# Patient Record
Sex: Female | Born: 1935 | Race: Black or African American | Hispanic: No | Smoking: Never smoker
Health system: Southern US, Community
[De-identification: ages and names within clinical notes are randomized; demographics above are authoritative.]

## PROBLEM LIST (undated history)

## (undated) DIAGNOSIS — I5030 Unspecified diastolic (congestive) heart failure: Secondary | ICD-10-CM

## (undated) DIAGNOSIS — Z923 Personal history of irradiation: Secondary | ICD-10-CM

## (undated) DIAGNOSIS — E119 Type 2 diabetes mellitus without complications: Secondary | ICD-10-CM

## (undated) DIAGNOSIS — I10 Essential (primary) hypertension: Secondary | ICD-10-CM

## (undated) DIAGNOSIS — M1990 Unspecified osteoarthritis, unspecified site: Secondary | ICD-10-CM

## (undated) HISTORY — DX: Unspecified osteoarthritis, unspecified site: M19.90

## (undated) HISTORY — PX: HERNIA REPAIR: SHX51

---

## 1958-06-27 HISTORY — PX: HYSTERECTOMY ABDOMINAL WITH SALPINGO-OOPHORECTOMY: SHX6792

## 1998-01-19 ENCOUNTER — Encounter: Admission: RE | Admit: 1998-01-19 | Discharge: 1998-01-19 | Payer: Self-pay | Admitting: Internal Medicine

## 1998-07-03 ENCOUNTER — Encounter: Admission: RE | Admit: 1998-07-03 | Discharge: 1998-07-03 | Payer: Self-pay | Admitting: Internal Medicine

## 1998-11-11 ENCOUNTER — Encounter: Admission: RE | Admit: 1998-11-11 | Discharge: 1998-11-11 | Payer: Self-pay | Admitting: Internal Medicine

## 1999-01-06 ENCOUNTER — Other Ambulatory Visit: Admission: RE | Admit: 1999-01-06 | Discharge: 1999-01-06 | Payer: Self-pay | Admitting: Gastroenterology

## 1999-01-06 ENCOUNTER — Encounter (INDEPENDENT_AMBULATORY_CARE_PROVIDER_SITE_OTHER): Payer: Self-pay | Admitting: Specialist

## 1999-06-03 ENCOUNTER — Encounter: Admission: RE | Admit: 1999-06-03 | Discharge: 1999-06-03 | Payer: Self-pay | Admitting: Hematology and Oncology

## 1999-09-01 ENCOUNTER — Encounter: Admission: RE | Admit: 1999-09-01 | Discharge: 1999-09-01 | Payer: Self-pay | Admitting: Hematology and Oncology

## 1999-11-18 ENCOUNTER — Encounter: Admission: RE | Admit: 1999-11-18 | Discharge: 1999-11-18 | Payer: Self-pay | Admitting: Internal Medicine

## 1999-12-02 ENCOUNTER — Ambulatory Visit (HOSPITAL_COMMUNITY): Admission: RE | Admit: 1999-12-02 | Discharge: 1999-12-02 | Payer: Self-pay | Admitting: Internal Medicine

## 1999-12-02 ENCOUNTER — Encounter: Admission: RE | Admit: 1999-12-02 | Discharge: 1999-12-02 | Payer: Self-pay | Admitting: Internal Medicine

## 1999-12-02 ENCOUNTER — Encounter: Payer: Self-pay | Admitting: Internal Medicine

## 1999-12-30 ENCOUNTER — Encounter: Admission: RE | Admit: 1999-12-30 | Discharge: 1999-12-30 | Payer: Self-pay | Admitting: Hematology and Oncology

## 2000-04-17 ENCOUNTER — Encounter: Admission: RE | Admit: 2000-04-17 | Discharge: 2000-04-17 | Payer: Self-pay | Admitting: Internal Medicine

## 2000-07-14 ENCOUNTER — Encounter: Admission: RE | Admit: 2000-07-14 | Discharge: 2000-07-14 | Payer: Self-pay | Admitting: Hematology and Oncology

## 2000-07-18 ENCOUNTER — Encounter: Admission: RE | Admit: 2000-07-18 | Discharge: 2000-07-18 | Payer: Self-pay | Admitting: Internal Medicine

## 2000-10-05 ENCOUNTER — Encounter: Admission: RE | Admit: 2000-10-05 | Discharge: 2000-10-05 | Payer: Self-pay

## 2000-12-07 ENCOUNTER — Encounter: Admission: RE | Admit: 2000-12-07 | Discharge: 2000-12-07 | Payer: Self-pay | Admitting: Internal Medicine

## 2000-12-18 ENCOUNTER — Encounter: Admission: RE | Admit: 2000-12-18 | Discharge: 2000-12-18 | Payer: Self-pay | Admitting: Internal Medicine

## 2000-12-21 ENCOUNTER — Ambulatory Visit (HOSPITAL_COMMUNITY): Admission: RE | Admit: 2000-12-21 | Discharge: 2000-12-21 | Payer: Self-pay | Admitting: Internal Medicine

## 2000-12-21 ENCOUNTER — Encounter: Payer: Self-pay | Admitting: Internal Medicine

## 2001-01-08 ENCOUNTER — Encounter: Admission: RE | Admit: 2001-01-08 | Discharge: 2001-01-08 | Payer: Self-pay | Admitting: Internal Medicine

## 2001-02-09 ENCOUNTER — Encounter: Admission: RE | Admit: 2001-02-09 | Discharge: 2001-02-09 | Payer: Self-pay | Admitting: Internal Medicine

## 2001-02-23 ENCOUNTER — Encounter: Payer: Self-pay | Admitting: Emergency Medicine

## 2001-02-23 ENCOUNTER — Inpatient Hospital Stay (HOSPITAL_COMMUNITY): Admission: EM | Admit: 2001-02-23 | Discharge: 2001-02-24 | Payer: Self-pay | Admitting: Emergency Medicine

## 2001-05-23 ENCOUNTER — Encounter: Admission: RE | Admit: 2001-05-23 | Discharge: 2001-05-23 | Payer: Self-pay | Admitting: Internal Medicine

## 2001-06-27 HISTORY — PX: HERNIA REPAIR: SHX51

## 2001-11-12 ENCOUNTER — Encounter: Admission: RE | Admit: 2001-11-12 | Discharge: 2001-11-12 | Payer: Self-pay | Admitting: Internal Medicine

## 2001-12-20 ENCOUNTER — Ambulatory Visit (HOSPITAL_COMMUNITY): Admission: RE | Admit: 2001-12-20 | Discharge: 2001-12-20 | Payer: Self-pay | Admitting: Internal Medicine

## 2002-04-15 ENCOUNTER — Encounter: Admission: RE | Admit: 2002-04-15 | Discharge: 2002-04-15 | Payer: Self-pay | Admitting: Internal Medicine

## 2002-12-02 ENCOUNTER — Encounter: Admission: RE | Admit: 2002-12-02 | Discharge: 2002-12-02 | Payer: Self-pay | Admitting: Internal Medicine

## 2004-08-03 ENCOUNTER — Encounter: Admission: RE | Admit: 2004-08-03 | Discharge: 2004-08-03 | Payer: Self-pay | Admitting: Internal Medicine

## 2006-08-20 ENCOUNTER — Inpatient Hospital Stay (HOSPITAL_COMMUNITY): Admission: EM | Admit: 2006-08-20 | Discharge: 2006-08-22 | Payer: Self-pay | Admitting: Emergency Medicine

## 2006-09-08 ENCOUNTER — Inpatient Hospital Stay (HOSPITAL_COMMUNITY): Admission: RE | Admit: 2006-09-08 | Discharge: 2006-09-10 | Payer: Self-pay | Admitting: General Surgery

## 2006-11-13 ENCOUNTER — Encounter: Admission: RE | Admit: 2006-11-13 | Discharge: 2006-11-13 | Payer: Self-pay | Admitting: Orthopedic Surgery

## 2010-07-17 ENCOUNTER — Encounter: Payer: Self-pay | Admitting: Internal Medicine

## 2010-11-12 NOTE — H&P (Signed)
Megan Ryan, Megan Ryan               ACCOUNT NO.:  [id number]   MEDICAL RECORD NO.:  [id number]          PATIENT TYPE:  INP   LOCATION:  1618                         FACILITY:  [facility]   PHYSICIAN:  Adolph Pollack, M.D.DATE OF BIRTH:  1936/01/22   DATE OF ADMISSION:  09/08/2006  DATE OF DISCHARGE:                              HISTORY & PHYSICAL   REASON FOR ADMISSION:  Elective repair of ventral incisional hernia.   HISTORY OF PRESENT ILLNESS:  Ms. Sokolow is a 75 year old female who has  had lower abdominal surgeries in the past.  She was admitted on  February23,as she was having some sharp lower abdominal pain with  dry heaves.  She underwent a CT scan which demonstrated a ventral  incisional hernia containing some small bowel with the point of small-  bowel obstruction being at this level.  A Nasogastric tube was placed.  She was seen in the emergency department; and on examination the hernia  was reduced.  She subsequently was observed, briefly, in the hospital  and did well; and now has come back for elective procedure having had a  gentle bowel prep.   PAST MEDICAL HISTORY:  1. Hypertension.  2. Degenerative joint disease.   PREVIOUS OPERATIONS:  1. Abdominal oophorectomy.  2. Abdominal hysterectomy.   ALLERGIES:  None.   MEDICATIONS:  HCTZ and Darvocet.   SOCIAL HISTORY:  She lives alone.  She is a TEFL teacher Witness and does  not wish to receive blood transfusions under any circumstances.  No  tobacco, alcohol use.   REVIEW OF SYSTEMS:  Essentially unremarkable with respect to cardiac  disease, chronic pulmonary disease, or diabetes.   PHYSICAL EXAM:  GENERAL:  An obese female who is in no acute distress,  pleasant, and cooperative.  EYES:  Extraocular motions intact.  There is no icterus.  LUNGS:  Clear auscultation.  CARDIOVASCULAR:  Heart demonstrates a regular rate, regular rhythm, no  murmur heard.  ABDOMEN:  Obese.  There is a fairly significant size  pannus.  There is  lower midline incision well healed.  Below the level of the umbilicus  and just to the left, there is a fullness that is reducible.  EXTREMITIES:  SCDs hose are on.   IMPRESSION:  Ventral incisional hernia with one episode of incarceration  and bowel obstruction that resolved rapidly after reduction of the  hernia.   PLAN:  Laparoscopic, possible open repair.  I have discussed the  procedure, and the risks with her at length.  The risks; include, but  are not limited to bleeding, infection, wound healing problems,  anesthesia, recurrence, and accidental damage to the intra-abdominal  organs.      Adolph Pollack, M.D.  Electronically Signed     TJR/MEDQ  D:  09/08/2006  T:  09/09/2006  Job:  [id number]

## 2010-11-12 NOTE — Discharge Summary (Signed)
NAMENGOC, DETJEN               ACCOUNT NO.:  [id number]   MEDICAL RECORD NO.:  [id number]          PATIENT TYPE:  INP   LOCATION:  1618                         FACILITY:  [facility]   PHYSICIAN:  Adolph Pollack, M.D.DATE OF BIRTH:  [birth year]   DATE OF ADMISSION:  09/08/2006  DATE OF DISCHARGE:  09/10/2006                               DISCHARGE SUMMARY   PRINCIPAL DISCHARGE DIAGNOSIS:  Ventral incisional hernia.   SECONDARY DIAGNOSES:  1. Hypertension.  2. Degenerative joint disease.   PROCEDURE:  Laparoscopic repair of incarcerated ventral incisional  hernia with mesh.   INDICATION:  Ms. Tilson is a 75 year old female who went to the  hospital with some abdominal pain and nausea and vomiting back in  February and was felt to potentially have an acutely incarcerated  incisional hernia, which was reduced in the emergency department.  She  subsequently was observed and was able to go home and have a bowel prep  and now returns for an elective operation.   HOSPITAL COURSE:  She underwent the above laparoscopic repair of an  incarcerated ventral incisional hernia on September 08, 2006.  She tolerated  this well.  The first post-op day, she still required intravenous pain  medication.  However, by the second day, she was improved, able to have  her pain controlled by oral medication and was able to be discharged.   DISPOSITION:  Discharged to home in satisfactory condition on September 10, 2006.  She was given discharge instructions.  She is to continue her  usual medications and was given a prescription for Tylox for pain.  She  will call if she has any problems and then also to make an appointment  in 2 weeks for followup.      Adolph Pollack, M.D.  Electronically Signed     TJR/MEDQ  D:  09/27/2006  T:  09/27/2006  Job:  [id number]   cc:   Fleet Contras, M.D.  Fax: [phone]

## 2010-11-12 NOTE — Discharge Summary (Signed)
[name], Megan Ryan               ACCOUNT NO.:  [id number]   MEDICAL RECORD NO.:  [id number]          PATIENT TYPE:  INP   LOCATION:  5029                         FACILITY:  [facility]   PHYSICIAN:  Adolph Pollack, M.D.DATE OF BIRTH:  04-Mar-1936   DATE OF ADMISSION:  08/19/2006  DATE OF DISCHARGE:  08/22/2006                               DISCHARGE SUMMARY   PRINCIPAL DISCHARGE DIAGNOSIS:  Partial small bowel obstruction  secondary to incarcerated ventral hernia.   SECONDARY DIAGNOSES:  1. Obesity.  2. Hypertension.  3. Degenerative joint disease.   PROCEDURES:  None.   REASON FOR ADMISSION:  This 75 year old female had been doing some  housework and pushing a vacuum when on August 19, 2006 at 3 p.m. she  had abrupt onset of abdominal pain with dry heaves and a very firm area  to the left of the umbilicus.  She presented to the hospital at [facility] where she was evaluated, and a CT scan demonstrated small bowel  obstruction secondary to incarcerated ventral hernia.  Dr. Darnell Level  saw her in the emergency department and with gentle palpation reduced  the hernia, and the patient's symptoms immediately resolved.  She was  watched overnight and the next and started on a liquid diet and advanced  to a solid diet.  I have offered to repair her hernia here in the  hospital or let her go home and have it repaired laparoscopically  electively.  On August 21, 2006, she chose to go ahead and go home and  come back for elective repair.  However, the next morning she thought  she may have changed her mind and might want the repair done while she  was here.  However, I had gone ahead and given her a regular diet, and  it was explained to her I would need a two-day bowel preparation 2-1/2  hours, and I felt that now this is more of an elective issue than an  urgent issue.  She is completely asymptomatic, tolerating her diet and  moving her bowels.  I had a long discussion with her,  and I went over  the procedure and the risks of a laparoscopic possible open ventral  hernia repair.  The risks include but are not limited to bleeding,  infection, wound healing problem, anesthesia, recurrence, and accidental  damage to intra-abdominal organs.  We also discussed her having a gentle  two-day bowel preparation with clear liquids for two days followed by a  bottle of magnesium citrate two days beforehand and one bottle b.i.d.  the day before surgery.  I have explained this to her in detail with her  instructions.  I told her I would have my office call with the scheduled  date.  If she has any recurrent symptoms, she is to call right away.   DISPOSITION:  Discharged home in satisfactory condition.  Activity is  limited to no heavy lifting over ten pounds, no indoor or outdoor work.  Diet and home medications are the same.  She is told to call for any  problems or questions.  Adolph Pollack, M.D.  Electronically Signed    TJR/MEDQ  D:  08/22/2006  T:  08/22/2006  Job:  [id number]

## 2010-11-12 NOTE — Op Note (Signed)
Megan Ryan, Megan Ryan               ACCOUNT NO.:  [id number]   MEDICAL RECORD NO.:  [id number]          PATIENT TYPE:  INP   LOCATION:  1618                         FACILITY:  [facility]   PHYSICIAN:  Adolph Pollack, M.D.DATE OF BIRTH:  Oct 10, 1935   DATE OF PROCEDURE:  09/08/2006  DATE OF DISCHARGE:                               OPERATIVE REPORT   PREOPERATIVE DIAGNOSIS:  Incarcerated ventral incisional hernia.   POSTOPERATIVE DIAGNOSIS:  Incarcerated ventral incisional hernia.   PROCEDURE:  Laparoscopic repair of incarcerated ventral incisional  hernia with Proceed with mesh.   SURGEON:  Adolph Pollack, M.D.   ASSISTANT:  Currie Paris, M.D.   ANESTHESIA:  General.   INDICATIONS:  A 75 year old female with an incarcerated ventral  incisional hernia that had been reduced.  She now presents for  laparoscopic repair.   TECHNIQUE:  She is seen in the holding area and brought to the operating  room and placed supine on the operating table.  A general anesthetic was  administered.  A Foley catheter was placed in the bladder.  The  abdominal wall was sterilely prepped and draped.  In the right upper  quadrant I made an incision through the skin and subcutaneous tissue.  She was very obese.  I made an incision in two fascial layers and then I  placed an 11 mm trocar with an OptiVu and the laparoscope and trocar and  gently worked my way through the next two layers entering the peritoneal  cavity.  I then insufflated CO2 gas.  I inspected the area underneath  the trocar very well and did not notice any solid organ or intestinal  injury.  Following this, I noted the hernia defect which was in the left  periumbilical region.  There were also adhesions from a lower midline  incision to the anterior abdominal wall.  A 5 mm trocar was then placed  in the right lower quadrant and using sharp dissection, I began lysing  adhesions and exposing the area of the hernia.  I then placed  a 5 mm  trocar in the left upper quadrant.  I used the harmonic scalpel to  divide some of the omentum and control some bleeding from the omentum.  The bowel had already been reduced and basically, there was omentum  incarcerated up in the hernia which I reduced.  I then divided the sac  with the harmonic scalpel exposing a normal fascial area around the  hernia.  This took about one hour to perform the adhesiolysis.   I then inspected the area and saw no intestinal injury and no bleeding  from the omentum.  Spinal needles were then placed around the edges of  the hernia in four quadrants and 3-4 cm marked back to them creating an  adequate overlap.  A piece of Proceed mesh was brought into the field  and cut to the appropriate size.  Four anchoring sutures of #1 Novofil  were placed in four quadrants of the Proceed mesh.  The Proceed mesh was  then placed into the abdominal cavity with the blue side facing  up and  the nonadherent side facing the viscera.  Four stab wounds were made in  the four quadrants around the periumbilical region.  The anchoring  sutures were then pulled up across the fascial bridge and tied down,  initially anchoring the mesh to the fascia.  Using the spiral tacker, I  then anchored the periphery of the mesh to the muscle with both an inner  and outer layer.  This more than adequately covered the defect with  adequate overlap.   I reinspected the abdominal cavity and no bleeding was noted and no  visceral injury was noted.  I then removed all but one trocar and  watched the mesh approximate basically omentum.  That trocar was removed  and pneumoperitoneum was released.  The skin incisions were then all  closed with 4-0 Monocryl subcuticular stitches.  Steri-Strips and  sterile dressings were applied.  She tolerated the procedure well  without any apparent complications and was taken to recovery in  satisfactory condition.      Adolph Pollack, M.D.   Electronically Signed     TJR/MEDQ  D:  09/08/2006  T:  09/09/2006  Job:  [id number]

## 2010-11-12 NOTE — H&P (Signed)
NAMEEILENE, VOIGT               ACCOUNT NO.:  [id number]   MEDICAL RECORD NO.:  [id number]          PATIENT TYPE:  EMS   LOCATION:  URG                          FACILITY:  [facility]   PHYSICIAN:  Velora Heckler, MD      DATE OF BIRTH:  June 11, 1936   DATE OF ADMISSION:  08/19/2006  DATE OF DISCHARGE:                              HISTORY & PHYSICAL   CHIEF COMPLAINT:  Abdominal pain, nausea, vomiting, incisional hernia.   HISTORY OF PRESENT ILLNESS:  Megan Ryan is a 75 year old black female  from [place], [place] who presents to the emergency department  with onset of sharp abdominal pain.  She had initial pain approximately  3:00 p.m. on February 23.  She had not experienced this discomfort in  the past.  Pain persisted and was followed by onset of dry heaves.  She  denies fevers or chills.  The patient presented to [facility] for assessment.  She was sent to the hospital for a CT scan abdomen  and pelvis which demonstrated a ventral incisional hernia containing  small bowel with small-bowel obstruction.  Nasogastric tube was placed  and general surgery was consulted.   PAST MEDICAL HISTORY:  Status post abdominal hysterectomy 1972, status  post oophorectomy in 1962, history of hypertension, history of  degenerative joint disease.   MEDICATIONS:  Hydrochlorothiazide and Darvocet.   ALLERGIES:  None known.   PRIMARY PHYSICIAN:  Fleet Contras, M.D.   SOCIAL HISTORY:  The patient lives alone in [place].  She does not  smoke.  She does not drink alcohol.  She is a Scientist, product/process development.  She  acknowledges that she does not wish to receive blood transfusions under  any circumstances.   FAMILY HISTORY:  Noncontributory.   REVIEW OF SYSTEMS:  A 15-system review without other significant  finding.  Specifically no history of diabetes.  No history of cardiac  disease.   PHYSICAL EXAMINATION:  GENERAL:  A 75 year old morbidly obese black  female on a stretcher  in the emergency department.  VITAL SIGNS:  Temperature 97.2, pulse 58, respirations 18, blood  pressure 115/62.  HEENT:  Shows her to be normocephalic, atraumatic.  Sclerae clear.  Conjunctiva clear.  Pupils equal and reactive.  Dentition fair.  Mucous  membranes moist.  Nasogastric tube is in the right naris with a small  amount of greenish fluid present within the tube.  LUNGS:  Clear to auscultation bilaterally.  CARDIAC EXAM:  Shows regular rate and rhythm without murmur.  Peripheral  pulses are full.  ABDOMEN:  Obese.  There is a large pannus.  There is a well-healed lower  incision.  Palpation reveals a mass just to the left and below the level  of the umbilicus.  This is mildly tender.  With gentle manipulation and  compression.  There is a rapid decompression of the mass, with full  resolution, representing reduction of the hernia.  This is followed by  relief of discomfort.  The remainder of the abdomen is soft and  nontender.  Bowel sounds are present on auscultation.  EXTREMITIES:  Nontender without edema.  NEUROLOGICALLY:  The patient is alert and oriented without focal  neurologic deficit.   LABORATORY STUDIES:  White count 7.4, hemoglobin 13.7, hematocrit 41.8%,  platelet count 233,000.  Differential is normal.  Liver function tests  are normal.   RADIOGRAPHIC STUDIES:  CT scan abdomen and pelvis reviewed in the  emergency department showing ventral incisional hernia containing small  bowel with a transition point at the level of the hernia with proximal  dilatation and fluid within the small bowel.  No sign of perforation.  No free air.   IMPRESSION:  Ventral incisional hernia with small-bowel obstruction.   PLAN:  Hernia was manually reduced in the emergency department with  symptomatic improvement.  The patient will be admitted on the general  surgical service.  Nasogastric tube will be kept to suction.  She will  be maintained n.p.o. and receive intravenous  fluid hydration.  Repeat  abdominal examination will be performed.  If the patient appears to have  resolution of her small-bowel obstruction.  She may progress and be  allowed to be discharged and then return for hernia repair on a semi-  elective basis.  If the patient's symptoms persist, or if she  deteriorates clinically, she will require urgent operation during this  hospitalization.  I discussed this with the patient.  She understands  and is in agreement with this plan.      Velora Heckler, MD  Electronically Signed     TMG/MEDQ  D:  08/20/2006  T:  08/20/2006  Job:  [id number]   cc:   Fleet Contras, M.D.

## 2011-11-18 ENCOUNTER — Emergency Department (HOSPITAL_COMMUNITY)
Admission: EM | Admit: 2011-11-18 | Discharge: 2011-11-18 | Disposition: A | Discharge status: Home / Self Care | Payer: Medicare Other | Attending: Emergency Medicine | Admitting: Emergency Medicine

## 2011-11-18 ENCOUNTER — Encounter (HOSPITAL_COMMUNITY): Payer: Self-pay | Admitting: Emergency Medicine

## 2011-11-18 DIAGNOSIS — N898 Other specified noninflammatory disorders of vagina: Secondary | ICD-10-CM | POA: Insufficient documentation

## 2011-11-18 DIAGNOSIS — N939 Abnormal uterine and vaginal bleeding, unspecified: Secondary | ICD-10-CM

## 2011-11-18 LAB — OCCULT BLOOD, POC DEVICE: Fecal Occult Bld: NEGATIVE

## 2011-11-18 LAB — CBC
HCT: 38.3 % (ref 36.0–46.0)
Hemoglobin: 12.2 g/dL (ref 12.0–15.0)
MCHC: 31.9 g/dL (ref 30.0–36.0)
WBC: 5.4 10*3/uL (ref 4.0–10.5)

## 2011-11-18 LAB — BASIC METABOLIC PANEL
CO2: 27 mEq/L (ref 19–32)
Calcium: 9.7 mg/dL (ref 8.4–10.5)
Creatinine, Ser: 0.81 mg/dL (ref 0.50–1.10)
GFR calc Af Amer: 81 mL/min — ABNORMAL LOW (ref >90)

## 2011-11-18 LAB — PROTIME-INR: Prothrombin Time: 13.3 seconds (ref 11.6–15.2)

## 2011-11-18 NOTE — ED Notes (Signed)
Started 2 weeks ago with LLQ Abdominal pain. Pain increased with lifting, comes and goes. Yesterday had pink vaginal bleeding. C/O nausea with out vomiting. Last BM yesterday, normal.

## 2011-11-18 NOTE — ED Notes (Signed)
Pt discharged in good condition.

## 2011-11-18 NOTE — ED Provider Notes (Signed)
History     CSN: [id number]  Arrival date & time 11/18/11  0917   First MD Initiated Contact with Patient 11/18/11 [phone]      Chief Complaint  Patient presents with  . Abdominal Pain    nausea     The history is provided by the patient.   the patient reports she's had intermittent left-sided abdominal pain for about 2 weeks.  Currently she is without any pain.  At this time she has no abdominal pain.  She reports 2 days ago she had heavy vaginal bleeding which is abnormal for her and she feels certain this was not coming from the rectum.  She reports she is status post total hysterectomy in the 1970s.  She denies melena or hematochezia.  She's had no blood in her stool.  She reports yesterday she had a pink discharge from her vagina.  She denies vaginal pain or vaginal itching.  She denies nausea vomiting.  She's had no weight changes.  She has not discussed this with the GYN or her primary care physician  No past medical history on file.  No past surgical history on file.  No family history on file.  History  Substance Use Topics  . Smoking status: Never Smoker   . Smokeless tobacco: Not on file  . Alcohol Use: No    OB History    Grav Para Term Preterm Abortions TAB SAB Ect Mult Living                  Review of Systems  Gastrointestinal: Positive for abdominal pain.  All other systems reviewed and are negative.    Allergies  Review of patient's allergies indicates no known allergies.  Home Medications   Current Outpatient Rx  Name Route Sig Dispense Refill  . ASPIRIN EC 81 MG PO TBEC Oral Take 81 mg by mouth daily.    Marland Kitchen GABAPENTIN 100 MG PO CAPS Oral Take 100 mg by mouth 2 (two) times daily.    . TRIAMTERENE-HCTZ 37.5-25 MG PO TABS Oral Take 0.5 tablets by mouth daily.      BP 165/66  Pulse 72  Temp(Src) 97.7 F (36.5 C) (Oral)  Resp 18  SpO2 100%  Physical Exam  Nursing note and vitals reviewed. Constitutional: She is oriented to person, place, and  time. She appears well-developed and well-nourished. No distress.  HENT:  Head: Normocephalic and atraumatic.  Eyes: EOM are normal.  Neck: Normal range of motion.  Cardiovascular: Normal rate, regular rhythm and normal heart sounds.   Pulmonary/Chest: Effort normal and breath sounds normal.  Abdominal: Soft. She exhibits no distension. There is no tenderness.  Genitourinary:       Normal external genitalia.  Normal speculum exam of the vaginal vault.  The cervix is surgically absent.  No obvious bleeding masses or friable lesions noted.  No blood in vaginal vault.  Rectal exam with normal brown Hemoccult-negative stool.  No gross blood.  No hemorrhoids noted.  Musculoskeletal: Normal range of motion.  Neurological: She is alert and oriented to person, place, and time.  Skin: Skin is warm and dry.  Psychiatric: She has a normal mood and affect. Judgment normal.    ED Course  Procedures (including critical care time)  Labs Reviewed  BASIC METABOLIC PANEL - Abnormal; Notable for the following:    GFR calc non Af Amer 70 (*)    GFR calc Af Amer 81 (*)    All other components within normal limits  CBC  PROTIME-INR  OCCULT BLOOD, POC DEVICE   No results found.   1. Vaginal bleeding       MDM  Unclear etiology of the patient's reported vaginal bleeding the other day.  She no longer has abdominal pain.  She is status post total hysterectomy.  Vaginal and rectal exams in the emergency department are nonrevealing.  The patient be referred to gynecology for followup.  No indication at this time for aggressive imaging.  Imaging her abdomen and pelvis can be completed as an outpatient as deemed necessary by GYN and her PCP        Lyanne Co, MD 11/18/11 1300

## 2011-11-18 NOTE — ED Notes (Signed)
Hx of  Left inguinal hernia repair in 2008 by Dr. Abbey Chatters.

## 2013-03-19 ENCOUNTER — Emergency Department (INDEPENDENT_AMBULATORY_CARE_PROVIDER_SITE_OTHER)
Admission: EM | Admit: 2013-03-19 | Discharge: 2013-03-19 | Disposition: A | Discharge status: Nursing Home (Includes ICF) | Payer: Medicare Other | Source: Home / Self Care | Attending: Emergency Medicine | Admitting: Emergency Medicine

## 2013-03-19 ENCOUNTER — Encounter (HOSPITAL_COMMUNITY): Payer: Self-pay | Admitting: Emergency Medicine

## 2013-03-19 ENCOUNTER — Emergency Department (HOSPITAL_COMMUNITY): Payer: Medicare Other

## 2013-03-19 ENCOUNTER — Emergency Department (HOSPITAL_COMMUNITY)
Admission: EM | Admit: 2013-03-19 | Discharge: 2013-03-19 | Disposition: A | Discharge status: Home / Self Care | Payer: Medicare Other | Attending: Emergency Medicine | Admitting: Emergency Medicine

## 2013-03-19 DIAGNOSIS — R05 Cough: Secondary | ICD-10-CM

## 2013-03-19 DIAGNOSIS — Z7982 Long term (current) use of aspirin: Secondary | ICD-10-CM | POA: Insufficient documentation

## 2013-03-19 DIAGNOSIS — Z79899 Other long term (current) drug therapy: Secondary | ICD-10-CM | POA: Insufficient documentation

## 2013-03-19 DIAGNOSIS — J40 Bronchitis, not specified as acute or chronic: Secondary | ICD-10-CM

## 2013-03-19 DIAGNOSIS — I10 Essential (primary) hypertension: Secondary | ICD-10-CM | POA: Insufficient documentation

## 2013-03-19 DIAGNOSIS — R062 Wheezing: Secondary | ICD-10-CM | POA: Insufficient documentation

## 2013-03-19 HISTORY — DX: Essential (primary) hypertension: I10

## 2013-03-19 LAB — CBC WITH DIFFERENTIAL/PLATELET
Basophils Absolute: 0 10*3/uL (ref 0.0–0.1)
Eosinophils Relative: 1 % (ref 0–5)
Hemoglobin: 10.6 g/dL — ABNORMAL LOW (ref 12.0–15.0)
Lymphocytes Relative: 23 % (ref 12–46)
Neutro Abs: 5.4 10*3/uL (ref 1.7–7.7)
Platelets: 242 10*3/uL (ref 150–400)
RBC: 3.55 MIL/uL — ABNORMAL LOW (ref 3.87–5.11)
RDW: 14.3 % (ref 11.5–15.5)
WBC: 8.2 10*3/uL (ref 4.0–10.5)

## 2013-03-19 LAB — PRO B NATRIURETIC PEPTIDE: Pro B Natriuretic peptide (BNP): 22.4 pg/mL (ref 0–450)

## 2013-03-19 LAB — POCT I-STAT TROPONIN I: Troponin i, poc: 0 ng/mL (ref 0.00–0.08)

## 2013-03-19 LAB — COMPREHENSIVE METABOLIC PANEL
ALT: 30 U/L (ref 0–35)
AST: 25 U/L (ref 0–37)
CO2: 27 mEq/L (ref 19–32)
Calcium: 9.5 mg/dL (ref 8.4–10.5)
Creatinine, Ser: 0.72 mg/dL (ref 0.50–1.10)
GFR calc non Af Amer: 82 mL/min — ABNORMAL LOW (ref >90)
Sodium: 137 mEq/L (ref 135–145)

## 2013-03-19 MED ORDER — NITROGLYCERIN 0.4 MG SL SUBL
SUBLINGUAL_TABLET | SUBLINGUAL | Status: AC
  Filled 2013-03-19: qty 25

## 2013-03-19 MED ORDER — POTASSIUM CHLORIDE CRYS ER 20 MEQ PO TBCR
60.0000 meq | EXTENDED_RELEASE_TABLET | Freq: Once | ORAL | Status: AC
  Administered 2013-03-19: 60 meq via ORAL
  Filled 2013-03-19: qty 3

## 2013-03-19 MED ORDER — ASPIRIN 81 MG PO CHEW
CHEWABLE_TABLET | ORAL | Status: AC
  Filled 2013-03-19: qty 4

## 2013-03-19 MED ORDER — ALBUTEROL SULFATE HFA 108 (90 BASE) MCG/ACT IN AERS: 1.0000 | INHALATION_SPRAY | Freq: Four times a day (QID) | RESPIRATORY_TRACT | Status: DC | PRN

## 2013-03-19 MED ORDER — PREDNISONE 10 MG PO TABS: 40.0000 mg | ORAL_TABLET | Freq: Every day | ORAL | Status: AC

## 2013-03-19 MED ORDER — SODIUM CHLORIDE 0.9 % IV SOLN
INTRAVENOUS | Status: DC
  Administered 2013-03-19: 50 mL via INTRAVENOUS

## 2013-03-19 MED ORDER — PREDNISONE 20 MG PO TABS
40.0000 mg | ORAL_TABLET | ORAL | Status: AC
Start: 2013-03-19 — End: 2013-03-19
  Administered 2013-03-19: 40 mg via ORAL
  Filled 2013-03-19: qty 2

## 2013-03-19 MED ORDER — ASPIRIN 81 MG PO CHEW
324.0000 mg | CHEWABLE_TABLET | Freq: Once | ORAL | Status: AC
  Administered 2013-03-19: 324 mg via ORAL

## 2013-03-19 MED ORDER — ALBUTEROL SULFATE (5 MG/ML) 0.5% IN NEBU
2.5000 mg | INHALATION_SOLUTION | Freq: Once | RESPIRATORY_TRACT | Status: AC
  Administered 2013-03-19: 2.5 mg via RESPIRATORY_TRACT
  Filled 2013-03-19: qty 0.5

## 2013-03-19 MED ORDER — NITROGLYCERIN 0.4 MG SL SUBL
0.4000 mg | SUBLINGUAL_TABLET | SUBLINGUAL | Status: AC | PRN
  Administered 2013-03-19: 0.4 mg via SUBLINGUAL

## 2013-03-19 NOTE — ED Notes (Signed)
Report given to [facility] and to [place] Engineer, manufacturing systems)

## 2013-03-19 NOTE — ED Notes (Signed)
Pt c/o cough and chest tightness x 6 days. Pt went to urgent care and EKG showed T wave inversion so pt was sent here. Pt denies any pain, only has sob when she coughs along with the tightness. Pt was given 1 Nitro and 324 ASA. 20g RH.

## 2013-03-19 NOTE — ED Provider Notes (Signed)
CSN: [id number]     Arrival date & time 03/19/13  1209 History   First MD Initiated Contact with Patient 03/19/13 1217     Chief Complaint  Patient presents with  . Chest Pain   (Consider location/radiation/quality/duration/timing/severity/associated sxs/prior Treatment) HPI Patient presents from urgent care where she initially presented with concerns of cough and chest tightness. Symptoms began 6 days ago without clear precipitant.  Since onset symptoms have been persistent, with no relief from OTC medication or any activity. The chest pressure is mostly associated with coughing, but is present throughout. Pressures anterior, tight. There is no exertional or pleuritic complaints. There is no associated lightheadedness, syncope, vomiting, diarrhea, though there is urinary frequency. Patient states that she was in her usual state of health prior to the onset of symptoms. She denies a history of cardiac or pulmonary disease.  Past Medical History  Diagnosis Date  . Hypertension    History reviewed. No pertinent past surgical history. No family history on file. History  Substance Use Topics  . Smoking status: Never Smoker   . Smokeless tobacco: Not on file  . Alcohol Use: No   OB History   Grav Para Term Preterm Abortions TAB SAB Ect Mult Living                 Review of Systems  Constitutional:       Per HPI, otherwise negative  HENT:       Per HPI, otherwise negative  Respiratory:       Per HPI, otherwise negative  Cardiovascular:       Per HPI, otherwise negative  Gastrointestinal: Negative for vomiting.  Endocrine:       Negative aside from HPI  Genitourinary:       Neg aside from HPI   Musculoskeletal:       Per HPI, otherwise negative  Skin: Negative.   Neurological: Negative for syncope.    Allergies  Review of patient's allergies indicates no known allergies.  Home Medications   Current Outpatient Rx  Name  Route  Sig  Dispense  Refill  . aspirin EC  81 MG tablet   Oral   Take 81 mg by mouth daily.         Marland Kitchen OVER THE COUNTER MEDICATION   Oral   Take 15 mLs by mouth 4 (four) times daily as needed (cold).         Marland Kitchen tobramycin (TOBREX) 0.3 % ophthalmic solution   Both Eyes   Place 1 drop into both eyes every 4 (four) hours.         . triamterene-hydrochlorothiazide (MAXZIDE-25) 37.5-25 MG per tablet   Oral   Take 0.5 tablets by mouth daily.          BP 123/72  Pulse 83  Temp(Src) 98.8 F (37.1 C) (Oral)  Resp 15  SpO2 100% Physical Exam  Nursing note and vitals reviewed. Constitutional: She is oriented to person, place, and time. She appears well-developed and well-nourished. No distress.  HENT:  Head: Normocephalic and atraumatic.  Eyes: Conjunctivae and EOM are normal.  Cardiovascular: Normal rate and regular rhythm.   Pulmonary/Chest: Effort normal. No stridor. No respiratory distress. She has decreased breath sounds. She has wheezes.  Abdominal: She exhibits no distension.  Musculoskeletal: She exhibits no edema.  Neurological: She is alert and oriented to person, place, and time. No cranial nerve deficit.  Skin: Skin is warm and dry.  Psychiatric: She has a normal mood and affect.  ED Course  Procedures (including critical care time) Labs Review Labs Reviewed  CBC WITH DIFFERENTIAL  COMPREHENSIVE METABOLIC PANEL  PRO B NATRIURETIC PEPTIDE  URINALYSIS, ROUTINE W REFLEX MICROSCOPIC   Imaging Review No results found. I reviewed the patient's chart from earlier today, including presentation to urgent care. Today, on cardiac monitor she has sinus rhythm, rate 85, normal Oximetry 97% on room air this is normal EKG has sinus rhythm, rate 91, T wave inversions anteriorly, with prominent P waves.  This is abnormal   4:27 PM On re-exam the patient is sitting upright, drinking water. BP 140/100. I discussed all findings with her and her family.  MDM  No diagnosis found. This is a pleasant female with  history of hypertension, but no history of CAD now presents with one week of cough, dyspnea, cough associated chest tightness.  On exam the patient is initially wheezing, but is not hypoxic or tachypneic.  Patient's labs are largely reassuring, though there is evidence of    hypokalemia.  Otherwise, troponin was negative, and with her symptoms for greater than one day this likely reflects the absence of ongoing coronary ischemia.  In addition, the patient has no significant chest pain is not associated with coughing. The patient's respiratory status was stable, and improved with albuterol.  I had a lengthy discussion with patient and her family members about the findings, the provisional diagnosis of bronchitis, but he did follow up with her primary care physician, both for appropriate management, and for outpatient coronary testing as she has none to date however, there is little suspicion for occult ACS given the absence of distress, and without evidence of pneumonia or other acute abnormalities, the patient is appropriate for further evaluation and management as an outpatient.    Gerhard Munch, MD 03/19/13 [phone]

## 2013-03-19 NOTE — ED Notes (Signed)
Pt states the Nitro she was given at urgent care has not relieved her chest tightness. Pt states she only has the tightness and sob when she coughs.

## 2013-03-19 NOTE — ED Provider Notes (Signed)
Chief Complaint:   Chief Complaint  Patient presents with  . Cough    History of Present Illness:   Megan Ryan is a 77 year old female with hypertension who has had a six-day history of a cough productive of white sputum, chest tightness, and wheezing. She also describes diaphoresis and some shortness of breath but no nausea and she has not had any chest pain. She has no cardiac history. She also has some nasal congestion, rhinorrhea, sore throat, watering of her eyes, and has had cough incontinence of urine. She states her urine has been thick but denies any burning. She denies any fever, chills, or GI symptoms.  Review of Systems:  Other than noted above, the patient denies any of the following symptoms. Systemic:  No fever, chills, sweats, or fatigue. ENT:  No nasal congestion, rhinorrhea, or sore throat. Pulmonary:  No cough, wheezing, shortness of breath, sputum production, hemoptysis. Cardiac:  No palpitations, rapid heartbeat, dizziness, presyncope or syncope. GI:  No abdominal pain, heartburn, nausea, or vomiting. Ext:  No leg pain or swelling.  PMFSH:  Past medical history, family history, social history, meds, and allergies were reviewed and updated as needed. She has no known medication allergies. She takes triamterene/HCTZ for high blood pressure and also has arthritis, asthma, and allergies.  Physical Exam:   Vital signs:  BP 132/62  Pulse 97  Temp(Src) 98.6 F (37 C) (Oral)  Resp 16  SpO2 99% Gen:  Alert, oriented, in no distress, skin warm and dry. Eye:  PERRL, lids and conjunctivas normal.  Sclera non-icteric. ENT:  Mucous membranes moist, pharynx clear. Neck:  Supple, no adenopathy or tenderness.  No JVD. Lungs:  Clear to auscultation, no wheezes, rales or rhonchi.  No respiratory distress. Heart:  Regular rhythm.  No gallops, murmers, clicks or rubs. Chest:  No chest wall tenderness. Abdomen:  Soft, nontender, no organomegaly or mass.  Bowel sounds normal.  No  pulsatile abdominal mass or bruit. Ext:  No edema.  No calf tenderness and Homann's sign negative.  Pulses full and equal. Skin:  Warm and dry.  No rash.  EKG:   Date: 03/19/2013  Rate: 91  Rhythm: normal sinus rhythm  QRS Axis: normal  Intervals: normal  ST/T Wave abnormalities: nonspecific T wave changes  Conduction Disutrbances:none  Narrative Interpretation: Normal sinus rhythm, she has Q waves in leads III and aVF. She also has T-wave inversions in leads V1 through V5.  Old EKG Reviewed: none available  Course in Urgent Care Center:   Started on normal saline IV at 50 mL per hour, she was given aspirin 325 mg by mouth and nitroglycerin 0.4 mg sublingually. She was started on oxygen at 2 L per minute via nasal cannula and monitor.  Assessment:  The encounter diagnosis was Cough.  Possible anginal equivalent.  Plan:   The patient was transferred to the ED via [facility] in stable condition.  Medical Decision Making:  77 year old female presents with 6 day history of productive cough, chest tightness, and wheezing.  No chest pain, has had diaphoresis, is short of breath, no nausea.  Her EKG showed T inversion in V1 through V5.  My suspicion is that this is an anginal equivalent.  We will give TNG, ASA, IV NS, O2, and moniter.        Reuben Likes, MD 03/19/13 1113

## 2013-03-19 NOTE — ED Notes (Signed)
Pt c/o a productive cough onset last Thursday Sxs also include frequent urination and wheezing Denies: SOB, f/v/n/d She is alert w/no signs of acute distress.

## 2014-09-10 DIAGNOSIS — E784 Other hyperlipidemia: Secondary | ICD-10-CM | POA: Diagnosis not present

## 2014-09-10 DIAGNOSIS — J42 Unspecified chronic bronchitis: Secondary | ICD-10-CM | POA: Diagnosis not present

## 2014-09-10 DIAGNOSIS — R7301 Impaired fasting glucose: Secondary | ICD-10-CM | POA: Diagnosis not present

## 2014-09-10 DIAGNOSIS — I10 Essential (primary) hypertension: Secondary | ICD-10-CM | POA: Diagnosis not present

## 2014-11-18 DIAGNOSIS — H401232 Low-tension glaucoma, bilateral, moderate stage: Secondary | ICD-10-CM | POA: Diagnosis not present

## 2014-11-18 DIAGNOSIS — H2513 Age-related nuclear cataract, bilateral: Secondary | ICD-10-CM | POA: Diagnosis not present

## 2014-11-18 DIAGNOSIS — H47213 Primary optic atrophy, bilateral: Secondary | ICD-10-CM | POA: Diagnosis not present

## 2014-12-10 DIAGNOSIS — R7301 Impaired fasting glucose: Secondary | ICD-10-CM | POA: Diagnosis not present

## 2014-12-10 DIAGNOSIS — J42 Unspecified chronic bronchitis: Secondary | ICD-10-CM | POA: Diagnosis not present

## 2014-12-10 DIAGNOSIS — J069 Acute upper respiratory infection, unspecified: Secondary | ICD-10-CM | POA: Diagnosis not present

## 2014-12-10 DIAGNOSIS — M179 Osteoarthritis of knee, unspecified: Secondary | ICD-10-CM | POA: Diagnosis not present

## 2014-12-10 DIAGNOSIS — I10 Essential (primary) hypertension: Secondary | ICD-10-CM | POA: Diagnosis not present

## 2015-01-12 DIAGNOSIS — H401232 Low-tension glaucoma, bilateral, moderate stage: Secondary | ICD-10-CM | POA: Diagnosis not present

## 2015-04-10 DIAGNOSIS — J42 Unspecified chronic bronchitis: Secondary | ICD-10-CM | POA: Diagnosis not present

## 2015-04-10 DIAGNOSIS — E784 Other hyperlipidemia: Secondary | ICD-10-CM | POA: Diagnosis not present

## 2015-04-10 DIAGNOSIS — R7301 Impaired fasting glucose: Secondary | ICD-10-CM | POA: Diagnosis not present

## 2015-04-10 DIAGNOSIS — M179 Osteoarthritis of knee, unspecified: Secondary | ICD-10-CM | POA: Diagnosis not present

## 2015-04-10 DIAGNOSIS — I10 Essential (primary) hypertension: Secondary | ICD-10-CM | POA: Diagnosis not present

## 2015-05-15 DIAGNOSIS — H04123 Dry eye syndrome of bilateral lacrimal glands: Secondary | ICD-10-CM | POA: Diagnosis not present

## 2015-05-15 DIAGNOSIS — H401232 Low-tension glaucoma, bilateral, moderate stage: Secondary | ICD-10-CM | POA: Diagnosis not present

## 2015-05-15 DIAGNOSIS — H26491 Other secondary cataract, right eye: Secondary | ICD-10-CM | POA: Diagnosis not present

## 2015-06-05 DIAGNOSIS — H401232 Low-tension glaucoma, bilateral, moderate stage: Secondary | ICD-10-CM | POA: Diagnosis not present

## 2015-07-11 ENCOUNTER — Emergency Department (INDEPENDENT_AMBULATORY_CARE_PROVIDER_SITE_OTHER): Payer: Medicare Other

## 2015-07-11 ENCOUNTER — Emergency Department (INDEPENDENT_AMBULATORY_CARE_PROVIDER_SITE_OTHER)
Admission: EM | Admit: 2015-07-11 | Discharge: 2015-07-11 | Disposition: A | Discharge status: Home / Self Care | Payer: Medicare Other | Source: Home / Self Care | Attending: Family Medicine | Admitting: Family Medicine

## 2015-07-11 DIAGNOSIS — M545 Low back pain, unspecified: Secondary | ICD-10-CM

## 2015-07-11 DIAGNOSIS — K5900 Constipation, unspecified: Secondary | ICD-10-CM

## 2015-07-11 DIAGNOSIS — R109 Unspecified abdominal pain: Secondary | ICD-10-CM | POA: Diagnosis not present

## 2015-07-11 MED ORDER — OXYCODONE-ACETAMINOPHEN 5-325 MG PO TABS: 1.0000 | ORAL_TABLET | Freq: Four times a day (QID) | ORAL | Status: DC | PRN

## 2015-07-11 MED ORDER — KETOROLAC TROMETHAMINE 30 MG/ML IJ SOLN
INTRAMUSCULAR | Status: AC
  Filled 2015-07-11: qty 1

## 2015-07-11 MED ORDER — KETOROLAC TROMETHAMINE 30 MG/ML IJ SOLN
30.0000 mg | Freq: Once | INTRAMUSCULAR | Status: AC
  Administered 2015-07-11: 30 mg via INTRAMUSCULAR

## 2015-07-11 NOTE — ED Provider Notes (Signed)
CSN: [id number]     Arrival date & time 07/11/15  1404 History   None    No chief complaint on file.  (Consider location/radiation/quality/duration/timing/severity/associated sxs/prior Treatment) Patient is a 80 y.o. female presenting with back pain and constipation. The history is provided by the patient. No language interpreter was used.  Back Pain Location:  Lumbar spine Quality:  Aching Radiates to: radiates to the lower abdomen. Pain severity:  Severe Pain is:  Same all the time Onset quality:  Gradual Duration:  2 weeks Timing:  Constant Progression:  Worsening Chronicity:  Recurrent (Has hx of arthritis) Context: not jumping from heights, not lifting heavy objects, not MVA, not occupational injury and not recent injury   Relieved by: rest  Worsened by:  Ambulation, movement and sitting Ineffective treatments: Laxative, she thought it was due to constipation but it did not work.. She takes Meloxicam. Associated symptoms: abdominal pain   Associated symptoms: no abdominal swelling, no bladder incontinence, no bowel incontinence, no fever, no numbness, no paresthesias, no perianal numbness, no weakness and no weight loss   Associated symptoms comment:  She has issue with constipation Risk factors: obesity   Risk factors: no hx of cancer and no recent surgery   Constipation Severity:  Moderate Time since last bowel movement:  2 weeks Timing:  Constant Progression:  Waxing and waning Context: not dehydration and not dietary changes   Stool description: Stool is hard as rock, no blood in stool. Stool is brown in color. Relieved by:  Nothing Worsened by:  Nothing tried Ineffective treatments:  Laxatives (OTC laxative) Associated symptoms: abdominal pain and back pain   Associated symptoms: no fever     Past Medical History  Diagnosis Date  . Hypertension    No past surgical history on file. No family history on file. Social History  Substance Use Topics  . Smoking  status: Never Smoker   . Smokeless tobacco: Not on file  . Alcohol Use: No   OB History    No data available     Review of Systems  Constitutional: Negative for fever and weight loss.  Respiratory: Negative.   Cardiovascular: Negative.   Gastrointestinal: Positive for abdominal pain and constipation. Negative for bowel incontinence.  Genitourinary: Negative.  Negative for bladder incontinence.  Musculoskeletal: Positive for back pain.  Neurological: Negative for weakness, numbness and paresthesias.  All other systems reviewed and are negative.   Allergies  Review of patient's allergies indicates no known allergies.  Home Medications   Prior to Admission medications   Medication Sig Start Date End Date Taking? Authorizing Provider  albuterol (PROVENTIL HFA;VENTOLIN HFA) 108 (90 BASE) MCG/ACT inhaler Inhale 1-2 puffs into the lungs every 6 (six) hours as needed for wheezing or shortness of breath. 03/19/13   Carmin Muskrat, MD  aspirin EC 81 MG tablet Take 81 mg by mouth daily.    Historical Provider, MD  OVER THE COUNTER MEDICATION Take 15 mLs by mouth 4 (four) times daily as needed (cold).    Historical Provider, MD  tobramycin (TOBREX) 0.3 % ophthalmic solution Place 1 drop into both eyes every 4 (four) hours.    Historical Provider, MD  triamterene-hydrochlorothiazide (MAXZIDE-25) 37.5-25 MG per tablet Take 0.5 tablets by mouth daily.    Historical Provider, MD   Meds Ordered and Administered this Visit  Medications - No data to display  BP 148/84 mmHg  Pulse 76  Temp(Src) 98.6 F (37 C) (Oral)  Resp 20  SpO2 96% No data found.  Physical Exam  Constitutional: She is oriented to person, place, and time. She appears well-developed. No distress.  Cardiovascular: Normal rate, regular rhythm and normal heart sounds.   No murmur heard. Pulmonary/Chest: Effort normal and breath sounds normal. No respiratory distress. She has no wheezes.  Abdominal: Soft. Bowel sounds are  normal. She exhibits no mass. There is no tenderness. There is no rebound and no guarding.  Mild lower abdominal tenderness, no guarding, no rebound tenderness, no palpable mass.  Musculoskeletal: Normal range of motion. She exhibits no edema.  Neurological: She is alert and oriented to person, place, and time.  Nursing note and vitals reviewed.   ED Course  Procedures (including critical care time)  Labs Review Labs Reviewed - No data to display  Imaging Review No results found.   Visual Acuity Review  Right Eye Distance:   Left Eye Distance:   Bilateral Distance:    Right Eye Near:   Left Eye Near:    Bilateral Near:      Dg Lumbar Spine Complete  07/11/2015  CLINICAL DATA:  Lumbago for 1 week EXAM: LUMBAR SPINE - COMPLETE 4+ VIEW COMPARISON:  None. FINDINGS: Frontal, lateral, spot lumbosacral lateral, and bilateral oblique views were obtained. The there are 5 non-rib-bearing lumbar type vertebral bodies. There is slight lumbar levoscoliosis. There is no demonstrable fracture or spondylolisthesis. There is mild disc space narrowing at L3-4 L4-5 with slightly greater disc space narrowing at L5-S1. There is facet osteoarthritic change at L4-5 and L5-S1 bilaterally. There is atherosclerotic calcification in the aorta. There is evidence of postoperative change in the lower abdomen and pelvis. IMPRESSION: Areas of osteoarthritic change. No fracture or spondylolisthesis. Atherosclerotic calcification in aorta. Electronically Signed   By: Lowella Grip III M.D.   On: 07/11/2015 16:16     MDM  No diagnosis found. Bilateral low back pain without sciatica  Abdominal discomfort  Constipation, unspecified constipation type    Low back pain likely due to arthritis. Toradol 30 mg IM x 1 given during this visit. Percocet prescribed prn pain. I recommended PCP f/u in 2-3 days if no improvement. Abdominal pain likely related to her constipation. Miralax recommended. Increase  hydration and high fiber diet recommended. Return precaution discussed. I advised ED visit if symptoms worsens. She verbalized understanding.     Kinnie Feil, MD 07/11/15 [phone]

## 2015-07-11 NOTE — Discharge Instructions (Signed)
It was nice seeing you today,I am sorry about your back pain. Your xray shows that you have arthritis which could be contributing to your pain. Your stomach discomfort might be coming from the constipation. Please use Miralax OTC for constipation. I have prescribed Percocet for your back pain. See you PCP in 2 days. If symptoms worsens please go to the ED.  Abdominal Pain, Adult Many things can cause belly (abdominal) pain. Most times, the belly pain is not dangerous. Many cases of belly pain can be watched and treated at home. HOME CARE   Do not take medicines that help you go poop (laxatives) unless told to by your doctor.  Only take medicine as told by your doctor.  Eat or drink as told by your doctor. Your doctor will tell you if you should be on a special diet. GET HELP IF:  You do not know what is causing your belly pain.  You have belly pain while you are sick to your stomach (nauseous) or have runny poop (diarrhea).  You have pain while you pee or poop.  Your belly pain wakes you up at night.  You have belly pain that gets worse or better when you eat.  You have belly pain that gets worse when you eat fatty foods.  You have a fever. GET HELP RIGHT AWAY IF:   The pain does not go away within 2 hours.  You keep throwing up (vomiting).  The pain changes and is only in the right or left part of the belly.  You have bloody or tarry looking poop. MAKE SURE YOU:   Understand these instructions.  Will watch your condition.  Will get help right away if you are not doing well or get worse.   This information is not intended to replace advice given to you by your health care provider. Make sure you discuss any questions you have with your health care provider.   Document Released: 11/30/2007 Document Revised: 07/04/2014 Document Reviewed: 02/20/2013 Elsevier Interactive Patient Education [employer].

## 2015-09-24 ENCOUNTER — Encounter (HOSPITAL_COMMUNITY): Payer: Self-pay | Admitting: Emergency Medicine

## 2015-09-24 ENCOUNTER — Emergency Department (HOSPITAL_COMMUNITY)
Admission: EM | Admit: 2015-09-24 | Discharge: 2015-09-25 | Disposition: A | Discharge status: Home / Self Care | Payer: Medicare Other | Attending: Emergency Medicine | Admitting: Emergency Medicine

## 2015-09-24 DIAGNOSIS — S0990XA Unspecified injury of head, initial encounter: Secondary | ICD-10-CM | POA: Insufficient documentation

## 2015-09-24 DIAGNOSIS — Z7982 Long term (current) use of aspirin: Secondary | ICD-10-CM | POA: Insufficient documentation

## 2015-09-24 DIAGNOSIS — Z792 Long term (current) use of antibiotics: Secondary | ICD-10-CM | POA: Insufficient documentation

## 2015-09-24 DIAGNOSIS — R011 Cardiac murmur, unspecified: Secondary | ICD-10-CM | POA: Diagnosis not present

## 2015-09-24 DIAGNOSIS — Y9389 Activity, other specified: Secondary | ICD-10-CM | POA: Insufficient documentation

## 2015-09-24 DIAGNOSIS — W1839XA Other fall on same level, initial encounter: Secondary | ICD-10-CM | POA: Insufficient documentation

## 2015-09-24 DIAGNOSIS — Y998 Other external cause status: Secondary | ICD-10-CM | POA: Diagnosis not present

## 2015-09-24 DIAGNOSIS — Z79899 Other long term (current) drug therapy: Secondary | ICD-10-CM | POA: Insufficient documentation

## 2015-09-24 DIAGNOSIS — I10 Essential (primary) hypertension: Secondary | ICD-10-CM | POA: Diagnosis not present

## 2015-09-24 DIAGNOSIS — Y92009 Unspecified place in unspecified non-institutional (private) residence as the place of occurrence of the external cause: Secondary | ICD-10-CM | POA: Insufficient documentation

## 2015-09-24 DIAGNOSIS — S199XXA Unspecified injury of neck, initial encounter: Secondary | ICD-10-CM | POA: Diagnosis present

## 2015-09-24 DIAGNOSIS — S161XXA Strain of muscle, fascia and tendon at neck level, initial encounter: Secondary | ICD-10-CM | POA: Diagnosis not present

## 2015-09-24 DIAGNOSIS — W19XXXA Unspecified fall, initial encounter: Secondary | ICD-10-CM

## 2015-09-24 LAB — CBC WITH DIFFERENTIAL/PLATELET
BASOS ABS: 0 10*3/uL (ref 0.0–0.1)
Basophils Relative: 0 %
EOS ABS: 0 10*3/uL (ref 0.0–0.7)
Eosinophils Relative: 0 %
HCT: 41 % (ref 36.0–46.0)
Hemoglobin: 13.2 g/dL (ref 12.0–15.0)
Lymphocytes Relative: 7 %
Lymphs Abs: 0.8 10*3/uL (ref 0.7–4.0)
MCH: 30.7 pg (ref 26.0–34.0)
MCHC: 32.2 g/dL (ref 30.0–36.0)
MCV: 95.3 fL (ref 78.0–100.0)
MONO ABS: 0.7 10*3/uL (ref 0.1–1.0)
MONOS PCT: 6 %
Neutro Abs: 9.6 10*3/uL — ABNORMAL HIGH (ref 1.7–7.7)
Neutrophils Relative %: 87 %
Platelets: 163 10*3/uL (ref 150–400)
RBC: 4.3 MIL/uL (ref 3.87–5.11)
RDW: 15 % (ref 11.5–15.5)
WBC: 11.1 10*3/uL — ABNORMAL HIGH (ref 4.0–10.5)

## 2015-09-24 NOTE — ED Notes (Signed)
Pt. lost her balance and fell inside the bathroom this evening at home , pt.'s family assisted her to get up , denies injury or pain , alert and oriented / respirations unlabored .

## 2015-09-25 ENCOUNTER — Emergency Department (HOSPITAL_COMMUNITY): Payer: Medicare Other

## 2015-09-25 LAB — CBG MONITORING, ED: Glucose-Capillary: 156 mg/dL — ABNORMAL HIGH (ref 65–99)

## 2015-09-25 LAB — URINE MICROSCOPIC-ADD ON

## 2015-09-25 LAB — I-STAT CHEM 8, ED
BUN: 22 mg/dL — ABNORMAL HIGH (ref 6–20)
CHLORIDE: 102 mmol/L (ref 101–111)
Calcium, Ion: 1.15 mmol/L (ref 1.13–1.30)
Creatinine, Ser: 1 mg/dL (ref 0.44–1.00)
Glucose, Bld: 114 mg/dL — ABNORMAL HIGH (ref 65–99)
HEMATOCRIT: 37 % (ref 36.0–46.0)
HEMOGLOBIN: 12.6 g/dL (ref 12.0–15.0)
POTASSIUM: 3.2 mmol/L — AB (ref 3.5–5.1)
SODIUM: 141 mmol/L (ref 135–145)
TCO2: 23 mmol/L (ref 0–100)

## 2015-09-25 LAB — URINALYSIS, ROUTINE W REFLEX MICROSCOPIC
Bilirubin Urine: NEGATIVE
Glucose, UA: NEGATIVE mg/dL
Ketones, ur: NEGATIVE mg/dL
Leukocytes, UA: NEGATIVE
Nitrite: NEGATIVE
Protein, ur: 100 mg/dL — AB
Specific Gravity, Urine: 1.024 (ref 1.005–1.030)
pH: 5 (ref 5.0–8.0)

## 2015-09-25 LAB — COMPREHENSIVE METABOLIC PANEL
ALBUMIN: 3.4 g/dL — AB (ref 3.5–5.0)
ALT: 24 U/L (ref 14–54)
ANION GAP: 12 (ref 5–15)
AST: 56 U/L — AB (ref 15–41)
Alkaline Phosphatase: 76 U/L (ref 38–126)
BUN: 22 mg/dL — ABNORMAL HIGH (ref 6–20)
CO2: 21 mmol/L — AB (ref 22–32)
Calcium: 9.2 mg/dL (ref 8.9–10.3)
Chloride: 101 mmol/L (ref 101–111)
Creatinine, Ser: 1.44 mg/dL — ABNORMAL HIGH (ref 0.44–1.00)
GFR calc Af Amer: 39 mL/min — ABNORMAL LOW (ref >60)
GFR calc non Af Amer: 34 mL/min — ABNORMAL LOW (ref >60)
GLUCOSE: 163 mg/dL — AB (ref 65–99)
POTASSIUM: 3.4 mmol/L — AB (ref 3.5–5.1)
Sodium: 134 mmol/L — ABNORMAL LOW (ref 135–145)
Total Bilirubin: 0.7 mg/dL (ref 0.3–1.2)
Total Protein: 8.6 g/dL — ABNORMAL HIGH (ref 6.5–8.1)

## 2015-09-25 LAB — CK
Total CK: 1796 U/L — ABNORMAL HIGH (ref 38–234)
Total CK: 2622 U/L — ABNORMAL HIGH (ref 38–234)

## 2015-09-25 MED ORDER — SODIUM CHLORIDE 0.9 % IV BOLUS (SEPSIS)
1500.0000 mL | Freq: Once | INTRAVENOUS | Status: AC
  Administered 2015-09-25: 1500 mL via INTRAVENOUS

## 2015-09-25 MED ORDER — SODIUM CHLORIDE 0.9 % IV BOLUS (SEPSIS)
500.0000 mL | Freq: Once | INTRAVENOUS | Status: AC
  Administered 2015-09-25: 500 mL via INTRAVENOUS

## 2015-09-25 NOTE — Discharge Instructions (Signed)
Drink plenty of fluids and get plenty of rest.  Return to the ER if symptoms significantly worsen or change.   Cervical Sprain A cervical sprain is an injury in the neck in which the strong, fibrous tissues (ligaments) that connect your neck bones stretch or tear. Cervical sprains can range from mild to severe. Severe cervical sprains can cause the neck vertebrae to be unstable. This can lead to damage of the spinal cord and can result in serious nervous system problems. The amount of time it takes for a cervical sprain to get better depends on the cause and extent of the injury. Most cervical sprains heal in 1 to 3 weeks. CAUSES  Severe cervical sprains may be caused by:   Contact sport injuries (such as from football, rugby, wrestling, hockey, auto racing, gymnastics, diving, martial arts, or boxing).   Motor vehicle collisions.   Whiplash injuries. This is an injury from a sudden forward and backward whipping movement of the head and neck.  Falls.  Mild cervical sprains may be caused by:   Being in an awkward position, such as while cradling a telephone between your ear and shoulder.   Sitting in a chair that does not offer proper support.   Working at a poorly Landscape architect station.   Looking up or down for long periods of time.  SYMPTOMS   Pain, soreness, stiffness, or a burning sensation in the front, back, or sides of the neck. This discomfort may develop immediately after the injury or slowly, 24 hours or more after the injury.   Pain or tenderness directly in the middle of the back of the neck.   Shoulder or upper back pain.   Limited ability to move the neck.   Headache.   Dizziness.   Weakness, numbness, or tingling in the hands or arms.   Muscle spasms.   Difficulty swallowing or chewing.   Tenderness and swelling of the neck.  DIAGNOSIS  Most of the time your health care provider can diagnose a cervical sprain by taking your history  and doing a physical exam. Your health care provider will ask about previous neck injuries and any known neck problems, such as arthritis in the neck. X-rays may be taken to find out if there are any other problems, such as with the bones of the neck. Other tests, such as a CT scan or MRI, may also be needed.  TREATMENT  Treatment depends on the severity of the cervical sprain. Mild sprains can be treated with rest, keeping the neck in place (immobilization), and pain medicines. Severe cervical sprains are immediately immobilized. Further treatment is done to help with pain, muscle spasms, and other symptoms and may include:  Medicines, such as pain relievers, numbing medicines, or muscle relaxants.   Physical therapy. This may involve stretching exercises, strengthening exercises, and posture training. Exercises and improved posture can help stabilize the neck, strengthen muscles, and help stop symptoms from returning.  HOME CARE INSTRUCTIONS   Put ice on the injured area.   Put ice in a plastic bag.   Place a towel between your skin and the bag.   Leave the ice on for 15-20 minutes, 3-4 times a day.   If your injury was severe, you may have been given a cervical collar to wear. A cervical collar is a two-piece collar designed to keep your neck from moving while it heals.  Do not remove the collar unless instructed by your health care provider.  If you have  long hair, keep it outside of the collar.  Ask your health care provider before making any adjustments to your collar. Minor adjustments may be required over time to improve comfort and reduce pressure on your chin or on the back of your head.  Ifyou are allowed to remove the collar for cleaning or bathing, follow your health care provider's instructions on how to do so safely.  Keep your collar clean by wiping it with mild soap and water and drying it completely. If the collar you have been given includes removable pads, remove  them every 1-2 days and hand wash them with soap and water. Allow them to air dry. They should be completely dry before you wear them in the collar.  If you are allowed to remove the collar for cleaning and bathing, wash and dry the skin of your neck. Check your skin for irritation or sores. If you see any, tell your health care provider.  Do not drive while wearing the collar.   Only take over-the-counter or prescription medicines for pain, discomfort, or fever as directed by your health care provider.   Keep all follow-up appointments as directed by your health care provider.   Keep all physical therapy appointments as directed by your health care provider.   Make any needed adjustments to your workstation to promote good posture.   Avoid positions and activities that make your symptoms worse.   Warm up and stretch before being active to help prevent problems.  SEEK MEDICAL CARE IF:   Your pain is not controlled with medicine.   You are unable to decrease your pain medicine over time as planned.   Your activity level is not improving as expected.  SEEK IMMEDIATE MEDICAL CARE IF:   You develop any bleeding.  You develop stomach upset.  You have signs of an allergic reaction to your medicine.   Your symptoms get worse.   You develop new, unexplained symptoms.   You have numbness, tingling, weakness, or paralysis in any part of your body.  MAKE SURE YOU:   Understand these instructions.  Will watch your condition.  Will get help right away if you are not doing well or get worse.   This information is not intended to replace advice given to you by your health care provider. Make sure you discuss any questions you have with your health care provider.   Document Released: 04/10/2007 Document Revised: 06/18/2013 Document Reviewed: 12/19/2012 Elsevier Interactive Patient Education 2016 [place], Adult A concussion, or closed-head injury, is  a brain injury caused by a direct blow to the head or by a quick and sudden movement (jolt) of the head or neck. Concussions are usually not life-threatening. Even so, the effects of a concussion can be serious. If you have had a concussion before, you are more likely to experience concussion-like symptoms after a direct blow to the head.  CAUSES  Direct blow to the head, such as from running into another player during a soccer game, being hit in a fight, or hitting your head on a hard surface.  A jolt of the head or neck that causes the brain to move back and forth inside the skull, such as in a car crash. SIGNS AND SYMPTOMS The signs of a concussion can be hard to notice. Early on, they may be missed by you, family members, and health care providers. You may look fine but act or feel differently. Symptoms are usually temporary, but they may last  for days, weeks, or even longer. Some symptoms may appear right away while others may not show up for hours or days. Every head injury is different. Symptoms include:  Mild to moderate headaches that will not go away.  A feeling of pressure inside your head.  Having more trouble than usual:  Learning or remembering things you have heard.  Answering questions.  Paying attention or concentrating.  Organizing daily tasks.  Making decisions and solving problems.  Slowness in thinking, acting or reacting, speaking, or reading.  Getting lost or being easily confused.  Feeling tired all the time or lacking energy (fatigued).  Feeling drowsy.  Sleep disturbances.  Sleeping more than usual.  Sleeping less than usual.  Trouble falling asleep.  Trouble sleeping (insomnia).  Loss of balance or feeling lightheaded or dizzy.  Nausea or vomiting.  Numbness or tingling.  Increased sensitivity to:  Sounds.  Lights.  Distractions.  Vision problems or eyes that tire easily.  Diminished sense of taste or smell.  Ringing in the  ears.  Mood changes such as feeling sad or anxious.  Becoming easily irritated or angry for little or no reason.  Lack of motivation.  Seeing or hearing things other people do not see or hear (hallucinations). DIAGNOSIS Your health care provider can usually diagnose a concussion based on a description of your injury and symptoms. He or she will ask whether you passed out (lost consciousness) and whether you are having trouble remembering events that happened right before and during your injury. Your evaluation might include:  A brain scan to look for signs of injury to the brain. Even if the test shows no injury, you may still have a concussion.  Blood tests to be sure other problems are not present. TREATMENT  Concussions are usually treated in an emergency department, in urgent care, or at a clinic. You may need to stay in the hospital overnight for further treatment.  Tell your health care provider if you are taking any medicines, including prescription medicines, over-the-counter medicines, and natural remedies. Some medicines, such as blood thinners (anticoagulants) and aspirin, may increase the chance of complications. Also tell your health care provider whether you have had alcohol or are taking illegal drugs. This information may affect treatment.  Your health care provider will send you home with important instructions to follow.  How fast you will recover from a concussion depends on many factors. These factors include how severe your concussion is, what part of your brain was injured, your age, and how healthy you were before the concussion.  Most people with mild injuries recover fully. Recovery can take time. In general, recovery is slower in older persons. Also, persons who have had a concussion in the past or have other medical problems may find that it takes longer to recover from their current injury. HOME CARE INSTRUCTIONS General Instructions  Carefully follow the  directions your health care provider gave you.  Only take over-the-counter or prescription medicines for pain, discomfort, or fever as directed by your health care provider.  Take only those medicines that your health care provider has approved.  Do not drink alcohol until your health care provider says you are well enough to do so. Alcohol and certain other drugs may slow your recovery and can put you at risk of further injury.  If it is harder than usual to remember things, write them down.  If you are easily distracted, try to do one thing at a time. For example, do  not try to watch TV while fixing dinner.  Talk with family members or close friends when making important decisions.  Keep all follow-up appointments. Repeated evaluation of your symptoms is recommended for your recovery.  Watch your symptoms and tell others to do the same. Complications sometimes occur after a concussion. Older adults with a brain injury may have a higher risk of serious complications, such as a blood clot on the brain.  Tell your teachers, school nurse, school counselor, coach, athletic trainer, or work Freight forwarder about your injury, symptoms, and restrictions. Tell them about what you can or cannot do. They should watch for:  Increased problems with attention or concentration.  Increased difficulty remembering or learning new information.  Increased time needed to complete tasks or assignments.  Increased irritability or decreased ability to cope with stress.  Increased symptoms.  Rest. Rest helps the brain to heal. Make sure you:  Get plenty of sleep at night. Avoid staying up late at night.  Keep the same bedtime hours on weekends and weekdays.  Rest during the day. Take daytime naps or rest breaks when you feel tired.  Limit activities that require a lot of thought or concentration. These include:  Doing homework or job-related work.  Watching TV.  Working on the computer.  Avoid any  situation where there is potential for another head injury (football, hockey, soccer, basketball, martial arts, downhill snow sports and horseback riding). Your condition will get worse every time you experience a concussion. You should avoid these activities until you are evaluated by the appropriate follow-up health care providers. Returning To Your Regular Activities You will need to return to your normal activities slowly, not all at once. You must give your body and brain enough time for recovery.  Do not return to sports or other athletic activities until your health care provider tells you it is safe to do so.  Ask your health care provider when you can drive, ride a bicycle, or operate heavy machinery. Your ability to react may be slower after a brain injury. Never do these activities if you are dizzy.  Ask your health care provider about when you can return to work or school. Preventing Another Concussion It is very important to avoid another brain injury, especially before you have recovered. In rare cases, another injury can lead to permanent brain damage, brain swelling, or death. The risk of this is greatest during the first 7-10 days after a head injury. Avoid injuries by:  Wearing a seat belt when riding in a car.  Drinking alcohol only in moderation.  Wearing a helmet when biking, skiing, skateboarding, skating, or doing similar activities.  Avoiding activities that could lead to a second concussion, such as contact or recreational sports, until your health care provider says it is okay.  Taking safety measures in your home.  Remove clutter and tripping hazards from floors and stairways.  Use grab bars in bathrooms and handrails by stairs.  Place non-slip mats on floors and in bathtubs.  Improve lighting in dim areas. SEEK MEDICAL CARE IF:  You have increased problems paying attention or concentrating.  You have increased difficulty remembering or learning new  information.  You need more time to complete tasks or assignments than before.  You have increased irritability or decreased ability to cope with stress.  You have more symptoms than before. Seek medical care if you have any of the following symptoms for more than 2 weeks after your injury:  Lasting (chronic) headaches.  Dizziness or balance problems.  Nausea.  Vision problems.  Increased sensitivity to noise or light.  Depression or mood swings.  Anxiety or irritability.  Memory problems.  Difficulty concentrating or paying attention.  Sleep problems.  Feeling tired all the time. SEEK IMMEDIATE MEDICAL CARE IF:  You have severe or worsening headaches. These may be a sign of a blood clot in the brain.  You have weakness (even if only in one hand, leg, or part of the face).  You have numbness.  You have decreased coordination.  You vomit repeatedly.  You have increased sleepiness.  One pupil is larger than the other.  You have convulsions.  You have slurred speech.  You have increased confusion. This may be a sign of a blood clot in the brain.  You have increased restlessness, agitation, or irritability.  You are unable to recognize people or places.  You have neck pain.  It is difficult to wake you up.  You have unusual behavior changes.  You lose consciousness. MAKE SURE YOU:  Understand these instructions.  Will watch your condition.  Will get help right away if you are not doing well or get worse.   This information is not intended to replace advice given to you by your health care provider. Make sure you discuss any questions you have with your health care provider.   Document Released: 09/03/2003 Document Revised: 07/04/2014 Document Reviewed: 01/03/2013 Elsevier Interactive Patient Education [employer].

## 2015-09-25 NOTE — ED Notes (Signed)
Pt returned from CT °

## 2015-09-25 NOTE — ED Notes (Signed)
Pt out the room to to CT.

## 2015-09-25 NOTE — ED Notes (Signed)
Drinking gingerale at this time.  Family at the bedside.  Encouraged to call for assistance as needed.

## 2015-09-25 NOTE — ED Notes (Signed)
Taken to xray at this time. 

## 2015-09-25 NOTE — ED Provider Notes (Signed)
CSN: [id number]     Arrival date & time 09/24/15  2310 History   First MD Initiated Contact with Patient 09/24/15 2348     Chief Complaint  Patient presents with  . Fall     (Consider location/radiation/quality/duration/timing/severity/associated sxs/prior Treatment) HPI Comments: 80 year old female with past medical history including hypertension who presents for evaluation after a fall. Earlier this evening, the patient states she lost her balance while in the bathroom and fell to the ground. She denies loss of consciousness. She denies head injury. She states she laid there for several hours before family members found her and assisted her to standing. She was able to ambulate with police and EMS. She denies any complaints of pain. Family does note that she has had a cold recently including cough and decreased PO intake. The patient states she had vomiting earlier today. She denies chest pain or difficulty breathing. No abdominal pain or urinary symptoms. No diarrhea.  Patient is a 80 y.o. female presenting with fall. The history is provided by the patient and a relative.  Fall    Past Medical History  Diagnosis Date  . Hypertension    History reviewed. No pertinent past surgical history. No family history on file. Social History  Substance Use Topics  . Smoking status: Never Smoker   . Smokeless tobacco: None  . Alcohol Use: No   OB History    No data available     Review of Systems 10 Systems reviewed and are negative for acute change except as noted in the HPI.    Allergies  Review of patient's allergies indicates no known allergies.  Home Medications   Prior to Admission medications   Medication Sig Start Date End Date Taking? Authorizing Provider  albuterol (PROVENTIL HFA;VENTOLIN HFA) 108 (90 BASE) MCG/ACT inhaler Inhale 1-2 puffs into the lungs every 6 (six) hours as needed for wheezing or shortness of breath. 03/19/13   Carmin Muskrat, MD  aspirin EC 81 MG  tablet Take 81 mg by mouth daily.    Historical Provider, MD  OVER THE COUNTER MEDICATION Take 15 mLs by mouth 4 (four) times daily as needed (cold).    Historical Provider, MD  oxyCODONE-acetaminophen (ROXICET) 5-325 MG tablet Take 1 tablet by mouth every 6 (six) hours as needed for moderate pain or severe pain. 07/11/15   Kinnie Feil, MD  tobramycin (TOBREX) 0.3 % ophthalmic solution Place 1 drop into both eyes every 4 (four) hours.    Historical Provider, MD  triamterene-hydrochlorothiazide (MAXZIDE-25) 37.5-25 MG per tablet Take 0.5 tablets by mouth daily.    Historical Provider, MD   BP 128/66 mmHg  Pulse 93  Temp(Src) 99.2 F (37.3 C) (Oral)  Resp 16  SpO2 93% Physical Exam  Constitutional: She is oriented to person, place, and time. She appears well-developed and well-nourished. No distress.  HENT:  Head: Normocephalic and atraumatic.  Moist mucous membranes  Eyes: Conjunctivae and EOM are normal. Pupils are equal, round, and reactive to light.  Neck: Normal range of motion. Neck supple.  Cardiovascular: Normal rate and regular rhythm.   Murmur heard. 2/6 systolic murmur  Pulmonary/Chest: Effort normal and breath sounds normal. She exhibits no tenderness.  Abdominal: Soft. Bowel sounds are normal. She exhibits no distension. There is no tenderness.  Musculoskeletal: She exhibits no edema or tenderness.  Neurological: She is alert and oriented to person, place, and time.  Fluent speech; 5/5 strength BUE, 4/5 BLE  Skin: Skin is warm and dry.  Psychiatric: She  has a normal mood and affect. Judgment normal.  Nursing note and vitals reviewed.   ED Course  Procedures (including critical care time) Labs Review Labs Reviewed  CBC WITH DIFFERENTIAL/PLATELET - Abnormal; Notable for the following:    WBC 11.1 (*)    Neutro Abs 9.6 (*)    All other components within normal limits  COMPREHENSIVE METABOLIC PANEL - Abnormal; Notable for the following:    Sodium 134 (*)     Potassium 3.4 (*)    CO2 21 (*)    Glucose, Bld 163 (*)    BUN 22 (*)    Creatinine, Ser 1.44 (*)    Total Protein 8.6 (*)    Albumin 3.4 (*)    AST 56 (*)    GFR calc non Af Amer 34 (*)    GFR calc Af Amer 39 (*)    All other components within normal limits  CBG MONITORING, ED - Abnormal; Notable for the following:    Glucose-Capillary 156 (*)    All other components within normal limits  URINALYSIS, ROUTINE W REFLEX MICROSCOPIC (NOT AT [facility])  CK    Imaging Review No results found. I have personally reviewed and evaluated these lab results as part of my medical decision-making.   EKG Interpretation   Date/Time:  Friday September 25 2015 00:37:19 EDT Ventricular Rate:  96 PR Interval:  196 QRS Duration: 79 QT Interval:  375 QTC Calculation: 474 R Axis:   -41 Text Interpretation:  Sinus rhythm Consider left atrial enlargement  Inferior infarct, old Anterior infarct, age indeterminate abnormal R wave  progression Similar to EKG from sept 23 2014 Confirmed by Fedora Knisely MD,  Davena Julian [id number]) on 09/25/2015 12:40:24 AM     Medications  sodium chloride 0.9 % bolus 1,500 mL (1,500 mLs Intravenous New Bag/Given 09/25/15 0048)    MDM   Final diagnoses:  None   PT presents after a fall that occurred this evening. She was on the ground for a few hours before family members assisted her to standing. On exam, she was comfortable with normal vital signs. No complaints of pain and no evidence of trauma. She did have an intermittent cough. No extremity pain. Obtained above lab work which showed creatinine 1.44 which is elevated from patient's baseline. I suspect mild dehydration. Gave the patient an IV fluid bolus.Obtained chest x-ray given cough as well as CT of head and C-spine given an unwitnessed fall.  I have rechecked the end of my shift and and signing out to the oncoming provider. We are awaiting results of UA and CK as well as imaging studies. The patient's disposition will be  determined based on these results.  Sharlett Iles, MD 09/25/15 [phone]

## 2015-09-25 NOTE — ED Notes (Signed)
Repeat CK sent to main lab

## 2015-10-10 ENCOUNTER — Emergency Department (HOSPITAL_COMMUNITY): Payer: Medicare Other

## 2015-10-10 ENCOUNTER — Encounter (HOSPITAL_COMMUNITY): Payer: Self-pay | Admitting: Emergency Medicine

## 2015-10-10 ENCOUNTER — Emergency Department (HOSPITAL_COMMUNITY)
Admission: EM | Admit: 2015-10-10 | Discharge: 2015-10-10 | Disposition: A | Discharge status: Home / Self Care | Payer: Medicare Other | Attending: Emergency Medicine | Admitting: Emergency Medicine

## 2015-10-10 DIAGNOSIS — I10 Essential (primary) hypertension: Secondary | ICD-10-CM | POA: Insufficient documentation

## 2015-10-10 DIAGNOSIS — R079 Chest pain, unspecified: Secondary | ICD-10-CM | POA: Diagnosis not present

## 2015-10-10 DIAGNOSIS — Z7982 Long term (current) use of aspirin: Secondary | ICD-10-CM | POA: Insufficient documentation

## 2015-10-10 DIAGNOSIS — J40 Bronchitis, not specified as acute or chronic: Secondary | ICD-10-CM

## 2015-10-10 DIAGNOSIS — R109 Unspecified abdominal pain: Secondary | ICD-10-CM

## 2015-10-10 DIAGNOSIS — K5900 Constipation, unspecified: Secondary | ICD-10-CM | POA: Diagnosis not present

## 2015-10-10 DIAGNOSIS — Z79899 Other long term (current) drug therapy: Secondary | ICD-10-CM | POA: Diagnosis not present

## 2015-10-10 DIAGNOSIS — J209 Acute bronchitis, unspecified: Secondary | ICD-10-CM | POA: Insufficient documentation

## 2015-10-10 DIAGNOSIS — R05 Cough: Secondary | ICD-10-CM | POA: Diagnosis present

## 2015-10-10 LAB — COMPREHENSIVE METABOLIC PANEL
ALT: 22 U/L (ref 14–54)
AST: 21 U/L (ref 15–41)
Albumin: 2.9 g/dL — ABNORMAL LOW (ref 3.5–5.0)
Alkaline Phosphatase: 68 U/L (ref 38–126)
Anion gap: 9 (ref 5–15)
BILIRUBIN TOTAL: 0.4 mg/dL (ref 0.3–1.2)
BUN: 10 mg/dL (ref 6–20)
CO2: 30 mmol/L (ref 22–32)
CREATININE: 0.73 mg/dL (ref 0.44–1.00)
Calcium: 9.4 mg/dL (ref 8.9–10.3)
Chloride: 106 mmol/L (ref 101–111)
Glucose, Bld: 108 mg/dL — ABNORMAL HIGH (ref 65–99)
Potassium: 3 mmol/L — ABNORMAL LOW (ref 3.5–5.1)
Sodium: 145 mmol/L (ref 135–145)
TOTAL PROTEIN: 8 g/dL (ref 6.5–8.1)

## 2015-10-10 LAB — CBC
HEMATOCRIT: 33.2 % — AB (ref 36.0–46.0)
Hemoglobin: 10.7 g/dL — ABNORMAL LOW (ref 12.0–15.0)
MCH: 29.6 pg (ref 26.0–34.0)
MCHC: 32.2 g/dL (ref 30.0–36.0)
MCV: 92 fL (ref 78.0–100.0)
PLATELETS: 318 10*3/uL (ref 150–400)
RBC: 3.61 MIL/uL — ABNORMAL LOW (ref 3.87–5.11)
RDW: 13.6 % (ref 11.5–15.5)
WBC: 4.7 10*3/uL (ref 4.0–10.5)

## 2015-10-10 LAB — I-STAT CG4 LACTIC ACID, ED: LACTIC ACID, VENOUS: 1.05 mmol/L (ref 0.5–2.0)

## 2015-10-10 MED ORDER — IPRATROPIUM-ALBUTEROL 0.5-2.5 (3) MG/3ML IN SOLN
3.0000 mL | Freq: Once | RESPIRATORY_TRACT | Status: AC
  Administered 2015-10-10: 3 mL via RESPIRATORY_TRACT
  Filled 2015-10-10: qty 3

## 2015-10-10 MED ORDER — ALBUTEROL SULFATE HFA 108 (90 BASE) MCG/ACT IN AERS
2.0000 | INHALATION_SPRAY | Freq: Once | RESPIRATORY_TRACT | Status: AC
  Administered 2015-10-10: 2 via RESPIRATORY_TRACT
  Filled 2015-10-10: qty 6.7

## 2015-10-10 MED ORDER — MILK AND MOLASSES ENEMA
1.0000 | Freq: Once | RECTAL | Status: DC
  Filled 2015-10-10: qty 250

## 2015-10-10 MED ORDER — BENZONATATE 100 MG PO CAPS: 100.0000 mg | ORAL_CAPSULE | Freq: Three times a day (TID) | ORAL | Status: DC

## 2015-10-10 MED ORDER — BENZONATATE 100 MG PO CAPS
100.0000 mg | ORAL_CAPSULE | Freq: Once | ORAL | Status: AC
  Administered 2015-10-10: 100 mg via ORAL
  Filled 2015-10-10: qty 1

## 2015-10-10 MED ORDER — AZITHROMYCIN 250 MG PO TABS: 250.0000 mg | ORAL_TABLET | Freq: Every day | ORAL | Status: DC

## 2015-10-10 NOTE — ED Provider Notes (Signed)
CSN: [id number]     Arrival date & time 10/10/15  1355 History   First MD Initiated Contact with Patient 10/10/15 1718     Chief Complaint  Patient presents with  . Bronchitis  . Constipation     (Consider location/radiation/quality/duration/timing/severity/associated sxs/prior Treatment) HPI Megan Ryan is a 80 y.o. female with history of hypertension and bronchitis, presents to emergency department complaining of cough, shortness of breath, constipation. Patient states that she has had cough and chest congestion for approximately 2 weeks, states is getting worse. She reports wheezing. She reports prior wheezing in the past which required breathing treatment. She does not routinely take any inhalers. She denies any fever or chills. Denies any shortness of breath. Denies any hemoptosis. No tx prior to coming in. Pt also states she has not had a bowel movement in 1 week. She denies any abdominal pain, nausea, vomiting. She states she has hx of hernia repair int he past. States that she has had constipation in the past, was told to take miralax in the past which she has been taking with no relief of her symptoms.   Past Medical History  Diagnosis Date  . Hypertension    History reviewed. No pertinent past surgical history. No family history on file. Social History  Substance Use Topics  . Smoking status: Never Smoker   . Smokeless tobacco: None  . Alcohol Use: No   OB History    No data available     Review of Systems  Constitutional: Negative for fever and chills.  Respiratory: Positive for cough, chest tightness and wheezing. Negative for shortness of breath.   Cardiovascular: Negative for chest pain, palpitations and leg swelling.  Gastrointestinal: Positive for constipation. Negative for nausea, vomiting, abdominal pain, diarrhea and blood in stool.  Genitourinary: Negative for dysuria, flank pain, vaginal bleeding, vaginal discharge, vaginal pain and pelvic pain.   Musculoskeletal: Negative for myalgias, arthralgias, neck pain and neck stiffness.  Skin: Negative for rash.  Neurological: Negative for dizziness, weakness and headaches.  All other systems reviewed and are negative.     Allergies  Review of patient's allergies indicates no known allergies.  Home Medications   Prior to Admission medications   Medication Sig Start Date End Date Taking? Authorizing Provider  albuterol (PROVENTIL HFA;VENTOLIN HFA) 108 (90 BASE) MCG/ACT inhaler Inhale 1-2 puffs into the lungs every 6 (six) hours as needed for wheezing or shortness of breath. 03/19/13   Carmin Muskrat, MD  aspirin EC 81 MG tablet Take 81 mg by mouth daily.    Historical Provider, MD  losartan (COZAAR) 50 MG tablet Take 50 mg by mouth daily.    Historical Provider, MD  oxyCODONE-acetaminophen (ROXICET) 5-325 MG tablet Take 1 tablet by mouth every 6 (six) hours as needed for moderate pain or severe pain. 07/11/15   Kinnie Feil, MD   BP 137/70 mmHg  Pulse 75  Temp(Src) 98.3 F (36.8 C) (Oral)  Resp 18  SpO2 96% Physical Exam  Constitutional: She is oriented to person, place, and time. She appears well-developed and well-nourished. No distress.  HENT:  Head: Normocephalic.  Eyes: Conjunctivae are normal.  Neck: Normal range of motion. Neck supple.  Cardiovascular: Normal rate, regular rhythm and normal heart sounds.   Pulmonary/Chest: Effort normal. No respiratory distress. She has wheezes. She has no rales.  Inspiratory and expiratory wheezes bilaterally  Abdominal: Soft. Bowel sounds are normal. She exhibits no distension. There is no tenderness. There is no rebound.  Musculoskeletal: She  exhibits no edema.  Neurological: She is alert and oriented to person, place, and time.  Skin: Skin is warm and dry.  Psychiatric: She has a normal mood and affect. Her behavior is normal.  Nursing note and vitals reviewed.   ED Course  Procedures (including critical care time) Labs  Review Labs Reviewed  CBC - Abnormal; Notable for the following:    RBC 3.61 (*)    Hemoglobin 10.7 (*)    HCT 33.2 (*)    All other components within normal limits  COMPREHENSIVE METABOLIC PANEL - Abnormal; Notable for the following:    Potassium 3.0 (*)    Glucose, Bld 108 (*)    Albumin 2.9 (*)    All other components within normal limits  I-STAT CG4 LACTIC ACID, ED    Imaging Review Dg Chest 2 View  10/10/2015  CLINICAL DATA:  80 year old female with a 1 week history of productive cough and wheezing EXAM: CHEST  2 VIEW COMPARISON:  Prior chest x-ray 09/25/2015 FINDINGS: Stable cardiomegaly. Atherosclerotic calcifications again noted in the transverse aorta. Mild chronic bronchitic change similar compared to prior. No new focal airspace consolidation, pulmonary edema, pleural effusion or pneumothorax. No acute osseous abnormality. IMPRESSION: Stable cardiomegaly without evidence of acute cardiopulmonary process. Electronically Signed   By: Jacqulynn Cadet M.D.   On: 10/10/2015 15:15   Dg Abd 2 Views  10/10/2015  CLINICAL DATA:  Abdominal pain and constipation for 3 days. EXAM: ABDOMEN - 2 VIEW COMPARISON:  08/20/2006 CT abdomen/pelvis and abdominal radiograph. FINDINGS: Hernia repair mesh overlies the lower abdomen. No dilated small bowel loops or air-fluid levels. Moderate stool throughout the colon. No evidence of pneumatosis or pneumoperitoneum. Mild bibasilar scarring versus atelectasis. Mild cardiomegaly. No pathologic soft tissue calcifications. Moderate degenerative changes in the visualized thoracolumbar spine. IMPRESSION: 1. Nonobstructive bowel gas pattern. 2. Moderate colonic stool volume, suggesting constipation. 3. Mild cardiomegaly. 4. Mild scarring versus atelectasis at both lung bases. Electronically Signed   By: Ilona Sorrel M.D.   On: 10/10/2015 18:19   I have personally reviewed and evaluated these images and lab results as part of my medical decision-making.   EKG  Interpretation None      MDM   Final diagnoses:  Abdominal pain  Bronchitis  Constipation, unspecified constipation type   Pt in emergency department with cough, chest congestion, wheezing. Will start breathing treatments. Patient is also complaining of constipation. She does have history of abdominal surgery with hernia repair, although I considered small bowel projection, patient denies abdominal pain, no nausea or vomiting. Bowel sounds are present on exam. Will try an enema. Plain x-ray showing constipation.  Patient feels much better after 2 breathing treatments. She is requesting to go home. She refused enema stating that she is hungry and ready to go eat. She states that she will do her own enema at home. Denies abdominal pain, nausea or vomiting, expresses how hungry she is. I will treat patient's bronchitis with albuterol inhaler, Tessalon Perles, and will place her on Z-Pak given she has had this cough for 2 weeks. She'll have also advised her to start taking allergy medications given high levels of pollen at this time. As far as her constipation, have advised her to double her Mira lax does and to try Fleet enema at home. If her pain is worsening or she develops nausea vomiting, fever she is to return  to emergency department. Patient agrees with the plan and voices understanding Filed Vitals:   10/10/15 1424 10/10/15  1807 10/10/15 2018  BP: 159/116 137/70 160/80  Pulse: 102 75 89  Temp: 98.3 F (36.8 C) 98.3 F (36.8 C)   TempSrc: Oral Oral   Resp: 18 18 14   SpO2: 95% 96% 95%     Jeannett Senior, PA-C 10/11/15 [place], MD 10/11/15 2306

## 2015-10-10 NOTE — ED Notes (Signed)
Per pt, states constipation-no BM since last Sunday-states she also fell

## 2015-10-10 NOTE — Discharge Instructions (Signed)
Take fleet enema when you get home. You can try again the day after. Take miralax double or triple dose. Take albuterol inhaler 2 puffs every 4hrs for wheezing and cough. Take zithromax as prescribed until all gone. Take tessalon to help with coughing. Follow up with your doctor closely. Return if worsening.   Acute Bronchitis Bronchitis is inflammation of the airways that extend from the windpipe into the lungs (bronchi). The inflammation often causes mucus to develop. This leads to a cough, which is the most common symptom of bronchitis.  In acute bronchitis, the condition usually develops suddenly and goes away over time, usually in a couple weeks. Smoking, allergies, and asthma can make bronchitis worse. Repeated episodes of bronchitis may cause further lung problems.  CAUSES Acute bronchitis is most often caused by the same virus that causes a cold. The virus can spread from person to person (contagious) through coughing, sneezing, and touching contaminated objects. SIGNS AND SYMPTOMS   Cough.   Fever.   Coughing up mucus.   Body aches.   Chest congestion.   Chills.   Shortness of breath.   Sore throat.  DIAGNOSIS  Acute bronchitis is usually diagnosed through a physical exam. Your health care provider will also ask you questions about your medical history. Tests, such as chest X-rays, are sometimes done to rule out other conditions.  TREATMENT  Acute bronchitis usually goes away in a couple weeks. Oftentimes, no medical treatment is necessary. Medicines are sometimes given for relief of fever or cough. Antibiotic medicines are usually not needed but may be prescribed in certain situations. In some cases, an inhaler may be recommended to help reduce shortness of breath and control the cough. A cool mist vaporizer may also be used to help thin bronchial secretions and make it easier to clear the chest.  HOME CARE INSTRUCTIONS  Get plenty of rest.   Drink enough fluids to  keep your urine clear or pale yellow (unless you have a medical condition that requires fluid restriction). Increasing fluids may help thin your respiratory secretions (sputum) and reduce chest congestion, and it will prevent dehydration.   Take medicines only as directed by your health care provider.  If you were prescribed an antibiotic medicine, finish it all even if you start to feel better.  Avoid smoking and secondhand smoke. Exposure to cigarette smoke or irritating chemicals will make bronchitis worse. If you are a smoker, consider using nicotine gum or skin patches to help control withdrawal symptoms. Quitting smoking will help your lungs heal faster.   Reduce the chances of another bout of acute bronchitis by washing your hands frequently, avoiding people with cold symptoms, and trying not to touch your hands to your mouth, nose, or eyes.   Keep all follow-up visits as directed by your health care provider.  SEEK MEDICAL CARE IF: Your symptoms do not improve after 1 week of treatment.  SEEK IMMEDIATE MEDICAL CARE IF:  You develop an increased fever or chills.   You have chest pain.   You have severe shortness of breath.  You have bloody sputum.   You develop dehydration.  You faint or repeatedly feel like you are going to pass out.  You develop repeated vomiting.  You develop a severe headache. MAKE SURE YOU:   Understand these instructions.  Will watch your condition.  Will get help right away if you are not doing well or get worse.   This information is not intended to replace advice given to you  by your health care provider. Make sure you discuss any questions you have with your health care provider.   Document Released: 07/21/2004 Document Revised: 07/04/2014 Document Reviewed: 12/04/2012 Elsevier Interactive Patient Education 2016 [employer]. Constipation, Adult Constipation is when a person has fewer than three bowel movements a week, has  difficulty having a bowel movement, or has stools that are dry, hard, or larger than normal. As people grow older, constipation is more common. A low-fiber diet, not taking in enough fluids, and taking certain medicines may make constipation worse.  CAUSES   Certain medicines, such as antidepressants, pain medicine, iron supplements, antacids, and water pills.   Certain diseases, such as diabetes, irritable bowel syndrome (IBS), thyroid disease, or depression.   Not drinking enough water.   Not eating enough fiber-rich foods.   Stress or travel.   Lack of physical activity or exercise.   Ignoring the urge to have a bowel movement.   Using laxatives too much.  SIGNS AND SYMPTOMS   Having fewer than three bowel movements a week.   Straining to have a bowel movement.   Having stools that are hard, dry, or larger than normal.   Feeling full or bloated.   Pain in the lower abdomen.   Not feeling relief after having a bowel movement.  DIAGNOSIS  Your health care provider will take a medical history and perform a physical exam. Further testing may be done for severe constipation. Some tests may include:  A barium enema X-ray to examine your rectum, colon, and, sometimes, your small intestine.   A sigmoidoscopy to examine your lower colon.   A colonoscopy to examine your entire colon. TREATMENT  Treatment will depend on the severity of your constipation and what is causing it. Some dietary treatments include drinking more fluids and eating more fiber-rich foods. Lifestyle treatments may include regular exercise. If these diet and lifestyle recommendations do not help, your health care provider may recommend taking over-the-counter laxative medicines to help you have bowel movements. Prescription medicines may be prescribed if over-the-counter medicines do not work.  HOME CARE INSTRUCTIONS   Eat foods that have a lot of fiber, such as fruits, vegetables, whole  grains, and beans.  Limit foods high in fat and processed sugars, such as french fries, hamburgers, cookies, candies, and soda.   A fiber supplement may be added to your diet if you cannot get enough fiber from foods.   Drink enough fluids to keep your urine clear or pale yellow.   Exercise regularly or as directed by your health care provider.   Go to the restroom when you have the urge to go. Do not hold it.   Only take over-the-counter or prescription medicines as directed by your health care provider. Do not take other medicines for constipation without talking to your health care provider first.  [place] IF:   You have bright red blood in your stool.   Your constipation lasts for more than 4 days or gets worse.   You have abdominal or rectal pain.   You have thin, pencil-like stools.   You have unexplained weight loss. MAKE SURE YOU:   Understand these instructions.  Will watch your condition.  Will get help right away if you are not doing well or get worse.   This information is not intended to replace advice given to you by your health care provider. Make sure you discuss any questions you have with your health care provider.  Document Released: 03/11/2004 Document Revised: 07/04/2014 Document Reviewed: 03/25/2013 Elsevier Interactive Patient Education [employer].

## 2016-04-15 ENCOUNTER — Ambulatory Visit: Payer: Self-pay | Admitting: Obstetrics

## 2016-05-17 ENCOUNTER — Emergency Department (HOSPITAL_COMMUNITY)
Admission: EM | Admit: 2016-05-17 | Discharge: 2016-05-17 | Disposition: A | Discharge status: Home / Self Care | Payer: Medicare Other | Attending: Emergency Medicine | Admitting: Emergency Medicine

## 2016-05-17 ENCOUNTER — Encounter (HOSPITAL_COMMUNITY): Payer: Self-pay | Admitting: Emergency Medicine

## 2016-05-17 ENCOUNTER — Emergency Department (HOSPITAL_COMMUNITY): Payer: Medicare Other

## 2016-05-17 DIAGNOSIS — R1013 Epigastric pain: Secondary | ICD-10-CM | POA: Insufficient documentation

## 2016-05-17 DIAGNOSIS — Z7982 Long term (current) use of aspirin: Secondary | ICD-10-CM | POA: Diagnosis not present

## 2016-05-17 DIAGNOSIS — I10 Essential (primary) hypertension: Secondary | ICD-10-CM | POA: Diagnosis not present

## 2016-05-17 DIAGNOSIS — R112 Nausea with vomiting, unspecified: Secondary | ICD-10-CM | POA: Diagnosis not present

## 2016-05-17 LAB — I-STAT TROPONIN, ED: TROPONIN I, POC: 0 ng/mL (ref 0.00–0.08)

## 2016-05-17 LAB — COMPREHENSIVE METABOLIC PANEL
ALBUMIN: 3.5 g/dL (ref 3.5–5.0)
ALK PHOS: 82 U/L (ref 38–126)
ALT: 18 U/L (ref 14–54)
AST: 21 U/L (ref 15–41)
Anion gap: 6 (ref 5–15)
BUN: 9 mg/dL (ref 6–20)
CALCIUM: 9.6 mg/dL (ref 8.9–10.3)
CHLORIDE: 103 mmol/L (ref 101–111)
CO2: 25 mmol/L (ref 22–32)
CREATININE: 0.63 mg/dL (ref 0.44–1.00)
GFR calc Af Amer: >60 mL/min (ref >60)
GFR calc non Af Amer: >60 mL/min (ref >60)
GLUCOSE: 127 mg/dL — AB (ref 65–99)
Potassium: 4 mmol/L (ref 3.5–5.1)
SODIUM: 134 mmol/L — AB (ref 135–145)
Total Bilirubin: 0.3 mg/dL (ref 0.3–1.2)
Total Protein: 8.7 g/dL — ABNORMAL HIGH (ref 6.5–8.1)

## 2016-05-17 LAB — URINALYSIS, ROUTINE W REFLEX MICROSCOPIC
BILIRUBIN URINE: NEGATIVE
GLUCOSE, UA: NEGATIVE mg/dL
HGB URINE DIPSTICK: NEGATIVE
Ketones, ur: NEGATIVE mg/dL
Leukocytes, UA: NEGATIVE
Nitrite: NEGATIVE
Protein, ur: NEGATIVE mg/dL
SPECIFIC GRAVITY, URINE: 1.014 (ref 1.005–1.030)
pH: 7.5 (ref 5.0–8.0)

## 2016-05-17 LAB — CBC
HCT: 36.9 % (ref 36.0–46.0)
Hemoglobin: 12.1 g/dL (ref 12.0–15.0)
MCH: 30.8 pg (ref 26.0–34.0)
MCHC: 32.8 g/dL (ref 30.0–36.0)
MCV: 93.9 fL (ref 78.0–100.0)
PLATELETS: 211 10*3/uL (ref 150–400)
RBC: 3.93 MIL/uL (ref 3.87–5.11)
RDW: 14.7 % (ref 11.5–15.5)
WBC: 5.7 10*3/uL (ref 4.0–10.5)

## 2016-05-17 LAB — LIPASE, BLOOD: LIPASE: 23 U/L (ref 11–51)

## 2016-05-17 MED ORDER — ONDANSETRON 4 MG PO TBDP: 4.0000 mg | ORAL_TABLET | ORAL | 0 refills | Status: DC | PRN

## 2016-05-17 MED ORDER — IOPAMIDOL (ISOVUE-300) INJECTION 61%
INTRAVENOUS | Status: AC
  Administered 2016-05-17: 100 mL
  Filled 2016-05-17: qty 100

## 2016-05-17 MED ORDER — MORPHINE SULFATE (PF) 4 MG/ML IV SOLN
2.0000 mg | Freq: Once | INTRAVENOUS | Status: AC
  Administered 2016-05-17: 2 mg via INTRAVENOUS
  Filled 2016-05-17: qty 1

## 2016-05-17 MED ORDER — FAMOTIDINE IN NACL 20-0.9 MG/50ML-% IV SOLN
20.0000 mg | Freq: Once | INTRAVENOUS | Status: AC
Start: 2016-05-17 — End: 2016-05-17
  Administered 2016-05-17: 20 mg via INTRAVENOUS
  Filled 2016-05-17: qty 50

## 2016-05-17 MED ORDER — ONDANSETRON HCL 4 MG/2ML IJ SOLN
4.0000 mg | Freq: Once | INTRAMUSCULAR | Status: AC
  Administered 2016-05-17: 4 mg via INTRAVENOUS
  Filled 2016-05-17: qty 2

## 2016-05-17 MED ORDER — ONDANSETRON 4 MG PO TBDP
ORAL_TABLET | ORAL | Status: AC
  Filled 2016-05-17: qty 1

## 2016-05-17 MED ORDER — FAMOTIDINE 20 MG PO TABS: 20.0000 mg | ORAL_TABLET | Freq: Two times a day (BID) | ORAL | 0 refills | Status: DC

## 2016-05-17 MED ORDER — ONDANSETRON 4 MG PO TBDP
4.0000 mg | ORAL_TABLET | Freq: Once | ORAL | Status: AC | PRN
  Administered 2016-05-17: 4 mg via ORAL

## 2016-05-17 NOTE — ED Notes (Signed)
Patient transported to CT 

## 2016-05-17 NOTE — ED Triage Notes (Signed)
Pt from home with c/o sudden onset sharp epigastric pain with emesis x 1 starting this morning.  Pt reports dry heaving but no additional emesis.  No on in the household is sick with similar symptoms.  Denies diarrhea, SOB, CP, other other complaints.  NAD, A&O.

## 2016-05-17 NOTE — ED Provider Notes (Signed)
[facility] DEPT Provider Note   CSN: [id number] Arrival date & time: 05/17/16  1353     History   Chief Complaint Chief Complaint  Patient presents with  . Abdominal Pain  . Emesis    HPI Megan Ryan is a 80 y.o. female.  HPI Patient developed sudden onset of severe epigastric pain at about 7 this morning. She reports that it's been both sharp and intensely cramping. It radiates through to her back. Patient has had significant nausea and some dry heaves in association with this. No recent fevers or chills. No diarrhea. No shortness of breath. Patient denies any history of similar problems. No history of coronary artery disease or gallbladder disease. Pain is somewhat improved by sitting upright. Past Medical History:  Diagnosis Date  . Hypertension     There are no active problems to display for this patient.   History reviewed. No pertinent surgical history.  OB History    No data available       Home Medications    Prior to Admission medications   Medication Sig Start Date End Date Taking? Authorizing Provider  albuterol (PROVENTIL HFA;VENTOLIN HFA) 108 (90 BASE) MCG/ACT inhaler Inhale 1-2 puffs into the lungs every 6 (six) hours as needed for wheezing or shortness of breath. 03/19/13  Yes Carmin Muskrat, MD  amLODipine (NORVASC) 5 MG tablet Take 5 mg by mouth daily.   Yes Historical Provider, MD  aspirin EC 81 MG tablet Take 81 mg by mouth daily.   Yes Historical Provider, MD  linaclotide (LINZESS) 72 MCG capsule Take 72 mcg by mouth daily before breakfast.   Yes Historical Provider, MD  azithromycin (ZITHROMAX) 250 MG tablet Take 1 tablet (250 mg total) by mouth daily. Take first 2 tablets together, then 1 every day until finished. Patient not taking: Reported on 05/17/2016 10/10/15   Tatyana Kirichenko, PA-C  benzonatate (TESSALON) 100 MG capsule Take 1 capsule (100 mg total) by mouth every 8 (eight) hours. Patient not taking: Reported on 05/17/2016  10/10/15   Tatyana Kirichenko, PA-C  famotidine (PEPCID) 20 MG tablet Take 1 tablet (20 mg total) by mouth 2 (two) times daily. 05/17/16   Charlesetta Shanks, MD  ondansetron (ZOFRAN ODT) 4 MG disintegrating tablet Take 1 tablet (4 mg total) by mouth every 4 (four) hours as needed for nausea or vomiting. 05/17/16   Charlesetta Shanks, MD  oxyCODONE-acetaminophen (ROXICET) 5-325 MG tablet Take 1 tablet by mouth every 6 (six) hours as needed for moderate pain or severe pain. Patient not taking: Reported on 05/17/2016 07/11/15   Kinnie Feil, MD    Family History History reviewed. No pertinent family history.  Social History Social History  Substance Use Topics  . Smoking status: Never Smoker  . Smokeless tobacco: Never Used  . Alcohol use No     Allergies   Patient has no known allergies.   Review of Systems Review of Systems  10 Systems reviewed and are negative for acute change except as noted in the HPI.  Physical Exam Updated Vital Signs BP 167/79   Pulse 65   Temp 98 F (36.7 C) (Oral)   Resp 17   Ht 5\' 7"  (1.702 m)   Wt 233 lb (105.7 kg)   SpO2 96%   BMI 36.49 kg/m   Physical Exam  Constitutional: She is oriented to person, place, and time.  Patient is alert and nontoxic. She does have significant central obesity. No respiratory distress.  HENT:  Head: Normocephalic and atraumatic.  Mouth/Throat: Oropharynx is clear and moist.  Eyes: EOM are normal.  Neck: Neck supple.  Cardiovascular: Normal rate, regular rhythm, normal heart sounds and intact distal pulses.   Pulmonary/Chest: Effort normal and breath sounds normal.  Abdominal: Soft. She exhibits distension.  Patient endorses severe tenderness to palpation the epigastrium. Also tender in central abdomen. Voluntary guarding. Lower abdomen nontender.  Musculoskeletal: Normal range of motion. She exhibits no edema, tenderness or deformity.  Neurological: She is alert and oriented to person, place, and time. She  exhibits normal muscle tone. Coordination normal.  Skin: Skin is warm and dry.  Psychiatric: She has a normal mood and affect.     ED Treatments / Results  Labs (all labs ordered are listed, but only abnormal results are displayed) Labs Reviewed  COMPREHENSIVE METABOLIC PANEL - Abnormal; Notable for the following:       Result Value   Sodium 134 (*)    Glucose, Bld 127 (*)    Total Protein 8.7 (*)    All other components within normal limits  URINALYSIS, ROUTINE W REFLEX MICROSCOPIC (NOT AT [facility]) - Abnormal; Notable for the following:    APPearance HAZY (*)    All other components within normal limits  LIPASE, BLOOD  CBC  I-STAT TROPOININ, ED    EKG  EKG Interpretation  Date/Time:  Tuesday May 17 2016 14:19:56 EST Ventricular Rate:  79 PR Interval:  198 QRS Duration: 82 QT Interval:  404 QTC Calculation: 463 R Axis:   39 Text Interpretation:  Sinus rhythm with Fusion complexes Low voltage QRS Inferior infarct , age undetermined Cannot rule out Anterior infarct , age undetermined Abnormal ECG no significant change compared to old. Confirmed by Johnney Killian, MD, Jeannie Done [phone]) on 05/17/2016 4:30:34 PM       Radiology Ct Abdomen Pelvis W Contrast  Result Date: 05/17/2016 CLINICAL DATA:  Abdominal pain, nausea and vomiting today. EXAM: CT ABDOMEN AND PELVIS WITH CONTRAST TECHNIQUE: Multidetector CT imaging of the abdomen and pelvis was performed using the standard protocol following bolus administration of intravenous contrast. CONTRAST:  100 ml ISOVUE-300 IOPAMIDOL (ISOVUE-300) INJECTION 61% COMPARISON:  CT abdomen and pelvis 08/20/2006 FINDINGS: Lower chest: There is cardiomegaly. Small pericardial effusion is identified. Calcific aortic and coronary atherosclerosis is noted. No pleural effusion. Minimal dependent atelectasis is seen. Hepatobiliary: No focal liver abnormality is seen. No gallstones, gallbladder wall thickening, or biliary dilatation. Pancreas: Unremarkable.  No pancreatic ductal dilatation or surrounding inflammatory changes. Spleen: Normal in size without focal abnormality. Adrenals/Urinary Tract: Right renal cyst measuring 4.8 cm is noted. Small left adrenal nodule is unchanged and likely a benign adenoma. Otherwise negative. Stomach/Bowel: Stomach is within normal limits. Appendix appears normal. No evidence of bowel wall thickening, distention, or inflammatory changes. Vascular/Lymphatic: Aortoiliac atherosclerosis without aneurysm is identified. Reproductive: Small calcified fibroids noted.  Otherwise negative. Other: The patient is status post ventral hernia repair. There is mild laxity of the anterior abdominal wall but no recurrent hernia. Musculoskeletal: Lower lumbar spondylosis is noted. Otherwise negative. IMPRESSION: No acute abnormality or finding to explain the patient's symptoms. Aortoiliac atherosclerosis. Cardiomegaly and small pericardial effusion. Electronically Signed   By: Inge Rise M.D.   On: 05/17/2016 18:55    Procedures Procedures (including critical care time)  Medications Ordered in ED Medications  ondansetron (ZOFRAN-ODT) 4 MG disintegrating tablet (not administered)  ondansetron (ZOFRAN-ODT) disintegrating tablet 4 mg (4 mg Oral Given 05/17/16 1422)  famotidine (PEPCID) IVPB 20 mg premix (0 mg Intravenous Stopped 05/17/16 1916)  morphine 4  MG/ML injection 2 mg (2 mg Intravenous Given 05/17/16 1710)  iopamidol (ISOVUE-300) 61 % injection (100 mLs  Contrast Given 05/17/16 1824)  ondansetron (ZOFRAN) injection 4 mg (4 mg Intravenous Given 05/17/16 2030)     Initial Impression / Assessment and Plan / ED Course  I have reviewed the triage vital signs and the nursing notes.  Pertinent labs & imaging results that were available during my care of the patient were reviewed by me and considered in my medical decision making (see chart for details).  Clinical Course    Recheck: (19:36) patient still feels very nauseated.  She reports nausea is worse since point a CT scan. She will be provided with Zofran. Recheck (21:05) patient feels improved after Zofran. She is alert and in no distress. Vital signs stable. Final Clinical Impressions(s) / ED Diagnoses   Final diagnoses:  Epigastric pain  Non-intractable vomiting with nausea, unspecified vomiting type   At this time, there is no definitive etiology for patient's onset of spasmodic epigastric pain and periodic vomiting. Symptoms were very suggestive of biliary colic however no evidence of LFT elevation or gallbladder thickening. Lipase is normal. AAA ruled out. At this time, I have low suspicion for cardiac etiology. Patient pain was very much epigastric and reproducible with palpation and colicky in nature. Patient did not have associated ischemic symptoms of dyspnea or diaphoresis. At this time feel she is stable for discharge. Patient will be given Zofran Pepcid to take as needed. Consideration is for early gastroenteritis symptoms. Patient is counseled on signs and symptoms worsen return. New Prescriptions New Prescriptions   FAMOTIDINE (PEPCID) 20 MG TABLET    Take 1 tablet (20 mg total) by mouth 2 (two) times daily.   ONDANSETRON (ZOFRAN ODT) 4 MG DISINTEGRATING TABLET    Take 1 tablet (4 mg total) by mouth every 4 (four) hours as needed for nausea or vomiting.     Charlesetta Shanks, MD 05/17/16 2114

## 2016-06-27 ENCOUNTER — Emergency Department (HOSPITAL_COMMUNITY): Payer: Medicare Other

## 2016-06-27 ENCOUNTER — Encounter (HOSPITAL_COMMUNITY): Payer: Self-pay | Admitting: Emergency Medicine

## 2016-06-27 ENCOUNTER — Inpatient Hospital Stay (HOSPITAL_COMMUNITY)
Admission: EM | Admit: 2016-06-27 | Discharge: 2016-07-02 | DRG: 871 | Disposition: A | Discharge status: Home / Self Care | Payer: Medicare Other | Attending: Internal Medicine | Admitting: Internal Medicine

## 2016-06-27 DIAGNOSIS — Z833 Family history of diabetes mellitus: Secondary | ICD-10-CM

## 2016-06-27 DIAGNOSIS — Z66 Do not resuscitate: Secondary | ICD-10-CM | POA: Diagnosis present

## 2016-06-27 DIAGNOSIS — M170 Bilateral primary osteoarthritis of knee: Secondary | ICD-10-CM | POA: Diagnosis present

## 2016-06-27 DIAGNOSIS — R059 Cough, unspecified: Secondary | ICD-10-CM

## 2016-06-27 DIAGNOSIS — J181 Lobar pneumonia, unspecified organism: Secondary | ICD-10-CM | POA: Diagnosis present

## 2016-06-27 DIAGNOSIS — Z7982 Long term (current) use of aspirin: Secondary | ICD-10-CM

## 2016-06-27 DIAGNOSIS — G9340 Encephalopathy, unspecified: Secondary | ICD-10-CM | POA: Diagnosis present

## 2016-06-27 DIAGNOSIS — K5909 Other constipation: Secondary | ICD-10-CM | POA: Diagnosis present

## 2016-06-27 DIAGNOSIS — G252 Other specified forms of tremor: Secondary | ICD-10-CM | POA: Diagnosis present

## 2016-06-27 DIAGNOSIS — E119 Type 2 diabetes mellitus without complications: Secondary | ICD-10-CM

## 2016-06-27 DIAGNOSIS — N390 Urinary tract infection, site not specified: Secondary | ICD-10-CM | POA: Diagnosis present

## 2016-06-27 DIAGNOSIS — E669 Obesity, unspecified: Secondary | ICD-10-CM | POA: Diagnosis present

## 2016-06-27 DIAGNOSIS — R609 Edema, unspecified: Secondary | ICD-10-CM | POA: Diagnosis not present

## 2016-06-27 DIAGNOSIS — I5032 Chronic diastolic (congestive) heart failure: Secondary | ICD-10-CM | POA: Diagnosis present

## 2016-06-27 DIAGNOSIS — E876 Hypokalemia: Secondary | ICD-10-CM | POA: Diagnosis present

## 2016-06-27 DIAGNOSIS — R05 Cough: Secondary | ICD-10-CM

## 2016-06-27 DIAGNOSIS — A419 Sepsis, unspecified organism: Secondary | ICD-10-CM | POA: Diagnosis present

## 2016-06-27 DIAGNOSIS — J9601 Acute respiratory failure with hypoxia: Secondary | ICD-10-CM | POA: Diagnosis present

## 2016-06-27 DIAGNOSIS — J189 Pneumonia, unspecified organism: Secondary | ICD-10-CM

## 2016-06-27 DIAGNOSIS — I110 Hypertensive heart disease with heart failure: Secondary | ICD-10-CM | POA: Diagnosis present

## 2016-06-27 DIAGNOSIS — E860 Dehydration: Secondary | ICD-10-CM | POA: Diagnosis present

## 2016-06-27 DIAGNOSIS — I10 Essential (primary) hypertension: Secondary | ICD-10-CM

## 2016-06-27 DIAGNOSIS — I517 Cardiomegaly: Secondary | ICD-10-CM | POA: Diagnosis not present

## 2016-06-27 DIAGNOSIS — N179 Acute kidney failure, unspecified: Secondary | ICD-10-CM | POA: Diagnosis present

## 2016-06-27 DIAGNOSIS — R000 Tachycardia, unspecified: Secondary | ICD-10-CM | POA: Diagnosis present

## 2016-06-27 DIAGNOSIS — Z6834 Body mass index (BMI) 34.0-34.9, adult: Secondary | ICD-10-CM

## 2016-06-27 DIAGNOSIS — Z789 Other specified health status: Secondary | ICD-10-CM | POA: Diagnosis present

## 2016-06-27 DIAGNOSIS — Z803 Family history of malignant neoplasm of breast: Secondary | ICD-10-CM

## 2016-06-27 DIAGNOSIS — R531 Weakness: Secondary | ICD-10-CM | POA: Diagnosis not present

## 2016-06-27 HISTORY — DX: Type 2 diabetes mellitus without complications: E11.9

## 2016-06-27 HISTORY — DX: Unspecified diastolic (congestive) heart failure: I50.30

## 2016-06-27 LAB — COMPREHENSIVE METABOLIC PANEL
ALK PHOS: 71 U/L (ref 38–126)
ALT: 28 U/L (ref 14–54)
ANION GAP: 11 (ref 5–15)
AST: 30 U/L (ref 15–41)
Albumin: 3.2 g/dL — ABNORMAL LOW (ref 3.5–5.0)
BILIRUBIN TOTAL: 1.1 mg/dL (ref 0.3–1.2)
BUN: 23 mg/dL — ABNORMAL HIGH (ref 6–20)
CALCIUM: 8.7 mg/dL — AB (ref 8.9–10.3)
CO2: 25 mmol/L (ref 22–32)
Chloride: 99 mmol/L — ABNORMAL LOW (ref 101–111)
Creatinine, Ser: 1.16 mg/dL — ABNORMAL HIGH (ref 0.44–1.00)
GFR, EST AFRICAN AMERICAN: 51 mL/min — AB (ref >60)
GFR, EST NON AFRICAN AMERICAN: 44 mL/min — AB (ref >60)
Glucose, Bld: 172 mg/dL — ABNORMAL HIGH (ref 65–99)
Potassium: 3.1 mmol/L — ABNORMAL LOW (ref 3.5–5.1)
SODIUM: 135 mmol/L (ref 135–145)
TOTAL PROTEIN: 8.4 g/dL — AB (ref 6.5–8.1)

## 2016-06-27 LAB — CBC WITH DIFFERENTIAL/PLATELET
BASOS ABS: 0 10*3/uL (ref 0.0–0.1)
BASOS PCT: 0 %
EOS ABS: 0 10*3/uL (ref 0.0–0.7)
Eosinophils Relative: 0 %
HCT: 34.1 % — ABNORMAL LOW (ref 36.0–46.0)
HEMOGLOBIN: 11.1 g/dL — AB (ref 12.0–15.0)
Lymphocytes Relative: 6 %
Lymphs Abs: 1 10*3/uL (ref 0.7–4.0)
MCH: 30.4 pg (ref 26.0–34.0)
MCHC: 32.6 g/dL (ref 30.0–36.0)
MCV: 93.4 fL (ref 78.0–100.0)
Monocytes Absolute: 0.9 10*3/uL (ref 0.1–1.0)
Monocytes Relative: 6 %
NEUTROS PCT: 88 %
Neutro Abs: 14.3 10*3/uL — ABNORMAL HIGH (ref 1.7–7.7)
Platelets: 218 10*3/uL (ref 150–400)
RBC: 3.65 MIL/uL — AB (ref 3.87–5.11)
RDW: 14.6 % (ref 11.5–15.5)
WBC: 16.2 10*3/uL — AB (ref 4.0–10.5)

## 2016-06-27 LAB — I-STAT CG4 LACTIC ACID, ED: LACTIC ACID, VENOUS: 1.69 mmol/L (ref 0.5–1.9)

## 2016-06-27 LAB — CG4 I-STAT (LACTIC ACID): Lactic Acid, Venous: 1.36 mmol/L (ref 0.5–1.9)

## 2016-06-27 LAB — PROTIME-INR
INR: 1.39
PROTHROMBIN TIME: 17.2 s — AB (ref 11.4–15.2)

## 2016-06-27 LAB — GLUCOSE, CAPILLARY: Glucose-Capillary: 139 mg/dL — ABNORMAL HIGH (ref 65–99)

## 2016-06-27 LAB — MAGNESIUM: Magnesium: 1.9 mg/dL (ref 1.7–2.4)

## 2016-06-27 MED ORDER — POTASSIUM CHLORIDE 10 MEQ/100ML IV SOLN
10.0000 meq | INTRAVENOUS | Status: AC
  Administered 2016-06-27 – 2016-06-28 (×3): 10 meq via INTRAVENOUS
  Filled 2016-06-27 (×3): qty 100

## 2016-06-27 MED ORDER — ACETAMINOPHEN 325 MG PO TABS
650.0000 mg | ORAL_TABLET | Freq: Four times a day (QID) | ORAL | Status: DC | PRN
  Administered 2016-06-28 – 2016-06-29 (×2): 650 mg via ORAL
  Filled 2016-06-27 (×2): qty 2

## 2016-06-27 MED ORDER — INSULIN ASPART 100 UNIT/ML ~~LOC~~ SOLN
0.0000 [IU] | Freq: Every day | SUBCUTANEOUS | Status: DC
Start: 2016-06-27 — End: 2016-07-02

## 2016-06-27 MED ORDER — CEFTRIAXONE SODIUM 1 G IJ SOLR
1.0000 g | INTRAMUSCULAR | Status: DC
  Administered 2016-06-28: 1 g via INTRAVENOUS
  Filled 2016-06-27 (×2): qty 10

## 2016-06-27 MED ORDER — INSULIN ASPART 100 UNIT/ML ~~LOC~~ SOLN: 0.0000 [IU] | Freq: Three times a day (TID) | SUBCUTANEOUS | Status: DC

## 2016-06-27 MED ORDER — SODIUM CHLORIDE 0.9 % IV BOLUS (SEPSIS)
1000.0000 mL | Freq: Once | INTRAVENOUS | Status: AC
  Administered 2016-06-27: 1000 mL via INTRAVENOUS

## 2016-06-27 MED ORDER — POTASSIUM CHLORIDE CRYS ER 20 MEQ PO TBCR
40.0000 meq | EXTENDED_RELEASE_TABLET | Freq: Once | ORAL | Status: AC
  Administered 2016-06-27: 40 meq via ORAL
  Filled 2016-06-27: qty 2

## 2016-06-27 MED ORDER — POLYETHYLENE GLYCOL 3350 17 G PO PACK: 17.0000 g | PACK | Freq: Every day | ORAL | Status: DC | PRN

## 2016-06-27 MED ORDER — SODIUM CHLORIDE 0.9 % IV SOLN: 30.0000 meq | Freq: Once | INTRAVENOUS | Status: DC

## 2016-06-27 MED ORDER — ENOXAPARIN SODIUM 40 MG/0.4ML ~~LOC~~ SOLN
40.0000 mg | SUBCUTANEOUS | Status: DC
  Administered 2016-06-27 – 2016-07-01 (×5): 40 mg via SUBCUTANEOUS
  Filled 2016-06-27 (×5): qty 0.4

## 2016-06-27 MED ORDER — DEXTROSE 5 % IV SOLN
500.0000 mg | Freq: Once | INTRAVENOUS | Status: AC
  Administered 2016-06-27: 500 mg via INTRAVENOUS
  Filled 2016-06-27: qty 500

## 2016-06-27 MED ORDER — DEXTROSE 5 % IV SOLN
500.0000 mg | INTRAVENOUS | Status: DC
  Administered 2016-06-28 – 2016-06-29 (×2): 500 mg via INTRAVENOUS
  Filled 2016-06-27 (×2): qty 500

## 2016-06-27 MED ORDER — SODIUM CHLORIDE 0.9 % IV SOLN
INTRAVENOUS | Status: AC
  Administered 2016-06-27: 23:00:00 via INTRAVENOUS

## 2016-06-27 MED ORDER — ACETAMINOPHEN 500 MG PO TABS
1000.0000 mg | ORAL_TABLET | Freq: Once | ORAL | Status: AC
  Administered 2016-06-27: 1000 mg via ORAL
  Filled 2016-06-27: qty 2

## 2016-06-27 MED ORDER — SENNA 8.6 MG PO TABS
1.0000 | ORAL_TABLET | Freq: Two times a day (BID) | ORAL | Status: DC
  Administered 2016-06-27 – 2016-07-01 (×9): 8.6 mg via ORAL
  Filled 2016-06-27 (×11): qty 1

## 2016-06-27 MED ORDER — OXYCODONE-ACETAMINOPHEN 5-325 MG PO TABS: 1.0000 | ORAL_TABLET | Freq: Four times a day (QID) | ORAL | Status: DC | PRN

## 2016-06-27 MED ORDER — IPRATROPIUM-ALBUTEROL 0.5-2.5 (3) MG/3ML IN SOLN
3.0000 mL | Freq: Once | RESPIRATORY_TRACT | Status: AC
  Administered 2016-06-27: 3 mL via RESPIRATORY_TRACT
  Filled 2016-06-27: qty 3

## 2016-06-27 MED ORDER — ONDANSETRON HCL 4 MG PO TABS: 4.0000 mg | ORAL_TABLET | Freq: Four times a day (QID) | ORAL | Status: DC | PRN

## 2016-06-27 MED ORDER — ACETAMINOPHEN 650 MG RE SUPP: 650.0000 mg | Freq: Four times a day (QID) | RECTAL | Status: DC | PRN

## 2016-06-27 MED ORDER — GUAIFENESIN ER 600 MG PO TB12
600.0000 mg | ORAL_TABLET | Freq: Two times a day (BID) | ORAL | Status: DC
  Administered 2016-06-27 – 2016-07-02 (×10): 600 mg via ORAL
  Filled 2016-06-27 (×10): qty 1

## 2016-06-27 MED ORDER — DEXTROSE 5 % IV SOLN
1.0000 g | Freq: Once | INTRAVENOUS | Status: AC
  Administered 2016-06-27: 1 g via INTRAVENOUS
  Filled 2016-06-27: qty 10

## 2016-06-27 MED ORDER — BISACODYL 10 MG RE SUPP: 10.0000 mg | Freq: Every day | RECTAL | Status: DC | PRN

## 2016-06-27 MED ORDER — ONDANSETRON HCL 4 MG/2ML IJ SOLN: 4.0000 mg | Freq: Four times a day (QID) | INTRAMUSCULAR | Status: DC | PRN

## 2016-06-27 MED ORDER — SODIUM CHLORIDE 0.9 % IV BOLUS (SEPSIS)
500.0000 mL | Freq: Once | INTRAVENOUS | Status: AC
  Administered 2016-06-27: 500 mL via INTRAVENOUS

## 2016-06-27 MED ORDER — ASPIRIN EC 81 MG PO TBEC
81.0000 mg | DELAYED_RELEASE_TABLET | Freq: Every day | ORAL | Status: DC
  Administered 2016-06-28 – 2016-07-02 (×5): 81 mg via ORAL
  Filled 2016-06-27 (×5): qty 1

## 2016-06-27 NOTE — Progress Notes (Signed)
PHARMACY NOTE -  Ceftriaxone/azithromycin  Pharmacy has been assisting with dosing of ceftriaxone/azithromycin for CAP. Dosage remains stable at ceftriaxone 1 gr IV q24 and azithromycin 500 mg IV q24h  and need for further dosage adjustment appears unlikely at present.    Will sign off at this time.  Please reconsult if a change in clinical status warrants re-evaluation of dosage.   Royetta Asal, PharmD, BCPS Pager [phone] 06/27/2016 6:48 PM

## 2016-06-27 NOTE — ED Triage Notes (Signed)
Per EMS pt complaint of of weakness, productive cough, and fever/chills. Pt had recent URI untreated.

## 2016-06-27 NOTE — ED Notes (Signed)
Pt unable to urinate at this time.  

## 2016-06-27 NOTE — ED Notes (Signed)
RN at bedside starting IV/ collecting line

## 2016-06-27 NOTE — H&P (Signed)
Megan Ryan [id number] DOB: 08-16-1935 DOA: 06/27/2016     PCP: Philis Fendt, MD   Outpatient Specialists: none   Patient coming from:  home Lives alone,        Chief Complaint: Fever chills cough  HPI: Megan Ryan is a 81 y.o. female with medical history significant of HTN, DM 2, asthma, CHF    Presented with one-week history of URI symptoms with cough generalized fatigue decreased by mouth intake, nasal congestion. in the past 24 hours her symptoms have worsened and she developed a fever at home. Patient called EMS nothing seems to make symptoms better. Patient denies history of asthma but she is taking albuterol when necessary. Have not been wheezing. Reports minimal shortness of breath with any no sick contacts.   Regarding pertinent Chronic problems: Patient reports diet-controlled prediabetes. Denies history of heart failure or coronary artery disease  which she has arthritis and has troubles walking secondary to pain in her knees. Examination is Jehovah's Witness and refuses any blood products   IN ER:  Temp (24hrs), Avg:103.9 F (39.9 C), Min:102.3 F (39.1 C), Max:105.4 F (40.8 C)   Oxygen saturation down to 89% on room air currently on 2 L satting 94% RR 19 HR 98 Bp 105/55  Lactic acid 1.69  K 3.1Cr 1.1.6 up from baseline of 0.63  WBC 16.2  Hg 11.1 CXR showing cardiomegaly  And Left lower lobe atelectasis vs infiltrate  Following Medications were ordered in ER: Medications  sodium chloride 0.9 % bolus 1,000 mL (1,000 mLs Intravenous New Bag/Given 06/27/16 1828)    And  sodium chloride 0.9 % bolus 1,000 mL (1,000 mLs Intravenous New Bag/Given 06/27/16 1828)    And  sodium chloride 0.9 % bolus 1,000 mL (not administered)    And  sodium chloride 0.9 % bolus 500 mL (not administered)  cefTRIAXone (ROCEPHIN) 1 g in dextrose 5 % 50 mL IVPB (0 g Intravenous Stopped 06/27/16 1807)  azithromycin (ZITHROMAX) 500 mg in dextrose 5 % 250 mL IVPB (500 mg  Intravenous New Bag/Given 06/27/16 1737)  acetaminophen (TYLENOL) tablet 1,000 mg (1,000 mg Oral Given 06/27/16 1737)  ipratropium-albuterol (DUONEB) 0.5-2.5 (3) MG/3ML nebulizer solution 3 mL (3 mLs Nebulization Given 06/27/16 1741)      Hospitalist was called for admission for Sepsis secondary to community-acquired pneumonia associated with acute r t respiratory failure with hypoxia  Review of Systems:    Pertinent positives include: Fevers, chills, fatigue, shortness of breath at rest.  dyspnea on exertion,  productive cough, Constitutional:  No weight loss, night sweats,  weight loss  HEENT:  No headaches, Difficulty swallowing,Tooth/dental problems,Sore throat,  No sneezing, itching, ear ache, nasal congestion, post nasal drip,  Cardio-vascular:  No chest pain, Orthopnea, PND, anasarca, dizziness, palpitations.no Bilateral lower extremity swelling  GI:  No heartburn, indigestion, abdominal pain, nausea, vomiting, diarrhea, change in bowel habits, loss of appetite, melena, blood in stool, hematemesis Resp:  no No excess mucus, no  No non-productive cough, No coughing up of blood.No change in color of mucus.No wheezing. Skin:  no rash or lesions. No jaundice GU:  no dysuria, change in color of urine, no urgency or frequency. No straining to urinate.  No flank pain.  Musculoskeletal:  No joint pain or no joint swelling. No decreased range of motion. No back pain.  Psych:  No change in mood or affect. No depression or anxiety. No memory loss.  Neuro: no localizing neurological complaints, no tingling, no weakness, no  double vision, no gait abnormality, no slurred speech, no confusion  As per HPI otherwise 10 point review of systems negative.   Past Medical History: Past Medical History:  Diagnosis Date  . Hypertension    History reviewed. No pertinent surgical history.   Social History:  Ambulatory cane or walker     reports that she has never smoked. She has never used  smokeless tobacco. She reports that she does not drink alcohol or use drugs.  Allergies:  No Known Allergies     Family History:   Family History  Problem Relation Age of Onset  . Breast cancer Mother   . Diabetes Mother   . Diabetes Sister   . Prostate cancer Brother   . Diabetes Brother   . Diabetes Father   . CAD Neg Hx     Medications: Prior to Admission medications   Medication Sig Start Date End Date Taking? Authorizing Provider  amLODipine (NORVASC) 5 MG tablet Take 5 mg by mouth daily.   Yes Historical Provider, MD  aspirin EC 81 MG tablet Take 81 mg by mouth daily.   Yes Historical Provider, MD  oxyCODONE-acetaminophen (ROXICET) 5-325 MG tablet Take 1 tablet by mouth every 6 (six) hours as needed for moderate pain or severe pain. 07/11/15  Yes Kinnie Feil, MD  albuterol (PROVENTIL HFA;VENTOLIN HFA) 108 (90 BASE) MCG/ACT inhaler Inhale 1-2 puffs into the lungs every 6 (six) hours as needed for wheezing or shortness of breath. Patient not taking: Reported on 06/27/2016 03/19/13   Carmin Muskrat, MD  famotidine (PEPCID) 20 MG tablet Take 1 tablet (20 mg total) by mouth 2 (two) times daily. Patient not taking: Reported on 06/27/2016 05/17/16   Charlesetta Shanks, MD  linaclotide [facility]) 72 MCG capsule Take 72 mcg by mouth daily before breakfast.    Historical Provider, MD  ondansetron (ZOFRAN ODT) 4 MG disintegrating tablet Take 1 tablet (4 mg total) by mouth every 4 (four) hours as needed for nausea or vomiting. Patient not taking: Reported on 06/27/2016 05/17/16   Charlesetta Shanks, MD    Physical Exam: Patient Vitals for the past 24 hrs:  BP Temp Temp src Pulse Resp SpO2 Height Weight  06/27/16 1830 105/55 102.3 F (39.1 C) Oral 98 19 94 % - -  06/27/16 1816 103/91 - - 100 22 92 % - -  06/27/16 1747 153/95 - - 105 21 98 % - -  06/27/16 1741 - - - 104 20 93 % - -  06/27/16 1714 - - - - - - 5\' 7"  (1.702 m) 101.2 kg (223 lb)  06/27/16 1712 157/74 (!) 105.4 F (40.8 C)  Rectal 102 23 92 % - -  06/27/16 1709 - - - - - 94 % - -    1. General:  in No Acute distress 2. Psychological: Alert and   Oriented 3. Head/ENT:     Dry Mucous Membranes                          Head Non traumatic, neck supple                          Poor Dentition 4. SKIN:  decreased Skin turgor,  Skin clean Dry and intact no rash 5. Heart: Regular rate and rhythm no  Murmur, Rub or gallop 6. Lungs:   no wheezes mild  crackles   7. Abdomen: Soft,  non-tender, Non distended  8. Lower extremities: no clubbing, cyanosis, or edema 9. Neurologically Grossly intact, moving all 4 extremities equally  10. MSK: Normal range of motion   body mass index is 34.93 kg/m.  Labs on Admission:   Labs on Admission: I have personally reviewed following labs and imaging studies  CBC:  Recent Labs Lab 06/27/16 1723  WBC 16.2*  NEUTROABS 14.3*  HGB 11.1*  HCT 34.1*  MCV 93.4  PLT 99991111   Basic Metabolic Panel:  Recent Labs Lab 06/27/16 1723  NA 135  K 3.1*  CL 99*  CO2 25  GLUCOSE 172*  BUN 23*  CREATININE 1.16*  CALCIUM 8.7*   GFR: Estimated Creatinine Clearance: 48.8 mL/min (by C-G formula based on SCr of 1.16 mg/dL (H)). Liver Function Tests:  Recent Labs Lab 06/27/16 1723  AST 30  ALT 28  ALKPHOS 71  BILITOT 1.1  PROT 8.4*  ALBUMIN 3.2*   No results for input(s): LIPASE, AMYLASE in the last 168 hours. No results for input(s): AMMONIA in the last 168 hours. Coagulation Profile:  Recent Labs Lab 06/27/16 1723  INR 1.39   Cardiac Enzymes: No results for input(s): CKTOTAL, CKMB, CKMBINDEX, TROPONINI in the last 168 hours. BNP (last 3 results) No results for input(s): PROBNP in the last 8760 hours. HbA1C: No results for input(s): HGBA1C in the last 72 hours. CBG: No results for input(s): GLUCAP in the last 168 hours. Lipid Profile: No results for input(s): CHOL, HDL, LDLCALC, TRIG, CHOLHDL, LDLDIRECT in the last 72 hours. Thyroid Function Tests: No  results for input(s): TSH, T4TOTAL, FREET4, T3FREE, THYROIDAB in the last 72 hours. Anemia Panel: No results for input(s): VITAMINB12, FOLATE, FERRITIN, TIBC, IRON, RETICCTPCT in the last 72 hours.  Sepsis Labs: @LABRCNTIP (procalcitonin:4,lacticidven:4) )No results found for this or any previous visit (from the past 240 hour(s)).     UA  not ordered  No results found for: HGBA1C  Estimated Creatinine Clearance: 48.8 mL/min (by C-G formula based on SCr of 1.16 mg/dL (H)).  BNP (last 3 results) No results for input(s): PROBNP in the last 8760 hours.   ECG REPORT  Independently reviewed Rate: 100  Rhythm: sinus tachycardia possible left atrial enlargement ST&T Change: No acute ischemic changes  QTC 397  Filed Weights   06/27/16 1714  Weight: 101.2 kg (223 lb)     Cultures: No results found for: SDES, SPECREQUEST, CULT, REPTSTATUS   Radiological Exams on Admission: Dg Chest Port 1 View  Result Date: 06/27/2016 CLINICAL DATA:  Weakness, productive cough EXAM: PORTABLE CHEST 1 VIEW COMPARISON:  10/10/2015 FINDINGS: Cardiac silhouette is enlarged. Small LEFT effusion. LEFT basilar atelectasis versus infiltrate. No pneumothorax. IMPRESSION: LEFT lower lobe atelectasis versus infiltrate.  Small LEFT effusion. Cardiomegaly Electronically Signed   By: Suzy Bouchard M.D.   On: 06/27/2016 17:34    Chart has been reviewed    Assessment/Plan  81 y.o. female with medical history significant of HTN, DM 2 diet controlled, asthma, CHF she denies currently admitted for Sepsis secondary to community-acquired pneumonia associated with acute r t respiratory failure with hypoxia  Present on Admission: . Sepsis ([place]) mild currently hemodynamically stable. Will administer IV fluids start IV antibiotics lactic acid within normal limits . Acute respiratory failure with hypoxia ([name]) continuing oxygen will need to be evaluated for hypoxia with ambulation prior to discharge . CAP (community  acquired pneumonia) -  - will admit for treatment of CAP will start on appropriate antibiotic coverage.   Obtain sputum cultures, blood cultures if febrile  or if decompensates.  Provide oxygen as needed.   . Hypokalemia will replace obtain magnesium level . Dehydration  with administer IV fluids and follow clinically . Essential hypertension given soft blood pressure will hold Norvasc for today . Cardiomegaly obtain echogram to evaluate Diet controlled diabetes will order sliding scale hemoglobin A1c  Other plan as per orders.  DVT prophylaxis:   Lovenox     Code Status:    DNR/DNI  as per patient also no blood products  Family Communication:   Family at  Bedside  plan of care was discussed with GrandDaughter,    Disposition Plan:    To home once workup is complete and patient is stable                                           Consults called:none    Admission status   inpatient      Level of care      medical floor             I have spent a total of 34min on this admission    Josiel Gahm 06/27/2016, 7:28 PM    [facility]  Pager [phone]   after 2 AM please page floor coverage PA If 7AM-7PM, please contact the day team taking care of the patient  [email or web address]  Password [email or web address]

## 2016-06-27 NOTE — ED Notes (Signed)
Code sepsis called by Madaline Savage.

## 2016-06-27 NOTE — ED Notes (Signed)
Bed: [id number] Expected date:  Expected time:  Means of arrival:  Comments: Another EMS outside door

## 2016-06-27 NOTE — ED Provider Notes (Signed)
Medical screening examination/treatment/procedure(s) were conducted as a shared visit with non-physician practitioner(s) and myself.  I personally evaluated the patient during the encounter.  81 year old female with weakness for a few days. On my exam, she is slightly tachypneic but not hypoxic, Her respiratory rate and vital signs are normal. She has elevated white count and obvious pneumonia on her chest x-ray. Will start for community-acquired pneumonia and admitted to the hospital for further management.   Merrily Pew, MD 06/28/16 0030

## 2016-06-27 NOTE — ED Provider Notes (Signed)
[facility] DEPT Provider Note   CSN: [id number] Arrival date & time: 06/27/16  1651     History   Chief Complaint Chief Complaint  Patient presents with  . Weakness    HPI Megan Ryan is a 81 y.o. female.  HPI   Pt with hx HTN p/w 2 weeks of cough, gradually worsening, now productive with generalized weaknes, chills, fever.  Denies nasal congestion, sore throat, abdominal pain, V/D, urinary symptoms, leg swelling.   Pt denies recent hospitalizations, antibiotics use.    Past Medical History:  Diagnosis Date  . Diabetes mellitus ([place]) 06/27/2016  . Hypertension     Patient Active Problem List   Diagnosis Date Noted  . Sepsis ([place]) 06/27/2016  . Acute respiratory failure with hypoxia ([place]) 06/27/2016  . CAP (community acquired pneumonia) 06/27/2016  . Hypokalemia 06/27/2016  . Dehydration 06/27/2016  . Essential hypertension 06/27/2016  . Diabetes mellitus ([place]) 06/27/2016  . Cardiomegaly 06/27/2016  . No blood products 06/27/2016    History reviewed. No pertinent surgical history.  OB History    No data available       Home Medications    Prior to Admission medications   Medication Sig Start Date End Date Taking? Authorizing Provider  amLODipine (NORVASC) 5 MG tablet Take 5 mg by mouth daily.   Yes Historical Provider, MD  aspirin EC 81 MG tablet Take 81 mg by mouth daily.   Yes Historical Provider, MD  oxyCODONE-acetaminophen (ROXICET) 5-325 MG tablet Take 1 tablet by mouth every 6 (six) hours as needed for moderate pain or severe pain. 07/11/15  Yes Kinnie Feil, MD  albuterol (PROVENTIL HFA;VENTOLIN HFA) 108 (90 BASE) MCG/ACT inhaler Inhale 1-2 puffs into the lungs every 6 (six) hours as needed for wheezing or shortness of breath. Patient not taking: Reported on 06/27/2016 03/19/13   Carmin Muskrat, MD  famotidine (PEPCID) 20 MG tablet Take 1 tablet (20 mg total) by mouth 2 (two) times daily. Patient not taking: Reported on 06/27/2016 05/17/16   Charlesetta Shanks, MD  linaclotide [facility]) 72 MCG capsule Take 72 mcg by mouth daily before breakfast.    Historical Provider, MD  ondansetron (ZOFRAN ODT) 4 MG disintegrating tablet Take 1 tablet (4 mg total) by mouth every 4 (four) hours as needed for nausea or vomiting. Patient not taking: Reported on 06/27/2016 05/17/16   Charlesetta Shanks, MD    Family History Family History  Problem Relation Age of Onset  . Breast cancer Mother   . Diabetes Mother   . Diabetes Sister   . Prostate cancer Brother   . Diabetes Brother   . Diabetes Father   . CAD Neg Hx     Social History Social History  Substance Use Topics  . Smoking status: Never Smoker  . Smokeless tobacco: Never Used  . Alcohol use No     Allergies   Patient has no known allergies.   Review of Systems Review of Systems  All other systems reviewed and are negative.    Physical Exam Updated Vital Signs BP 123/72 (BP Location: Left Arm)   Pulse 86   Temp 98.9 F (37.2 C) (Oral)   Resp 20   Ht 5\' 7"  (1.702 m)   Wt 101.2 kg   SpO2 99%   BMI 34.93 kg/m   Physical Exam  Constitutional: She appears well-developed and well-nourished. No distress.  HENT:  Head: Normocephalic and atraumatic.  Neck: Neck supple.  Cardiovascular: Normal rate and regular rhythm.   Pulmonary/Chest:  Effort normal. No respiratory distress. She has decreased breath sounds. She has no wheezes. She has rales.  Abdominal: Soft. She exhibits no distension. There is no tenderness. There is no rebound and no guarding.  Neurological: She is alert.  Skin: She is not diaphoretic.  Nursing note and vitals reviewed.    ED Treatments / Results  Labs (all labs ordered are listed, but only abnormal results are displayed) Labs Reviewed  COMPREHENSIVE METABOLIC PANEL - Abnormal; Notable for the following:       Result Value   Potassium 3.1 (*)    Chloride 99 (*)    Glucose, Bld 172 (*)    BUN 23 (*)    Creatinine, Ser 1.16 (*)    Calcium 8.7 (*)      Total Protein 8.4 (*)    Albumin 3.2 (*)    GFR calc non Af Amer 44 (*)    GFR calc Af Amer 51 (*)    All other components within normal limits  CBC WITH DIFFERENTIAL/PLATELET - Abnormal; Notable for the following:    WBC 16.2 (*)    RBC 3.65 (*)    Hemoglobin 11.1 (*)    HCT 34.1 (*)    Neutro Abs 14.3 (*)    All other components within normal limits  PROTIME-INR - Abnormal; Notable for the following:    Prothrombin Time 17.2 (*)    All other components within normal limits  CULTURE, BLOOD (ROUTINE X 2)  CULTURE, BLOOD (ROUTINE X 2)  URINE CULTURE  CULTURE, EXPECTORATED SPUTUM-ASSESSMENT  GRAM STAIN  URINALYSIS, ROUTINE W REFLEX MICROSCOPIC  INFLUENZA PANEL BY PCR (TYPE A & B, H1N1)  PROTEIN ELECTROPHORESIS, SERUM  HEMOGLOBIN A1C  MAGNESIUM  STREP PNEUMONIAE URINARY ANTIGEN  PROCALCITONIN  MAGNESIUM  PHOSPHORUS  TSH  COMPREHENSIVE METABOLIC PANEL  CBC  I-STAT CG4 LACTIC ACID, ED  I-STAT CG4 LACTIC ACID, ED  CG4 I-STAT (LACTIC ACID)    EKG  EKG Interpretation  Date/Time:  Monday June 27 2016 17:11:30 EST Ventricular Rate:  100 PR Interval:    QRS Duration: 96 QT Interval:  306 QTC Calculation: 397 R Axis:   9 Text Interpretation:  Sinus tachycardia Probable left atrial enlargement Inferior infarct, old Probable anterior infarct, age indeterminate Baseline wander in lead(s) V1 aside from rate no significant change Confirmed by [facility] MD, JASON [phone]) on 06/27/2016 6:03:03 PM       Radiology Dg Chest Port 1 View  Result Date: 06/27/2016 CLINICAL DATA:  Weakness, productive cough EXAM: PORTABLE CHEST 1 VIEW COMPARISON:  10/10/2015 FINDINGS: Cardiac silhouette is enlarged. Small LEFT effusion. LEFT basilar atelectasis versus infiltrate. No pneumothorax. IMPRESSION: LEFT lower lobe atelectasis versus infiltrate.  Small LEFT effusion. Cardiomegaly Electronically Signed   By: Suzy Bouchard M.D.   On: 06/27/2016 17:34    Procedures Procedures (including  critical care time)  Medications Ordered in ED Medications  azithromycin (ZITHROMAX) 500 mg in dextrose 5 % 250 mL IVPB (not administered)  cefTRIAXone (ROCEPHIN) 1 g in dextrose 5 % 50 mL IVPB (not administered)  oxyCODONE-acetaminophen (PERCOCET/ROXICET) 5-325 MG per tablet 1 tablet (not administered)  aspirin EC tablet 81 mg (not administered)  acetaminophen (TYLENOL) tablet 650 mg (not administered)    Or  acetaminophen (TYLENOL) suppository 650 mg (not administered)  ondansetron (ZOFRAN) tablet 4 mg (not administered)    Or  ondansetron (ZOFRAN) injection 4 mg (not administered)  enoxaparin (LOVENOX) injection 40 mg (not administered)  0.9 %  sodium chloride infusion (not administered)  guaiFENesin (  MUCINEX) 12 hr tablet 600 mg (not administered)  polyethylene glycol (MIRALAX / GLYCOLAX) packet 17 g (not administered)  senna (SENOKOT) tablet 8.6 mg (not administered)  bisacodyl (DULCOLAX) suppository 10 mg (not administered)  insulin aspart (novoLOG) injection 0-5 Units (not administered)  insulin aspart (novoLOG) injection 0-9 Units (not administered)  potassium chloride SA (K-DUR,KLOR-CON) CR tablet 40 mEq (not administered)  potassium chloride 10 mEq in 100 mL IVPB (not administered)  cefTRIAXone (ROCEPHIN) 1 g in dextrose 5 % 50 mL IVPB (0 g Intravenous Stopped 06/27/16 1807)  azithromycin (ZITHROMAX) 500 mg in dextrose 5 % 250 mL IVPB (0 mg Intravenous Stopped 06/27/16 1902)  acetaminophen (TYLENOL) tablet 1,000 mg (1,000 mg Oral Given 06/27/16 1737)  ipratropium-albuterol (DUONEB) 0.5-2.5 (3) MG/3ML nebulizer solution 3 mL (3 mLs Nebulization Given 06/27/16 1741)  sodium chloride 0.9 % bolus 1,000 mL (0 mLs Intravenous Stopped 06/27/16 1947)    And  sodium chloride 0.9 % bolus 1,000 mL (0 mLs Intravenous Stopped 06/27/16 1947)    And  sodium chloride 0.9 % bolus 1,000 mL (0 mLs Intravenous Stopped 06/27/16 1947)    And  sodium chloride 0.9 % bolus 500 mL (500 mLs Intravenous New  Bag/Given 06/27/16 2000)     Initial Impression / Assessment and Plan / ED Course  I have reviewed the triage vital signs and the nursing notes.  Pertinent labs & imaging results that were available during my care of the patient were reviewed by me and considered in my medical decision making (see chart for details).  Clinical Course as of Jun 27 2101  Mon Jun 27, 2016  1844 Pt reevaluated.  She is awake and alert, talking with family.  O2 sat 89-90% on room air.  Placed no nasal canula.  Temp improving.  Admitted to [facility], Dr Roel Cluck accepting.    [EW]    Clinical Course User Index [EW] Clayton Bibles, PA-C    Pt with fever, mild tachycardia, hypoxia , 2 weeks of URI, gradually worsening.  Pneumonia clinically and on CXR, started on sepsis protocol, CAP antibiotics.  Admitted to [facility].    Final Clinical Impressions(s) / ED Diagnoses   Final diagnoses:  Sepsis, due to unspecified organism [facility])  Community acquired pneumonia of left lower lobe of lung [facility])    New Prescriptions Current Discharge Medication List       Clayton Bibles, [place] 06/27/16 2109    Merrily Pew, MD 06/30/16 [phone]

## 2016-06-27 NOTE — ED Notes (Signed)
Both sets of blood cultures have been drawn. L fa and R AC.

## 2016-06-28 ENCOUNTER — Inpatient Hospital Stay (HOSPITAL_COMMUNITY): Payer: Medicare Other

## 2016-06-28 ENCOUNTER — Encounter (HOSPITAL_COMMUNITY): Payer: Self-pay

## 2016-06-28 DIAGNOSIS — G9340 Encephalopathy, unspecified: Secondary | ICD-10-CM

## 2016-06-28 DIAGNOSIS — I517 Cardiomegaly: Secondary | ICD-10-CM

## 2016-06-28 DIAGNOSIS — N390 Urinary tract infection, site not specified: Secondary | ICD-10-CM

## 2016-06-28 LAB — PROCALCITONIN: Procalcitonin: 5.3 ng/mL

## 2016-06-28 LAB — COMPREHENSIVE METABOLIC PANEL
ALBUMIN: 2.8 g/dL — AB (ref 3.5–5.0)
ALT: 26 U/L (ref 14–54)
AST: 34 U/L (ref 15–41)
Alkaline Phosphatase: 63 U/L (ref 38–126)
Anion gap: 9 (ref 5–15)
BILIRUBIN TOTAL: 0.6 mg/dL (ref 0.3–1.2)
BUN: 19 mg/dL (ref 6–20)
CHLORIDE: 106 mmol/L (ref 101–111)
CO2: 23 mmol/L (ref 22–32)
Calcium: 8.4 mg/dL — ABNORMAL LOW (ref 8.9–10.3)
Creatinine, Ser: 0.68 mg/dL (ref 0.44–1.00)
GFR calc Af Amer: >60 mL/min (ref >60)
GFR calc non Af Amer: >60 mL/min (ref >60)
GLUCOSE: 92 mg/dL (ref 65–99)
POTASSIUM: 3.9 mmol/L (ref 3.5–5.1)
SODIUM: 138 mmol/L (ref 135–145)
TOTAL PROTEIN: 7.8 g/dL (ref 6.5–8.1)

## 2016-06-28 LAB — URINALYSIS, ROUTINE W REFLEX MICROSCOPIC
Bilirubin Urine: NEGATIVE
Glucose, UA: NEGATIVE mg/dL
Ketones, ur: NEGATIVE mg/dL
Nitrite: NEGATIVE
PH: 5 (ref 5.0–8.0)
Protein, ur: 30 mg/dL — AB
SPECIFIC GRAVITY, URINE: 1.019 (ref 1.005–1.030)

## 2016-06-28 LAB — CBC
HEMATOCRIT: 32.4 % — AB (ref 36.0–46.0)
Hemoglobin: 10.3 g/dL — ABNORMAL LOW (ref 12.0–15.0)
MCH: 30 pg (ref 26.0–34.0)
MCHC: 31.8 g/dL (ref 30.0–36.0)
MCV: 94.5 fL (ref 78.0–100.0)
Platelets: 203 10*3/uL (ref 150–400)
RBC: 3.43 MIL/uL — ABNORMAL LOW (ref 3.87–5.11)
RDW: 15.1 % (ref 11.5–15.5)
WBC: 15.3 10*3/uL — ABNORMAL HIGH (ref 4.0–10.5)

## 2016-06-28 LAB — ECHOCARDIOGRAM COMPLETE
Height: 67 in
Weight: 3568 oz

## 2016-06-28 LAB — PHOSPHORUS: PHOSPHORUS: 1.5 mg/dL — AB (ref 2.5–4.6)

## 2016-06-28 LAB — MAGNESIUM: MAGNESIUM: 2.1 mg/dL (ref 1.7–2.4)

## 2016-06-28 LAB — INFLUENZA PANEL BY PCR (TYPE A & B)
INFLBPCR: NEGATIVE
Influenza A By PCR: NEGATIVE

## 2016-06-28 LAB — GLUCOSE, CAPILLARY
GLUCOSE-CAPILLARY: 90 mg/dL (ref 65–99)
GLUCOSE-CAPILLARY: 79 mg/dL (ref 65–99)
Glucose-Capillary: 80 mg/dL (ref 65–99)
Glucose-Capillary: 90 mg/dL (ref 65–99)

## 2016-06-28 LAB — TSH: TSH: 1.111 u[IU]/mL (ref 0.350–4.500)

## 2016-06-28 LAB — BRAIN NATRIURETIC PEPTIDE: B NATRIURETIC PEPTIDE 5: 141 pg/mL — AB (ref 0.0–100.0)

## 2016-06-28 LAB — STREP PNEUMONIAE URINARY ANTIGEN: Strep Pneumo Urinary Antigen: NEGATIVE

## 2016-06-28 NOTE — Progress Notes (Signed)
  Echocardiogram 2D Echocardiogram has been performed.  Megan Ryan 06/28/2016, 4:26 PM

## 2016-06-28 NOTE — Progress Notes (Signed)
PROGRESS NOTE  Megan Ryan RFF:[id number] DOB: 1936-01-14 DOA: 06/27/2016 PCP: Philis Fendt, MD  HPI/Recap of past 24 hours: 81 year female with past medical history of obesity, diastolic heart failure and diabetes admitted on 1/1 after having one week of upper respiratory symptoms and came in with confusion and found to have acute sepsis secondary to pneumonia. Following admission, also found to have large urinary tract infection. Patient treated with IV fluids and antibiotics.  Following day, patient much improved. More alert and not requiring oxygen. Blood pressure stabilized.   Assessment/Plan: Active Problems:   Sepsis ([place]) secondary to community-acquired pneumonia and UTI. Patient met criteria on admission given leukocytosis, high fever, tachypnea, respiratory failure, tachycardia and mild acute kidney injury with source being pulmonary infiltrate as well as UTI.  Sepsis now resolved. Patient more stable. Continue IV antibiotics until white blood cell count normalizes. Awaiting urine cultures.   Acute respiratory failure with hypoxia [facility]): Initially oxygen saturations at 89% on admission on room air and with oxygen, up to 96%. By following day, able to maintain oxygen saturation greater than 95% on room air   acute encephalopathy: Secondary to sepsis: Improving    Hypokalemia: Replacing   Dehydration   Essential hypertension: Holding antihypertensives initially due to soft blood pressures. We'll gently restart his blood pressure elevates   Diabetes mellitus ([place]): Blood sugar stable.   Cardiomegaly   No blood products: Patient is a Jehovah's Witness Mild tremor: More pronounced. Worsening may be from sepsis. We'll continue to monitor closely  Code Status: DO NOT RESUSCITATE   Family Communication: Granddaughter and great granddaughter at bedside   Disposition Plan: Rapid turnaround. Likely will still be in hospital for the next one to 2 days as white count improves.     Consultants:  None   Procedures:  None   Antimicrobials:  IV Rocephin and Zithromax   DVT prophylaxis: Lovenox   Objective: Vitals:   06/27/16 1953 06/27/16 2045 06/28/16 0429 06/28/16 1300  BP: (!) 125/54 123/72 (!) 196/78 (!) 170/78  Pulse: 82 86 99 96  Resp: 20 20 20 20   Temp: 99.7 F (37.6 C) 98.9 F (37.2 C) 99.8 F (37.7 C) 98.3 F (36.8 C)  TempSrc: Oral Oral Oral Oral  SpO2: 94% 99% 96% 98%  Weight:      Height:        Intake/Output Summary (Last 24 hours) at 06/28/16 1447 Last data filed at 06/28/16 1300  Gross per 24 hour  Intake             4205 ml  Output              350 ml  Net             3855 ml   Filed Weights   06/27/16 1714  Weight: 101.2 kg (223 lb)    Exam:   General:  Alert and oriented 2, no acute distress   Cardiovascular: Regular rate and rhythm,    Respiratory: Decreased breath sounds right base   Abdomen: Soft, nontender, nontender; positive bowel sounds   Musculoskeletal: No clubbing or cyanosis, trace pitting edema   Skin: No skin breaks, tears or lesions  Psychiatry: Patient is appropriate, no evidence of psychoses    Data Reviewed: CBC:  Recent Labs Lab 06/27/16 1723 06/28/16 0529  WBC 16.2* 15.3*  NEUTROABS 14.3*  --   HGB 11.1* 10.3*  HCT 34.1* 32.4*  MCV 93.4 94.5  PLT 218 203   Basic  Metabolic Panel:  Recent Labs Lab 06/27/16 1723 06/27/16 2225 06/28/16 0529  NA 135  --  138  K 3.1*  --  3.9  CL 99*  --  106  CO2 25  --  23  GLUCOSE 172*  --  92  BUN 23*  --  19  CREATININE 1.16*  --  0.68  CALCIUM 8.7*  --  8.4*  MG  --  1.9 2.1  PHOS  --   --  1.5*   GFR: Estimated Creatinine Clearance: 70.8 mL/min (by C-G formula based on SCr of 0.68 mg/dL). Liver Function Tests:  Recent Labs Lab 06/27/16 1723 06/28/16 0529  AST 30 34  ALT 28 26  ALKPHOS 71 63  BILITOT 1.1 0.6  PROT 8.4* 7.8  ALBUMIN 3.2* 2.8*   No results for input(s): LIPASE, AMYLASE in the last 168 hours. No  results for input(s): AMMONIA in the last 168 hours. Coagulation Profile:  Recent Labs Lab 06/27/16 1723  INR 1.39   Cardiac Enzymes: No results for input(s): CKTOTAL, CKMB, CKMBINDEX, TROPONINI in the last 168 hours. BNP (last 3 results) No results for input(s): PROBNP in the last 8760 hours. HbA1C: No results for input(s): HGBA1C in the last 72 hours. CBG:  Recent Labs Lab 06/27/16 2320 06/28/16 0738 06/28/16 1108  GLUCAP 139* 90 79   Lipid Profile: No results for input(s): CHOL, HDL, LDLCALC, TRIG, CHOLHDL, LDLDIRECT in the last 72 hours. Thyroid Function Tests:  Recent Labs  06/28/16 0529  TSH 1.111   Anemia Panel: No results for input(s): VITAMINB12, FOLATE, FERRITIN, TIBC, IRON, RETICCTPCT in the last 72 hours. Urine analysis:    Component Value Date/Time   COLORURINE YELLOW 06/28/2016 0753   APPEARANCEUR CLOUDY (A) 06/28/2016 0753   LABSPEC 1.019 06/28/2016 0753   PHURINE 5.0 06/28/2016 0753   GLUCOSEU NEGATIVE 06/28/2016 0753   HGBUR MODERATE (A) 06/28/2016 0753   BILIRUBINUR NEGATIVE 06/28/2016 0753   KETONESUR NEGATIVE 06/28/2016 0753   PROTEINUR 30 (A) 06/28/2016 0753   NITRITE NEGATIVE 06/28/2016 0753   LEUKOCYTESUR LARGE (A) 06/28/2016 0753   Sepsis Labs: @LABRCNTIP (procalcitonin:4,lacticidven:4)  )No results found for this or any previous visit (from the past 240 hour(s)).    Studies: Dg Chest Port 1 View  Result Date: 06/27/2016 CLINICAL DATA:  Weakness, productive cough EXAM: PORTABLE CHEST 1 VIEW COMPARISON:  10/10/2015 FINDINGS: Cardiac silhouette is enlarged. Small LEFT effusion. LEFT basilar atelectasis versus infiltrate. No pneumothorax. IMPRESSION: LEFT lower lobe atelectasis versus infiltrate.  Small LEFT effusion. Cardiomegaly Electronically Signed   By: Suzy Bouchard M.D.   On: 06/27/2016 17:34    Scheduled Meds: . aspirin EC  81 mg Oral Daily  . azithromycin  500 mg Intravenous Q24H  . cefTRIAXone (ROCEPHIN)  IV  1 g  Intravenous Q24H  . enoxaparin (LOVENOX) injection  40 mg Subcutaneous Q24H  . guaiFENesin  600 mg Oral BID  . insulin aspart  0-5 Units Subcutaneous QHS  . insulin aspart  0-9 Units Subcutaneous TID WC  . senna  1 tablet Oral BID    Continuous Infusions:   LOS: 1 day    Annita Brod, MD [facility] Pager [phone]  If 7PM-7AM, please contact night-coverage [email or web address] Password [facility] 06/28/2016, 2:47 PM

## 2016-06-28 NOTE — CV Procedure (Signed)
2D echo attempted but patient and family wanted to eat, will try later

## 2016-06-29 ENCOUNTER — Inpatient Hospital Stay (HOSPITAL_COMMUNITY): Payer: Medicare Other

## 2016-06-29 DIAGNOSIS — N390 Urinary tract infection, site not specified: Secondary | ICD-10-CM | POA: Diagnosis present

## 2016-06-29 DIAGNOSIS — G252 Other specified forms of tremor: Secondary | ICD-10-CM | POA: Diagnosis present

## 2016-06-29 DIAGNOSIS — I5032 Chronic diastolic (congestive) heart failure: Secondary | ICD-10-CM | POA: Diagnosis present

## 2016-06-29 LAB — URINE CULTURE

## 2016-06-29 LAB — PROTEIN ELECTROPHORESIS, SERUM
A/G RATIO SPE: 0.5 — AB (ref 0.7–1.7)
ALPHA-2-GLOBULIN: 1 g/dL (ref 0.4–1.0)
Albumin ELP: 2.3 g/dL — ABNORMAL LOW (ref 2.9–4.4)
Alpha-1-Globulin: 0.4 g/dL (ref 0.0–0.4)
Beta Globulin: 1 g/dL (ref 0.7–1.3)
GLOBULIN, TOTAL: 4.6 g/dL — AB (ref 2.2–3.9)
Gamma Globulin: 2.2 g/dL — ABNORMAL HIGH (ref 0.4–1.8)
PDF SPE: 0
Total Protein ELP: 6.9 g/dL (ref 6.0–8.5)

## 2016-06-29 LAB — HEMOGLOBIN A1C
Hgb A1c MFr Bld: 6.2 % — ABNORMAL HIGH (ref 4.8–5.6)
MEAN PLASMA GLUCOSE: 131 mg/dL

## 2016-06-29 LAB — GLUCOSE, CAPILLARY
GLUCOSE-CAPILLARY: 76 mg/dL (ref 65–99)
GLUCOSE-CAPILLARY: 83 mg/dL (ref 65–99)
GLUCOSE-CAPILLARY: 121 mg/dL — AB (ref 65–99)
Glucose-Capillary: 111 mg/dL — ABNORMAL HIGH (ref 65–99)

## 2016-06-29 LAB — PROCALCITONIN: Procalcitonin: 4.24 ng/mL

## 2016-06-29 MED ORDER — SODIUM CHLORIDE 0.9 % IV SOLN
INTRAVENOUS | Status: DC
  Administered 2016-06-29: 15:00:00 via INTRAVENOUS

## 2016-06-29 MED ORDER — PROPRANOLOL HCL ER 60 MG PO CP24
60.0000 mg | ORAL_CAPSULE | Freq: Every day | ORAL | Status: DC
  Administered 2016-06-29 – 2016-07-02 (×4): 60 mg via ORAL
  Filled 2016-06-29 (×4): qty 1

## 2016-06-29 MED ORDER — VANCOMYCIN HCL 10 G IV SOLR
2000.0000 mg | Freq: Once | INTRAVENOUS | Status: AC
  Administered 2016-06-29: 2000 mg via INTRAVENOUS
  Filled 2016-06-29: qty 2000

## 2016-06-29 MED ORDER — IOPAMIDOL (ISOVUE-300) INJECTION 61%
INTRAVENOUS | Status: AC
Start: 2016-06-29 — End: 2016-06-30
  Filled 2016-06-29: qty 100

## 2016-06-29 MED ORDER — PIPERACILLIN-TAZOBACTAM 3.375 G IVPB
3.3750 g | Freq: Three times a day (TID) | INTRAVENOUS | Status: DC
  Administered 2016-06-29 – 2016-07-01 (×5): 3.375 g via INTRAVENOUS
  Filled 2016-06-29 (×5): qty 50

## 2016-06-29 MED ORDER — VANCOMYCIN HCL IN DEXTROSE 750-5 MG/150ML-% IV SOLN
750.0000 mg | Freq: Two times a day (BID) | INTRAVENOUS | Status: DC
  Administered 2016-06-30 – 2016-07-01 (×3): 750 mg via INTRAVENOUS
  Filled 2016-06-29 (×3): qty 150

## 2016-06-29 MED ORDER — IOPAMIDOL (ISOVUE-300) INJECTION 61%
100.0000 mL | Freq: Once | INTRAVENOUS | Status: AC | PRN
  Administered 2016-06-29: 100 mL via INTRAVENOUS

## 2016-06-29 MED ORDER — LACTULOSE 10 GM/15ML PO SOLN
10.0000 g | ORAL | Status: AC
  Administered 2016-06-29: 10 g via ORAL
  Filled 2016-06-29 (×2): qty 15

## 2016-06-29 MED ORDER — K PHOS MONO-SOD PHOS DI & MONO 155-852-130 MG PO TABS
500.0000 mg | ORAL_TABLET | Freq: Four times a day (QID) | ORAL | Status: AC
  Administered 2016-06-29 – 2016-06-30 (×4): 500 mg via ORAL
  Filled 2016-06-29 (×4): qty 2

## 2016-06-29 NOTE — Progress Notes (Signed)
Pharmacy Antibiotic Note  Megan Ryan is a 81 y.o. female admitted on 06/27/2016 with sepsis 2/2 CAP and UTI.  Pt started on Ceftriaxone and azithromycin initially but given markedly high temps on admission and continued fevers while on antibiotics, MD broadening therapy 1/3.  Pharmacy has been consulted for Vancomycin and Zosyn dosing.  PCT remains elevated.  Plan: Vancomycin 2g IV x 1 then 750 mg IV q12h Zosyn 3.375g IV q8h (4 hour infusion time).    Height: 5\' 7"  (170.2 cm) Weight: 223 lb 1.7 oz (101.2 kg) IBW/kg (Calculated) : 61.6  Temp (24hrs), Avg:100.2 F (37.9 C), Min:98.7 F (37.1 C), Max:101.4 F (38.6 C)   Recent Labs Lab 06/27/16 1723 06/27/16 1732 06/27/16 2033 06/28/16 0529  WBC 16.2*  --   --  15.3*  CREATININE 1.16*  --   --  0.68  LATICACIDVEN  --  1.69 1.36  --     Estimated Creatinine Clearance: 70.8 mL/min (by C-G formula based on SCr of 0.68 mg/dL).    No Known Allergies  Antimicrobials this admission: 1/1 azithromycin >> 1/3 1/1 ceftriaxone >> 1/3 1/3 vancomycin >> 1/3 zosyn >>  Dose adjustments this admission: -  Microbiology results: 1/1 BCx: ngtd 1/1 UCx: mult species  1/2 strept pneumo ur ag: neg 1/2 influenza neg  Thank you for allowing pharmacy to be a part of this patient's care.  Hershal Coria 06/29/2016 6:20 PM

## 2016-06-29 NOTE — Progress Notes (Addendum)
PROGRESS NOTE  Megan Ryan DXI:[id number] DOB: [birth year] DOA: 06/27/2016 PCP: Philis Fendt, MD  HPI/Recap of past 24 hours: 81 year female with past medical history of obesity, diastolic heart failure and diabetes admitted on 1/1 after having one week of upper respiratory symptoms and came in with confusion and found to have acute sepsis secondary to pneumonia. Following admission, also found to have large urinary tract infection. Patient treated with IV fluids and antibiotics.  By 1/2, patient much improved. More alert and not requiring oxygen. Blood pressure stabilized. Today, patient continues to slowly improve. Had spiking fever overnight. Still feels weak. Echocardiogram done confirmed chronic diastolic heart failure. Pro calcitonin this morning slowly trending downward.  Assessment/Plan: Active Problems:   Sepsis ([place]) secondary to community-acquired pneumonia and UTI. Patient met criteria on admission given leukocytosis, high fever, tachypnea, respiratory failure, tachycardia and mild acute kidney injury with source being pulmonary infiltrate as well as UTI.  Sepsis now resolved. Patient more stable. Continue IV antibiotics until white blood cell count normalizes. Awaiting urine cultures.   Given that patient had markedly high temperatures on admission and still spiked fever despite initiation of antibiotics, check CT scan of abdomen and pelvis to rule out pyelonephritis. This was negative. She had another low-grade temperature and afternoon so antibiotics changed to Zosyn and vancomycin.    Acute respiratory failure with hypoxia [facility]): Initially oxygen saturations at 89% on admission on room air and with oxygen, up to 96%. By following day, able to maintain oxygen saturation greater than 95% on room air   acute encephalopathy: Secondary to sepsis: Improving Hypophosphatemia: Replacing.    Hypokalemia: Replacing   Dehydration   Essential hypertension: Holding antihypertensives  initially due to soft blood pressures. We'll gently restart as her blood pressure elevates.   Diabetes mellitus ([place]): Blood sugar stable.    Cardiomegaly: Noted, confirmed diastolic heart failure on echo.    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 Suspect worsening may be secondary to weakness and illness from sepsis. Observed this myself when she was trying to eat. We'll start propranolol. Noted thyroid level normal.  Code Status: DO NOT RESUSCITATE   Family Communication: Discussed with granddaughter and grandson at the bedside  Disposition Plan: Slowly improving. Anticipate discharge once white count normalizes and no further fevers. Likely will be here for at least several more days.   Consultants:  None   Procedures:  None   Antimicrobials:  IV Rocephin and Zithromax 1/1-1/3  IV Zosyn and Vanco 1/3-present  DVT prophylaxis: Lovenox   Objective: Vitals:   06/28/16 1300 06/28/16 2108 06/29/16 0438 06/29/16 1426  BP: (!) 170/78 (!) 128/91 118/76 (!) 143/87  Pulse: 96 95 79 100  Resp: 20 (!) 22 20 20   Temp: 98.3 F (36.8 C) (!) 101.4 F (38.6 C) 98.7 F (37.1 C) (!) 100.5 F (38.1 C)  TempSrc: Oral Oral Oral Oral  SpO2: 98% 96% 99% 99%  Weight:      Height:        Intake/Output Summary (Last 24 hours) at 06/29/16 1440 Last data filed at 06/29/16 0800  Gross per 24 hour  Intake              540 ml  Output              350 ml  Net              190 ml   Filed Weights   06/27/16 1714  Weight: 101.2 kg (223 lb)    Exam:   General:  Alert and oriented 2, no acute distress   Cardiovascular: Regular rate and rhythm,    Respiratory: Decreased breath sounds right base   Abdomen: Soft, nontender, nontender; positive bowel sounds   Musculoskeletal: No clubbing or cyanosis, trace pitting edema   Skin: No skin breaks, tears or lesions  Psychiatry: Patient is appropriate, no evidence of psychoses   Neuro:  Noted severe shaking of upper extremities when she tried to eat, especially at the end of action   Data Reviewed: CBC:  Recent Labs Lab 06/27/16 1723 06/28/16 0529  WBC 16.2* 15.3*  NEUTROABS 14.3*  --   HGB 11.1* 10.3*  HCT 34.1* 32.4*  MCV 93.4 94.5  PLT 218 295   Basic Metabolic Panel:  Recent Labs Lab 06/27/16 1723 06/27/16 2225 06/28/16 0529  NA 135  --  138  K 3.1*  --  3.9  CL 99*  --  106  CO2 25  --  23  GLUCOSE 172*  --  92  BUN 23*  --  19  CREATININE 1.16*  --  0.68  CALCIUM 8.7*  --  8.4*  MG  --  1.9 2.1  PHOS  --   --  1.5*   GFR: Estimated Creatinine Clearance: 70.8 mL/min (by C-G formula based on SCr of 0.68 mg/dL). Liver Function Tests:  Recent Labs Lab 06/27/16 1723 06/28/16 0529  AST 30 34  ALT 28 26  ALKPHOS 71 63  BILITOT 1.1 0.6  PROT 8.4* 7.8  ALBUMIN 3.2* 2.8*   No results for input(s): LIPASE, AMYLASE in the last 168 hours. No results for input(s): AMMONIA in the last 168 hours. Coagulation Profile:  Recent Labs Lab 06/27/16 1723  INR 1.39   Cardiac Enzymes: No results for input(s): CKTOTAL, CKMB, CKMBINDEX, TROPONINI in the last 168 hours. BNP (last 3 results) No results for input(s): PROBNP in the last 8760 hours. HbA1C:  Recent Labs  06/28/16 0529  HGBA1C 6.2*   CBG:  Recent Labs Lab 06/28/16 1108 06/28/16 1637 06/28/16 2111 06/29/16 0731 06/29/16 1128  GLUCAP 79 80 90 76 83   Lipid Profile: No results for input(s): CHOL, HDL, LDLCALC, TRIG, CHOLHDL, LDLDIRECT in the last 72 hours. Thyroid Function Tests:  Recent Labs  06/28/16 0529  TSH 1.111   Anemia Panel: No results for input(s): VITAMINB12, FOLATE, FERRITIN, TIBC, IRON, RETICCTPCT in the last 72 hours. Urine analysis:    Component Value Date/Time   COLORURINE YELLOW 06/28/2016 0753   APPEARANCEUR CLOUDY (A) 06/28/2016 0753   LABSPEC 1.019 06/28/2016 0753   PHURINE 5.0 06/28/2016 0753   GLUCOSEU NEGATIVE 06/28/2016 0753   HGBUR  MODERATE (A) 06/28/2016 0753   BILIRUBINUR NEGATIVE 06/28/2016 0753   KETONESUR NEGATIVE 06/28/2016 0753   PROTEINUR 30 (A) 06/28/2016 0753   NITRITE NEGATIVE 06/28/2016 0753   LEUKOCYTESUR LARGE (A) 06/28/2016 0753   Sepsis Labs: @LABRCNTIP (procalcitonin:4,lacticidven:4)  ) Recent Results (from the past 240 hour(s))  Culture, blood (Routine x 2)     Status: None (Preliminary result)   Collection Time: 06/27/16  5:23 PM  Result Value Ref Range Status   Specimen Description RIGHT ANTECUBITAL  Final   Special Requests BOTTLES DRAWN AEROBIC AND ANAEROBIC 5CC  Final   Culture   Final    NO GROWTH < 24 HOURS Performed at [facility]    Report Status PENDING  Incomplete  Culture, blood (Routine x 2)     Status:  None (Preliminary result)   Collection Time: 06/27/16  5:24 PM  Result Value Ref Range Status   Specimen Description BLOOD BLOOD LEFT FOREARM  Final   Special Requests BOTTLES DRAWN AEROBIC AND ANAEROBIC 5 CC  Final   Culture   Final    NO GROWTH < 24 HOURS Performed at [facility]    Report Status PENDING  Incomplete  Urine culture     Status: Abnormal   Collection Time: 06/28/16  7:53 AM  Result Value Ref Range Status   Specimen Description URINE, CLEAN CATCH  Final   Special Requests NONE  Final   Culture MULTIPLE SPECIES PRESENT, SUGGEST RECOLLECTION (A)  Final   Report Status 06/29/2016 FINAL  Final      Studies: No results found.  Scheduled Meds: . aspirin EC  81 mg Oral Daily  . azithromycin  500 mg Intravenous Q24H  . cefTRIAXone (ROCEPHIN)  IV  1 g Intravenous Q24H  . enoxaparin (LOVENOX) injection  40 mg Subcutaneous Q24H  . guaiFENesin  600 mg Oral BID  . insulin aspart  0-5 Units Subcutaneous QHS  . insulin aspart  0-9 Units Subcutaneous TID WC  . phosphorus  500 mg Oral QID  . propranolol ER  60 mg Oral Daily  . senna  1 tablet Oral BID    Continuous Infusions: . sodium chloride       LOS: 2 days    Annita Brod,  MD [facility] Pager [phone]  If 7PM-7AM, please contact night-coverage [email or web address] Password [email or web address] 06/29/2016, 2:40 PM

## 2016-06-30 LAB — CBC
HEMATOCRIT: 31.6 % — AB (ref 36.0–46.0)
HEMOGLOBIN: 10.4 g/dL — AB (ref 12.0–15.0)
MCH: 30.3 pg (ref 26.0–34.0)
MCHC: 32.9 g/dL (ref 30.0–36.0)
MCV: 92.1 fL (ref 78.0–100.0)
Platelets: 246 10*3/uL (ref 150–400)
RBC: 3.43 MIL/uL — ABNORMAL LOW (ref 3.87–5.11)
RDW: 15.4 % (ref 11.5–15.5)
WBC: 8 10*3/uL (ref 4.0–10.5)

## 2016-06-30 LAB — BASIC METABOLIC PANEL
ANION GAP: 10 (ref 5–15)
BUN: 21 mg/dL — AB (ref 6–20)
CALCIUM: 8.7 mg/dL — AB (ref 8.9–10.3)
CO2: 27 mmol/L (ref 22–32)
Chloride: 103 mmol/L (ref 101–111)
Creatinine, Ser: 0.71 mg/dL (ref 0.44–1.00)
GFR calc Af Amer: >60 mL/min (ref >60)
GLUCOSE: 97 mg/dL (ref 65–99)
Potassium: 3.2 mmol/L — ABNORMAL LOW (ref 3.5–5.1)
Sodium: 140 mmol/L (ref 135–145)

## 2016-06-30 LAB — MRSA PCR SCREENING: MRSA BY PCR: NEGATIVE

## 2016-06-30 LAB — GLUCOSE, CAPILLARY
GLUCOSE-CAPILLARY: 81 mg/dL (ref 65–99)
GLUCOSE-CAPILLARY: 91 mg/dL (ref 65–99)
Glucose-Capillary: 101 mg/dL — ABNORMAL HIGH (ref 65–99)
Glucose-Capillary: 88 mg/dL (ref 65–99)

## 2016-06-30 LAB — MAGNESIUM: MAGNESIUM: 2.2 mg/dL (ref 1.7–2.4)

## 2016-06-30 LAB — PHOSPHORUS: Phosphorus: 3.2 mg/dL (ref 2.5–4.6)

## 2016-06-30 NOTE — Progress Notes (Signed)
Patient using incentive spirometer, but has not produced any sputum as of yet.  Patient refused initial chest PT.  Patient instructed on the purpose, and was asked to try at least at around 3pm.  Patient agreed, respiratory called, but stated that they would be unable to perform until the next shift.

## 2016-06-30 NOTE — Progress Notes (Signed)
BSE completed, full report to follow.  No indications of airway compromise/dysphagia.  Luanna Salk, [place] [facility] SLP    [phone]

## 2016-06-30 NOTE — Progress Notes (Signed)
PROGRESS NOTE  Megan Ryan OZY:[id number] DOB: 01/29/1936 DOA: 06/27/2016 PCP: Philis Fendt, MD  Brief Summary: 81 year female with past medical history of obesity, diastolic heart failure and diabetes admitted on 1/1 after having one week of upper respiratory symptoms and came in with confusion and found to have acute sepsis secondary to pneumonia. Following admission, also found to have large urinary tract infection. Patient treated with IV fluids and antibiotics.  By 1/2, patient much improved. More alert and not requiring oxygen. Blood pressure stabilized. Today, patient continues to slowly improve. Had spiking fever overnight. Still feels weak. Echocardiogram done confirmed chronic diastolic heart failure. Pro calcitonin this morning slowly trending downward.  Subjective  last 24hrs:  tmax 100.4 (2pm on 1/3) Poor historian, sitting up in chair, on room air, NAD, wants to go home Family in room  Assessment/Plan: Active Problems:    Sepsis ([place]) secondary to community-acquired pneumonia and UTI. Patient met criteria on admission given leukocytosis, high fever, tachypnea, respiratory failure, tachycardia and mild acute kidney injury with source being pulmonary infiltrate as well as UTI.  Sepsis now resolved. Patient more stable. Continue IV antibiotics until white blood cell count normalizes. urine cultures with multiple species. Sputum sample pending collection.  Given that patient had markedly high temperatures on admission and still spiked fever despite initiation of antibiotics,  CT scan of abdomen and pelvis  Was done, no pyelonephritis but persistent left lower lobe lung infiltrate.   She was on rocephin and zithromax, antibiotics changed to Zosyn and vancomycin on 1/3 Start chest physiotherapy, hopefully able to collect sputum sample. Swallow eval pending, incentive spirometer. She will need to have follow up imaging to ensure resolution of pna after discharge.    Acute  respiratory failure with hypoxia ([place]): from pneumonia Initially oxygen saturations at 89% on admission on room air and with oxygen, up to 96%. By following day, able to maintain oxygen saturation greater than 95% on room air   acute encephalopathy: Secondary to sepsis: Improving  Hypophosphatemia: Replacing.    Hypokalemia: Replacing    Dehydration, received ivf, now legs start to have edema, /c ivf    Essential hypertension:  Holding antihypertensives initially due to soft blood pressures. consider d/c norvasc, changed to low dose lopressor due to tendency to have lower extremity edema Currently bp stable off bp meds    Diet conrolled Diabetes mellitus ([place]): a1c 6.2. Blood sugar stable.    Cardiomegaly: Noted, confirmed diastolic heart failure on echo. Dehydrated on admission, now has some edema lower    No blood products: Patient is a Jehovah's Witness  Intention tremor: More pronounced. Suspect worsening may be secondary to weakness and illness from sepsis. Observed this myself when she was trying to eat. We'll start propranolol. Noted thyroid level normal.  Code Status: DO NOT RESUSCITATE   Family Communication: Discussed with granddaughter and grandson at the bedside  Disposition Plan: Slowly improving. Anticipate discharge once white count normalizes and no further fevers.    Consultants:  None   Procedures:  None   Antimicrobials:  IV Rocephin and Zithromax 1/1-1/3  IV Zosyn and Vanco 1/3-present  DVT prophylaxis: Lovenox   Objective: Vitals:   06/29/16 1426 06/29/16 1441 06/29/16 2117 06/30/16 0507  BP: (!) 143/87  (!) 113/95 123/83  Pulse: 100  90 72  Resp: _0 Temp: (!) 100.5 F (38.1 C)  98.2 F (36.8 C) 98.5 F (36.9 C)  TempSrc: Oral  Oral Oral  SpO2: 99%  94% 98%  Weight:  101.2 kg (223 lb 1.7 oz)    Height:        Intake/Output Summary (Last 24 hours) at 06/30/16 1258 Last data filed at 06/30/16 0700  Gross per 24 hour    Intake          2041.25 ml  Output              200 ml  Net          1841.25 ml   Filed Weights   06/27/16 1714 06/29/16 1441  Weight: 101.2 kg (223 lb) 101.2 kg (223 lb 1.7 oz)    Exam:   General:  Alert and oriented 2, no acute distress   Cardiovascular: Regular rate and rhythm,    Respiratory: Decreased breath sounds right base   Abdomen: Soft, nontender, nontender; positive bowel sounds   Musculoskeletal: No clubbing or cyanosis, trace pitting edema   Skin: No skin breaks, tears or lesions  Psychiatry: Patient is appropriate, no evidence of psychoses   Neuro: Noted severe shaking of upper extremities when she tried to eat, especially at the end of action   Data Reviewed: CBC:  Recent Labs Lab 06/27/16 1723 06/28/16 0529 06/30/16 0608  WBC 16.2* 15.3* 8.0  NEUTROABS 14.3*  --   --   HGB 11.1* 10.3* 10.4*  HCT 34.1* 32.4* 31.6*  MCV 93.4 94.5 92.1  PLT 218 203 024   Basic Metabolic Panel:  Recent Labs Lab 06/27/16 1723 06/27/16 2225 06/28/16 0529 06/30/16 0608  NA 135  --  138 140  K 3.1*  --  3.9 3.2*  CL 99*  --  106 103  CO2 25  --  23 27  GLUCOSE 172*  --  92 97  BUN 23*  --  19 21*  CREATININE 1.16*  --  0.68 0.71  CALCIUM 8.7*  --  8.4* 8.7*  MG  --  1.9 2.1 2.2  PHOS  --   --  1.5* 3.2   GFR: Estimated Creatinine Clearance: 70.8 mL/min (by C-G formula based on SCr of 0.71 mg/dL). Liver Function Tests:  Recent Labs Lab 06/27/16 1723 06/28/16 0529  AST 30 34  ALT 28 26  ALKPHOS 71 63  BILITOT 1.1 0.6  PROT 8.4* 7.8  ALBUMIN 3.2* 2.8*   No results for input(s): LIPASE, AMYLASE in the last 168 hours. No results for input(s): AMMONIA in the last 168 hours. Coagulation Profile:  Recent Labs Lab 06/27/16 1723  INR 1.39   Cardiac Enzymes: No results for input(s): CKTOTAL, CKMB, CKMBINDEX, TROPONINI in the last 168 hours. BNP (last 3 results) No results for input(s): PROBNP in the last 8760 hours. HbA1C:  Recent Labs   06/28/16 0529  HGBA1C 6.2*   CBG:  Recent Labs Lab 06/29/16 1128 06/29/16 1700 06/29/16 2240 06/30/16 0742 06/30/16 1145  GLUCAP 83 121* 111* 101* 88   Lipid Profile: No results for input(s): CHOL, HDL, LDLCALC, TRIG, CHOLHDL, LDLDIRECT in the last 72 hours. Thyroid Function Tests:  Recent Labs  06/28/16 0529  TSH 1.111   Anemia Panel: No results for input(s): VITAMINB12, FOLATE, FERRITIN, TIBC, IRON, RETICCTPCT in the last 72 hours. Urine analysis:    Component Value Date/Time   COLORURINE YELLOW 06/28/2016 0753   APPEARANCEUR CLOUDY (A) 06/28/2016 0753   LABSPEC 1.019 06/28/2016 0753   PHURINE 5.0 06/28/2016 0753   GLUCOSEU NEGATIVE 06/28/2016 0753   HGBUR MODERATE (A) 06/28/2016 [place] NEGATIVE 06/28/2016 0753  [place] NEGATIVE 06/28/2016 0753   PROTEINUR 30 (A) 06/28/2016 0753   NITRITE NEGATIVE 06/28/2016 0753   LEUKOCYTESUR LARGE (A) 06/28/2016 0753   Sepsis Labs: _0 (procalcitonin:4,lacticidven:4)  ) Recent Results (from the past 240 hour(s))  Culture, blood (Routine x 2)     Status: None (Preliminary result)   Collection Time: 06/27/16  5:23 PM  Result Value Ref Range Status   Specimen Description RIGHT ANTECUBITAL  Final   Special Requests BOTTLES DRAWN AEROBIC AND ANAEROBIC 5CC  Final   Culture   Final    NO GROWTH 1 DAY Performed at [facility]    Report Status PENDING  Incomplete  Culture, blood (Routine x 2)     Status: None (Preliminary result)   Collection Time: 06/27/16  5:24 PM  Result Value Ref Range Status   Specimen Description BLOOD BLOOD LEFT FOREARM  Final   Special Requests BOTTLES DRAWN AEROBIC AND ANAEROBIC 5 CC  Final   Culture   Final    NO GROWTH 1 DAY Performed at [facility]    Report Status PENDING  Incomplete  Urine culture     Status: Abnormal   Collection Time: 06/28/16  7:53 AM  Result Value Ref Range Status   Specimen Description URINE, CLEAN CATCH  Final   Special Requests  NONE  Final   Culture MULTIPLE SPECIES PRESENT, SUGGEST RECOLLECTION (A)  Final   Report Status 06/29/2016 FINAL  Final      Studies: Ct Abdomen Pelvis W Wo Contrast  Result Date: 06/29/2016 CLINICAL DATA:  Pneumonia. Evaluate for obstructive uropathy or pyelonephritis. EXAM: CT ABDOMEN AND PELVIS WITHOUT AND WITH CONTRAST TECHNIQUE: Multidetector CT imaging of the abdomen and pelvis was performed following the standard protocol before and following the bolus administration of intravenous contrast. CONTRAST:  143m ISOVUE-300 IOPAMIDOL (ISOVUE-300) INJECTION 61% COMPARISON:  05/17/2016 FINDINGS: Lower chest: Moderate cardiac enlargement. Small pericardial effusion. Airspace consolidation involving the left lower lobe compatible with the clinical history of pneumonia. Hepatobiliary: No focal liver abnormality is seen. No gallstones, gallbladder wall thickening, or biliary dilatation. Pancreas: Unremarkable. No pancreatic ductal dilatation or surrounding inflammatory changes. Spleen: Normal in size without focal abnormality. Adrenals/Urinary Tract: Normal appearance of the right adrenal gland. Stable left adrenal nodule measuring 1.5 cm, image 28 of series 7. This was present back in 2008 and most likely represents a benign adenoma. No kidney stones in no evidence for hydronephrosis. Right kidney cyst measures 4.8 cm, image 37 of series 7. No evidence to suggest pyelonephritis. Urinary bladder appears normal. Stomach/Bowel: Stomach is within normal limits. Appendix appears normal. No evidence of bowel wall thickening, distention, or inflammatory changes. Vascular/Lymphatic: Aortic atherosclerosis. No enlarged abdominal or pelvic lymph nodes. Reproductive: Calcified uterine fibroids noted.  No adnexal mass. Other: Previous mesh repair of ventral abdominal wall hernia. No ascites or fluid collections. Musculoskeletal: No acute or significant osseous findings.Degenerative disc disease noted within the lumbar  spine. IMPRESSION: 1. No evidence for kidney stones or obstructive uropathy. There is no evidence for pyelonephritis. 2. Stable left adrenal nodule.  Likely benign adenoma 3. Small pericardial effusion. 4. Left lower lobe pneumonia Electronically Signed   By: TKerby MoorsM.D.   On: 06/29/2016 16:58    Scheduled Meds: . aspirin EC  81 mg Oral Daily  . enoxaparin (LOVENOX) injection  40 mg Subcutaneous Q24H  . guaiFENesin  600 mg Oral BID  . insulin aspart  0-5 Units Subcutaneous QHS  . insulin aspart  0-9 Units Subcutaneous TID WC  .  phosphorus  500 mg Oral QID  . piperacillin-tazobactam (ZOSYN)  IV  3.375 g Intravenous Q8H  . propranolol ER  60 mg Oral Daily  . senna  1 tablet Oral BID  . vancomycin  750 mg Intravenous Q12H    Continuous Infusions: . sodium chloride 75 mL/hr at 06/29/16 1447     LOS: 3 days   Time spent: 63mns   Donnie Gedeon, MD PhD [facility] Pager [phone] If 7PM-7AM, please contact night-coverage [email or web address] Password [email or web address] 06/30/2016, 12:58 PM

## 2016-06-30 NOTE — Evaluation (Signed)
Clinical/Bedside Swallow Evaluation Patient Details  Name: Megan Ryan MRN: [id number] Date of Birth: 31-May-1936  Today's Date: 06/30/2016 Time: SLP Start Time (ACUTE ONLY): 22 SLP Stop Time (ACUTE ONLY): 1251 SLP Time Calculation (min) (ACUTE ONLY): 11 min  Past Medical History:  Past Medical History:  Diagnosis Date  . Diabetes mellitus ([place]) 06/27/2016  . Diastolic HF (heart failure) ([place])   . Hypertension    Past Surgical History: History reviewed. No pertinent surgical history. HPI:  81 yo female adm to [facility] with confusion and respiratory symptoms = diagnosed with sepsis.  Pt with h/o obesity, CHF, tremor.  Found to have UTI, small pleural effusion and LLL pna per CT chest.  Pt denies problems swallowing.    Assessment / Plan / Recommendation Clinical Impression  Pt only observed consuming tea as she politely declined to consume solids stating she has just finished eating.  NO symptoms of airway compromise with po intake even with attempt at 3 ounce water test.  Pt with negative CN exam, strong voice and cough. Educated pt and family to diet recommendations and general precautions.  NO follow up needed.   Recommend continue regular/t    Aspiration Risk  Mild aspiration risk    Diet Recommendation Regular;Thin liquid   Liquid Administration via: Cup;Straw Medication Administration: Whole meds with liquid (large pills with puree if problematic) Supervision: Patient able to self feed Compensations: Slow rate;Small sips/bites Postural Changes: Seated upright at 90 degrees;Remain upright for at least 30 minutes after po intake    Other  Recommendations Oral Care Recommendations: Oral care BID   Follow up Recommendations None      Frequency and Duration   n/a         Prognosis        Swallow Study   General Date of Onset: 06/30/16 HPI: 81 yo female adm to [facility] with confusion and respiratory symptoms = diagnosed with sepsis.  Pt with h/o obesity, CHF, tremor.  Found to  have UTI, small pleural effusion and LLL pna per CT chest.   Type of Study: Bedside Swallow Evaluation Diet Prior to this Study: Regular;Thin liquids Temperature Spikes Noted: No History of Recent Intubation: No Behavior/Cognition: Alert;Cooperative;Pleasant mood Oral Cavity Assessment: Within Functional Limits Oral Care Completed by SLP: No Oral Cavity - Dentition: Other (Comment);Adequate natural dentition (has partials that she does not wear for po due to discomfort) Vision: Functional for self-feeding Self-Feeding Abilities: Able to feed self Patient Positioning: Upright in bed Baseline Vocal Quality: Normal Volitional Cough: Strong Volitional Swallow: Able to elicit    Oral/Motor/Sensory Function Overall Oral Motor/Sensory Function: Within functional limits   Ice Chips Ice chips: Not tested   Thin Liquid Thin Liquid: Within functional limits Presentation: Cup;Self Fed;Straw Other Comments: 3 ounce water test completed, pt did not drink in succession but no indications of deficits    Nectar Thick Nectar Thick Liquid: Not tested   Honey Thick Honey Thick Liquid: Not tested   Puree Puree: Not tested   Solid   GO   Solid: Not tested        Luanna Salk, [facility] SLP [phone]

## 2016-06-30 NOTE — Progress Notes (Signed)
RT came to see patient.  RT and RN explained importance of chest PT. Pt states that is would be too much for her tonight and refused.  RT and RN educated pt again on importance of chest PT. Pt states that she will just use her IS tonight and maybe try chest PT in am.  Family present at bedside. Encouraged IS use and TCDB exercises.  Pt reminded that sputum sample was needed for lab.  Pt states she is unable to give sample at this time.  Will cont to monitor pt status.

## 2016-07-01 ENCOUNTER — Ambulatory Visit (HOSPITAL_COMMUNITY): Payer: Medicare Other

## 2016-07-01 LAB — GLUCOSE, CAPILLARY
GLUCOSE-CAPILLARY: 124 mg/dL — AB (ref 65–99)
Glucose-Capillary: 119 mg/dL — ABNORMAL HIGH (ref 65–99)
Glucose-Capillary: 86 mg/dL (ref 65–99)
Glucose-Capillary: 108 mg/dL — ABNORMAL HIGH (ref 65–99)

## 2016-07-01 LAB — CBC WITH DIFFERENTIAL/PLATELET
BASOS PCT: 0 %
Basophils Absolute: 0 10*3/uL (ref 0.0–0.1)
Eosinophils Absolute: 0 10*3/uL (ref 0.0–0.7)
Eosinophils Relative: 1 %
HCT: 29.7 % — ABNORMAL LOW (ref 36.0–46.0)
Hemoglobin: 9.8 g/dL — ABNORMAL LOW (ref 12.0–15.0)
Lymphocytes Relative: 13 %
Lymphs Abs: 0.8 10*3/uL (ref 0.7–4.0)
MCH: 30.6 pg (ref 26.0–34.0)
MCHC: 33 g/dL (ref 30.0–36.0)
MCV: 92.8 fL (ref 78.0–100.0)
MONO ABS: 0.8 10*3/uL (ref 0.1–1.0)
MONOS PCT: 11 %
Neutro Abs: 5 10*3/uL (ref 1.7–7.7)
Neutrophils Relative %: 75 %
PLATELETS: 267 10*3/uL (ref 150–400)
RBC: 3.2 MIL/uL — ABNORMAL LOW (ref 3.87–5.11)
RDW: 15.4 % (ref 11.5–15.5)
WBC: 6.6 10*3/uL (ref 4.0–10.5)

## 2016-07-01 LAB — BASIC METABOLIC PANEL
Anion gap: 8 (ref 5–15)
BUN: 17 mg/dL (ref 6–20)
CALCIUM: 8.5 mg/dL — AB (ref 8.9–10.3)
CO2: 29 mmol/L (ref 22–32)
CREATININE: 0.71 mg/dL (ref 0.44–1.00)
Chloride: 104 mmol/L (ref 101–111)
GFR calc non Af Amer: >60 mL/min (ref >60)
GLUCOSE: 93 mg/dL (ref 65–99)
Potassium: 3.1 mmol/L — ABNORMAL LOW (ref 3.5–5.1)
Sodium: 141 mmol/L (ref 135–145)

## 2016-07-01 LAB — PROCALCITONIN: PROCALCITONIN: 1.25 ng/mL

## 2016-07-01 MED ORDER — AMOXICILLIN-POT CLAVULANATE 875-125 MG PO TABS
1.0000 | ORAL_TABLET | Freq: Two times a day (BID) | ORAL | Status: DC
  Administered 2016-07-01 – 2016-07-02 (×3): 1 via ORAL
  Filled 2016-07-01 (×4): qty 1

## 2016-07-01 MED ORDER — POTASSIUM CHLORIDE CRYS ER 20 MEQ PO TBCR
40.0000 meq | EXTENDED_RELEASE_TABLET | Freq: Once | ORAL | Status: AC
  Administered 2016-07-01: 40 meq via ORAL
  Filled 2016-07-01: qty 2

## 2016-07-01 NOTE — Discharge Instructions (Signed)
Amoxicillin; Clavulanic Acid tablets What is this medicine? AMOXICILLIN; CLAVULANIC ACID (a mox i SIL in; KLAV yoo lan ic AS id) is a penicillin antibiotic. It is used to treat certain kinds of bacterial infections. It will not work for colds, flu, or other viral infections. COMMON BRAND NAME(S): Augmentin What should I tell my health care provider before I take this medicine? They need to know if you have any of these conditions: -bowel disease, like colitis -kidney disease -liver disease -mononucleosis -an unusual or allergic reaction to amoxicillin, penicillin, cephalosporin, other antibiotics, clavulanic acid, other medicines, foods, dyes, or preservatives -pregnant or trying to get pregnant -breast-feeding How should I use this medicine? Take this medicine by mouth with a full glass of water. Follow the directions on the prescription label. Take at the start of a meal. Do not crush or chew. If the tablet has a score line, you may cut it in half at the score line for easier swallowing. Take your medicine at regular intervals. Do not take your medicine more often than directed. Take all of your medicine as directed even if you think you are better. Do not skip doses or stop your medicine early. Talk to your pediatrician regarding the use of this medicine in children. Special care may be needed. What if I miss a dose? If you miss a dose, take it as soon as you can. If it is almost time for your next dose, take only that dose. Do not take double or extra doses. What may interact with this medicine? -allopurinol -anticoagulants -birth control pills -methotrexate -probenecid What should I watch for while using this medicine? Tell your doctor or health care professional if your symptoms do not improve. Do not treat diarrhea with over the counter products. Contact your doctor if you have diarrhea that lasts more than 2 days or if it is severe and watery. If you have diabetes, you may get a  false-positive result for sugar in your urine. Check with your doctor or health care professional. Birth control pills may not work properly while you are taking this medicine. Talk to your doctor about using an extra method of birth control. What side effects may I notice from receiving this medicine? Side effects that you should report to your doctor or health care professional as soon as possible: -allergic reactions like skin rash, itching or hives, swelling of the face, lips, or tongue -breathing problems -dark urine -fever or chills, sore throat -redness, blistering, peeling or loosening of the skin, including inside the mouth -seizures -trouble passing urine or change in the amount of urine -unusual bleeding, bruising -unusually weak or tired -white patches or sores in the mouth or throat Side effects that usually do not require medical attention (report to your doctor or health care professional if they continue or are bothersome): -diarrhea -dizziness -headache -nausea, vomiting -stomach upset -vaginal or anal irritation Where should I keep my medicine? Keep out of the reach of children. Store at room temperature below 25 degrees C (77 degrees F). Keep container tightly closed. Throw away any unused medicine after the expiration date.  2017 Elsevier/Gold Standard (2007-09-06 12:04:30)

## 2016-07-01 NOTE — Care Management Important Message (Signed)
Important Message  Patient Details  Name: Megan Ryan MRN: [id number] Date of Birth: 07-Aug-1935   Medicare Important Message Given:  Yes    Kerin Salen 07/01/2016, 10:49 AMImportant Message  Patient Details  Name: Megan Ryan MRN: [id number] Date of Birth: May 02, 1936   Medicare Important Message Given:  Yes    Kerin Salen 07/01/2016, 10:49 AM

## 2016-07-01 NOTE — Progress Notes (Signed)
Pt refused chest vest.  MD aware.

## 2016-07-01 NOTE — Progress Notes (Signed)
PROGRESS NOTE  Megan Ryan PPI:[id number] DOB: [birth year] DOA: 06/27/2016 PCP: Philis Fendt, MD  Brief Summary: 81 year female with past medical history of obesity, diastolic heart failure and diabetes admitted on 1/1 after having one week of upper respiratory symptoms and came in with confusion and found to have acute sepsis secondary to pneumonia. Following admission, also found to have large urinary tract infection. Patient treated with IV fluids and antibiotics.  By 1/2, patient much improved. More alert and not requiring oxygen. Blood pressure stabilized. Today, patient continues to slowly improve. Had spiking fever overnight. Still feels weak. Echocardiogram done confirmed chronic diastolic heart failure. Pro calcitonin this morning slowly trending downward.  Subjective  last 24hrs:  Last fever 100.4 (2pm on 1/3) Poor historian, sitting up in chair, on room air, NAD, Megan Ryan has intermittent cough while eating, Megan Ryan states " some times the food go to the wrong pipe" Megan Ryan has lower extremity edema  Assessment/Plan: Active Problems:    Sepsis ([place]) secondary to community-acquired pneumonia and UTI. Patient met criteria on admission given leukocytosis, high fever, tachypnea, respiratory failure, tachycardia and mild acute kidney injury with source being pulmonary infiltrate as well as UTI.  Sepsis now resolved.   urine cultures with multiple species. Sputum sample pending collection.  Given that patient had markedly high temperatures on admission and still spiked fever despite initiation of antibiotics,  CT scan of abdomen and pelvis on 1/3 Was done, no pyelonephritis but persistent left lower lobe lung infiltrate.   Megan Ryan was on rocephin and zithromax, antibiotics changed to Zosyn and vancomycin on 1/3, fever resolved, mrsa negative, procaltitonin trending down, leukocytosis resolved , d/c iv abx, changed to augmentin to cover possible aspiration pna. Start chest physiotherapy, hopefully  able to collect sputum sample.  incentive spirometer. Megan Ryan will need to have follow up imaging to ensure resolution of pna after discharge.  Swallow eval:  Mild aspiration risk    Diet Recommendation Regular;Thin liquid   Liquid Administration via: Cup;Straw Medication Administration: Whole meds with liquid (large pills with puree if problematic) Supervision: Patient able to self feed Compensations: Slow rate;Small sips/bites Postural Changes: Seated upright at 90 degrees;Remain upright for at least 30 minutes after po intake        Acute respiratory failure with hypoxia ([place]): from pneumonia Initially oxygen saturations at 89% on admission on room air and with oxygen, up to 96%. By following day, able to maintain oxygen saturation greater than 95% on room air Ambulate.   acute encephalopathy: Secondary to sepsis: Improving  Hypophosphatemia: Replacing.    Hypokalemia: Replacing , mag 2.2    Dehydration, received ivf, now legs start to have edema, d/c ivf  Cardiomegaly: Noted, confirmed diastolic heart failure on echo. Dehydrated on admission, now has some edema lower, off ivf, getting venous doppler study, elevate legs    Essential hypertension:  Holding antihypertensives initially due to soft blood pressures. consider d/c norvasc, changed to low dose lopressor due to tendency to have lower extremity edema Currently bp stable off bp meds    Diet conrolled Diabetes mellitus ([place]): a1c 6.2. Blood sugar stable.       No blood products: Patient is a Sales promotion account executive Witness  Intention tremor: More pronounced. Suspect worsening may be secondary to weakness and illness from sepsis. Observed this myself when Megan Ryan was trying to eat. We'll start propranolol. Noted thyroid level normal.  Code Status: DO NOT RESUSCITATE   Family Communication: patient  Disposition Plan: d/c on 1/6 if continue to  be stable on oral abx.    Consultants:  None   Procedures:  None    Antimicrobials:  IV Rocephin and Zithromax 1/1-1/3  IV Zosyn and Vanco 1/3-1/5  augmentin from 1/5  DVT prophylaxis: Lovenox   Objective: Vitals:   06/30/16 1450 06/30/16 1600 06/30/16 2041 07/01/16 0419  BP: 124/70  133/74 (!) 119/58  Pulse: 66  66 63  Resp: 18  19 17   Temp: 98.1 F (36.7 C)  97.9 F (36.6 C) 98.4 F (36.9 C)  TempSrc: Oral  Oral Oral  SpO2: 97%  99% 98%  Weight:  105.4 kg (232 lb 6.4 oz)  105.5 kg (232 lb 9.4 oz)  Height:        Intake/Output Summary (Last 24 hours) at 07/01/16 1223 Last data filed at 07/01/16 3716  Gross per 24 hour  Intake             1033 ml  Output             1075 ml  Net              -42 ml   Filed Weights   06/29/16 1441 06/30/16 1600 07/01/16 0419  Weight: 101.2 kg (223 lb 1.7 oz) 105.4 kg (232 lb 6.4 oz) 105.5 kg (232 lb 9.4 oz)    Exam:   General:  Alert and oriented 2, no acute distress   Cardiovascular: Regular rate and rhythm,    Respiratory: + crackle left lung, otherwise , no wheezing, no rhonchi  Abdomen: Soft, nontender, nontender; positive bowel sounds   Musculoskeletal: No clubbing or cyanosis, trace pitting edema bilaterally  Skin: No skin breaks, tears or lesions  Psychiatry: Patient is appropriate, no evidence of psychoses   Neuro: Noted severe shaking of upper extremities when Megan Ryan tried to eat, especially at the end of action   Data Reviewed: CBC:  Recent Labs Lab 06/27/16 1723 06/28/16 0529 06/30/16 0608 07/01/16 0608  WBC 16.2* 15.3* 8.0 6.6  NEUTROABS 14.3*  --   --  5.0  HGB 11.1* 10.3* 10.4* 9.8*  HCT 34.1* 32.4* 31.6* 29.7*  MCV 93.4 94.5 92.1 92.8  PLT 218 203 246 967   Basic Metabolic Panel:  Recent Labs Lab 06/27/16 1723 06/27/16 2225 06/28/16 0529 06/30/16 0608 07/01/16 0608  NA 135  --  138 140 141  K 3.1*  --  3.9 3.2* 3.1*  CL 99*  --  106 103 104  CO2 25  --  23 27 29   GLUCOSE 172*  --  92 97 93  BUN 23*  --  19 21* 17  CREATININE 1.16*  --  0.68  0.71 0.71  CALCIUM 8.7*  --  8.4* 8.7* 8.5*  MG  --  1.9 2.1 2.2  --   PHOS  --   --  1.5* 3.2  --    GFR: Estimated Creatinine Clearance: 72.5 mL/min (by C-G formula based on SCr of 0.71 mg/dL). Liver Function Tests:  Recent Labs Lab 06/27/16 1723 06/28/16 0529  AST 30 34  ALT 28 26  ALKPHOS 71 63  BILITOT 1.1 0.6  PROT 8.4* 7.8  ALBUMIN 3.2* 2.8*   No results for input(s): LIPASE, AMYLASE in the last 168 hours. No results for input(s): AMMONIA in the last 168 hours. Coagulation Profile:  Recent Labs Lab 06/27/16 1723  INR 1.39   Cardiac Enzymes: No results for input(s): CKTOTAL, CKMB, CKMBINDEX, TROPONINI in the last 168 hours. BNP (last 3 results) No results for  input(s): PROBNP in the last 8760 hours. HbA1C: No results for input(s): HGBA1C in the last 72 hours. CBG:  Recent Labs Lab 06/30/16 1145 06/30/16 1725 06/30/16 2148 07/01/16 0752 07/01/16 1135  GLUCAP 88 81 91 119* 86   Lipid Profile: No results for input(s): CHOL, HDL, LDLCALC, TRIG, CHOLHDL, LDLDIRECT in the last 72 hours. Thyroid Function Tests: No results for input(s): TSH, T4TOTAL, FREET4, T3FREE, THYROIDAB in the last 72 hours. Anemia Panel: No results for input(s): VITAMINB12, FOLATE, FERRITIN, TIBC, IRON, RETICCTPCT in the last 72 hours. Urine analysis:    Component Value Date/Time   COLORURINE YELLOW 06/28/2016 0753   APPEARANCEUR CLOUDY (A) 06/28/2016 0753   LABSPEC 1.019 06/28/2016 0753   PHURINE 5.0 06/28/2016 0753   GLUCOSEU NEGATIVE 06/28/2016 0753   HGBUR MODERATE (A) 06/28/2016 0753   BILIRUBINUR NEGATIVE 06/28/2016 0753   KETONESUR NEGATIVE 06/28/2016 0753   PROTEINUR 30 (A) 06/28/2016 0753   NITRITE NEGATIVE 06/28/2016 0753   LEUKOCYTESUR LARGE (A) 06/28/2016 0753   Sepsis Labs: @LABRCNTIP (procalcitonin:4,lacticidven:4)  ) Recent Results (from the past 240 hour(s))  Culture, blood (Routine x 2)     Status: None (Preliminary result)   Collection Time: 06/27/16  5:23  PM  Result Value Ref Range Status   Specimen Description RIGHT ANTECUBITAL  Final   Special Requests BOTTLES DRAWN AEROBIC AND ANAEROBIC 5CC  Final   Culture   Final    NO GROWTH 2 DAYS Performed at [facility]    Report Status PENDING  Incomplete  Culture, blood (Routine x 2)     Status: None (Preliminary result)   Collection Time: 06/27/16  5:24 PM  Result Value Ref Range Status   Specimen Description BLOOD BLOOD LEFT FOREARM  Final   Special Requests BOTTLES DRAWN AEROBIC AND ANAEROBIC 5 CC  Final   Culture   Final    NO GROWTH 2 DAYS Performed at [facility]    Report Status PENDING  Incomplete  Urine culture     Status: Abnormal   Collection Time: 06/28/16  7:53 AM  Result Value Ref Range Status   Specimen Description URINE, CLEAN CATCH  Final   Special Requests NONE  Final   Culture MULTIPLE SPECIES PRESENT, SUGGEST RECOLLECTION (A)  Final   Report Status 06/29/2016 FINAL  Final  MRSA PCR Screening     Status: None   Collection Time: 06/30/16  1:46 PM  Result Value Ref Range Status   MRSA by PCR NEGATIVE NEGATIVE Final    Comment:        The GeneXpert MRSA Assay (FDA approved for NASAL specimens only), is one component of a comprehensive MRSA colonization surveillance program. It is not intended to diagnose MRSA infection nor to guide or monitor treatment for MRSA infections.       Studies: No results found.  Scheduled Meds: . amoxicillin-clavulanate  1 tablet Oral Q12H  . aspirin EC  81 mg Oral Daily  . enoxaparin (LOVENOX) injection  40 mg Subcutaneous Q24H  . guaiFENesin  600 mg Oral BID  . insulin aspart  0-5 Units Subcutaneous QHS  . insulin aspart  0-9 Units Subcutaneous TID WC  . propranolol ER  60 mg Oral Daily  . senna  1 tablet Oral BID    Continuous Infusions:    LOS: 4 days   Time spent: 25mns   Kashira Behunin, MD PhD [facility] Pager [phone] If 7PM-7AM, please contact  night-coverage [email or web address] Password [email or web address]1/10/2016, 12:23 PM

## 2016-07-01 NOTE — Progress Notes (Signed)
Patient ambulated in room.  Patient adamantly refused to ambulate in halls, stating that she was not dressed to be out in public.  Patient's oxygen saturation was 93 to 97 percent on room air, while ambulating.

## 2016-07-01 NOTE — Progress Notes (Signed)
Pharmacy Antibiotic Note  Megan Ryan is a 81 y.o. female admitted on 06/27/2016 with sepsis 2/2 CAP and UTI.  Pt started on Ceftriaxone and azithromycin initially but given markedly high temps on admission and continued fevers while on antibiotics, MD broadening therapy 1/3.  Pharmacy has been consulted for Vancomycin and Zosyn dosing.  Today, 07/01/2016 Day # 4 antibiotics, Day #2 current antibiotics  No fevers  WBC WNL  SCr WNL  PCT has improved 5.3 to 4.24 to 1.25  On Room air  Plan:  Suggest stopping vancomycin based on clinical improvement and no indication of resistant Gram positive infection.    If appropriate, stop vancomycin and zosyn and start IV ceftriaxone or PO cefpodoxime 200mg  BID.   Vancomycin 750 mg IV q12h remains appropriately dosed  If continued, will need to consider trough this weekend  Zosyn 3.375g IV q8h (4 hour infusion time) remains appropriately dosed   Height: 5\' 7"  (170.2 cm) Weight: 232 lb 9.4 oz (105.5 kg) IBW/kg (Calculated) : 61.6  Temp (24hrs), Avg:98.1 F (36.7 C), Min:97.9 F (36.6 C), Max:98.4 F (36.9 C)   Recent Labs Lab 06/27/16 1723 06/27/16 1732 06/27/16 2033 06/28/16 0529 06/30/16 0608 07/01/16 0608  WBC 16.2*  --   --  15.3* 8.0 6.6  CREATININE 1.16*  --   --  0.68 0.71 0.71  LATICACIDVEN  --  1.69 1.36  --   --   --     Estimated Creatinine Clearance: 72.5 mL/min (by C-G formula based on SCr of 0.71 mg/dL).    No Known Allergies  Antimicrobials this admission: 1/1 azithromycin >> 1/3 1/1 ceftriaxone >> 1/3 1/3 vancomycin >> 1/3 zosyn >>  Dose adjustments this admission: -  Microbiology results: 1/1 BCx: ngtd 1/1 UCx: mult species  1/2 strept pneumo ur ag: neg 1/2 influenza neg MRSA PCR: neg  Thank you for allowing pharmacy to be a part of this patient's care.  Doreene Eland, PharmD, BCPS.   Pager: [phone] 07/01/2016 11:03 AM

## 2016-07-01 NOTE — Progress Notes (Signed)
Spoke with patient at bedside about d/c. Patient states she lives alone, manages her own meals and medication, still drives but only limited. Has a sister in town that checks on her daily, sometimes assists as needed. She has 2 children that live in [place], [place]. She asked questions about [employer] that is handicapped accessible. CSW referral made. Also discussed PCS and ALF but she did not think she needed that. Declines need for [facility] services, has walker she uses at home. Will continue to follow.

## 2016-07-01 NOTE — Progress Notes (Signed)
Pt refused chest vest this am.  I explained vest and why she should use it.  Pt said that she may try it later in the day.

## 2016-07-02 ENCOUNTER — Inpatient Hospital Stay (HOSPITAL_COMMUNITY): Payer: Medicare Other

## 2016-07-02 DIAGNOSIS — R609 Edema, unspecified: Secondary | ICD-10-CM

## 2016-07-02 LAB — CBC
HEMATOCRIT: 29.9 % — AB (ref 36.0–46.0)
Hemoglobin: 9.8 g/dL — ABNORMAL LOW (ref 12.0–15.0)
MCH: 30.7 pg (ref 26.0–34.0)
MCHC: 32.8 g/dL (ref 30.0–36.0)
MCV: 93.7 fL (ref 78.0–100.0)
Platelets: 307 10*3/uL (ref 150–400)
RBC: 3.19 MIL/uL — ABNORMAL LOW (ref 3.87–5.11)
RDW: 15.4 % (ref 11.5–15.5)
WBC: 5.1 10*3/uL (ref 4.0–10.5)

## 2016-07-02 LAB — GLUCOSE, CAPILLARY
Glucose-Capillary: 64 mg/dL — ABNORMAL LOW (ref 65–99)
Glucose-Capillary: 87 mg/dL (ref 65–99)
Glucose-Capillary: 94 mg/dL (ref 65–99)

## 2016-07-02 LAB — BASIC METABOLIC PANEL
Anion gap: 8 (ref 5–15)
BUN: 17 mg/dL (ref 6–20)
CHLORIDE: 105 mmol/L (ref 101–111)
CO2: 27 mmol/L (ref 22–32)
Calcium: 8.5 mg/dL — ABNORMAL LOW (ref 8.9–10.3)
Creatinine, Ser: 0.55 mg/dL (ref 0.44–1.00)
GFR calc Af Amer: >60 mL/min (ref >60)
Glucose, Bld: 88 mg/dL (ref 65–99)
POTASSIUM: 3.7 mmol/L (ref 3.5–5.1)
Sodium: 140 mmol/L (ref 135–145)

## 2016-07-02 MED ORDER — POLYETHYLENE GLYCOL 3350 17 G PO PACK
17.0000 g | PACK | Freq: Every day | ORAL | Status: DC
  Filled 2016-07-02: qty 1

## 2016-07-02 MED ORDER — FUROSEMIDE 10 MG/ML IJ SOLN
40.0000 mg | Freq: Once | INTRAMUSCULAR | Status: AC
Start: 2016-07-02 — End: 2016-07-02
  Administered 2016-07-02: 40 mg via INTRAVENOUS
  Filled 2016-07-02: qty 4

## 2016-07-02 MED ORDER — GUAIFENESIN ER 600 MG PO TB12: 600.0000 mg | ORAL_TABLET | Freq: Two times a day (BID) | ORAL | 0 refills | Status: DC

## 2016-07-02 MED ORDER — PROPRANOLOL HCL ER 60 MG PO CP24: 60.0000 mg | ORAL_CAPSULE | Freq: Every day | ORAL | 0 refills | Status: DC

## 2016-07-02 MED ORDER — ALBUTEROL SULFATE HFA 108 (90 BASE) MCG/ACT IN AERS: 1.0000 | INHALATION_SPRAY | Freq: Four times a day (QID) | RESPIRATORY_TRACT | 0 refills | Status: DC | PRN

## 2016-07-02 MED ORDER — FAMOTIDINE 20 MG PO TABS: 20.0000 mg | ORAL_TABLET | Freq: Every day | ORAL | 0 refills | Status: DC

## 2016-07-02 MED ORDER — MAGNESIUM OXIDE 400 MG PO TABS: 400.0000 mg | ORAL_TABLET | Freq: Every day | ORAL | 0 refills | Status: DC

## 2016-07-02 MED ORDER — POLYETHYLENE GLYCOL 3350 17 G PO PACK: 17.0000 g | PACK | Freq: Every day | ORAL | 0 refills | Status: DC | PRN

## 2016-07-02 MED ORDER — FUROSEMIDE 40 MG PO TABS: 40.0000 mg | ORAL_TABLET | Freq: Every day | ORAL | 0 refills | Status: DC | PRN

## 2016-07-02 MED ORDER — AMOXICILLIN-POT CLAVULANATE 875-125 MG PO TABS
1.0000 | ORAL_TABLET | Freq: Two times a day (BID) | ORAL | 0 refills | Status: AC
Start: 2016-07-02 — End: 2016-07-04

## 2016-07-02 MED ORDER — POTASSIUM CHLORIDE CRYS ER 20 MEQ PO TBCR: 20.0000 meq | EXTENDED_RELEASE_TABLET | ORAL | 0 refills | Status: DC

## 2016-07-02 NOTE — Progress Notes (Signed)
*  Preliminary Results* Bilateral lower extremity venous duplex completed. Bilateral lower extremities are negative for deep vein thrombosis. There is no evidence of Baker's cyst bilaterally.  07/02/2016 9:46 AM Maudry Mayhew, BS, RVT, RDCS, RDMS

## 2016-07-02 NOTE — Discharge Summary (Signed)
Discharge Summary  Megan Ryan BWL:[id number] DOB: Sep 23, 1935  PCP: Megan Fendt, MD  Admit date: 06/27/2016 Discharge date: 07/02/2016  Time spent: >49mns, more than 50% time spent on coordination of care and patient education.  Recommendations for Outpatient Follow-up:  1. F/u with PMD within a week  for hospital discharge follow up, repeat cbc/bmp at follow up. pmd to repeat cxr in 4 weeks to ensure resolution of pneumonia, pmd to continue adjust lasix dose and blood pressure meds  Discharge Diagnoses:  Active Hospital Problems   Diagnosis Date Noted  . Intention tremor 06/29/2016  . Chronic diastolic heart failure ([place] 06/29/2016  . Hypophosphatemia 06/29/2016  . Urinary tract infection 06/29/2016  . Sepsis ([place] 06/27/2016  . Acute respiratory failure with hypoxia ([place] 06/27/2016  . CAP (community acquired pneumonia) 06/27/2016  . Hypokalemia 06/27/2016  . Dehydration 06/27/2016  . Essential hypertension 06/27/2016  . Diabetes mellitus ([place] 06/27/2016  . Cardiomegaly 06/27/2016  . No blood products 06/27/2016    Resolved Hospital Problems   Diagnosis Date Noted Date Resolved  No resolved problems to display.    Discharge Condition: stable  Diet recommendation: heart healthy/carb modified  Filed Weights   06/30/16 1600 07/01/16 0419 07/02/16 0653  Weight: 105.4 kg (232 lb 6.4 oz) 105.5 kg (232 lb 9.4 oz) 105.9 kg (233 lb 6.4 oz)    History of present illness:  (per admitting MD Dr DRoel Cluck  PCP: Megan Fendt MD   Outpatient Specialists: none   Patient coming from:  home Lives alone,        Chief Complaint: Fever chills cough  HPI: Megan GERARDis a 81y.o. female with medical history significant of HTN, DM 2, asthma, CHF    Presented with one-week history of URI symptoms with cough generalized fatigue decreased by mouth intake, nasal congestion. in the past 24 hours her symptoms have worsened and she developed a fever at home. Patient  called EMS nothing seems to make symptoms better. Patient denies history of asthma but she is taking albuterol when necessary. Have not been wheezing. Reports minimal shortness of breath with any no sick contacts.   Regarding pertinent Chronic problems: Patient reports diet-controlled prediabetes. Denies history of heart failure or coronary artery disease  which she has arthritis and has troubles walking secondary to pain in her knees. Examination is Jehovah's Witness and refuses any blood products   IN ER:  Temp (24hrs), Avg:103.9 F (39.9 C), Min:102.3 F (39.1 C), Max:105.4 F (40.8 C)   Oxygen saturation down to 89% on room air currently on 2 L satting 94% RR 19 HR 98 Bp 105/55  Lactic acid 1.69  K 3.1Cr 1.1.6 up from baseline of 0.63  WBC 16.2  Hg 11.1 CXR showing cardiomegaly  And Left lower lobe atelectasis vs infiltrate  Following Medications were ordered in ER: Medications  sodium chloride 0.9 % bolus 1,000 mL (1,000 mLs Intravenous New Bag/Given 06/27/16 1828)    And  sodium chloride 0.9 % bolus 1,000 mL (1,000 mLs Intravenous New Bag/Given 06/27/16 1828)    And  sodium chloride 0.9 % bolus 1,000 mL (not administered)    And  sodium chloride 0.9 % bolus 500 mL (not administered)  cefTRIAXone (ROCEPHIN) 1 g in dextrose 5 % 50 mL IVPB (0 g Intravenous Stopped 06/27/16 1807)  azithromycin (ZITHROMAX) 500 mg in dextrose 5 % 250 mL IVPB (500 mg Intravenous New Bag/Given 06/27/16 1737)  acetaminophen (TYLENOL) tablet 1,000 mg (1,000 mg Oral Given 06/27/16  1737)  ipratropium-albuterol (DUONEB) 0.5-2.5 (3) MG/3ML nebulizer solution 3 mL (3 mLs Nebulization Given 06/27/16 1741)      Hospitalist was called for admission for Sepsis secondary to community-acquired pneumonia associated with acute r t respiratory failure with hypoxia    Hospital Course:  Active Problems:   Sepsis ([name])   Acute respiratory failure with hypoxia ([name])   CAP (community acquired pneumonia)    Hypokalemia   Dehydration   Essential hypertension   Diabetes mellitus ([name])   Cardiomegaly   No blood products   Intention tremor   Chronic diastolic heart failure ([name])   Hypophosphatemia   Urinary tract infection   Sepsis ([name]) secondary to community-acquired pneumonia /lobar pneumoniaand UTI. Patient met criteria on admission given leukocytosis, high fever, tachypnea, respiratory failure, tachycardia and mild acute kidney injury with source being pulmonary infiltrate as well as UTI.  Sepsis now resolved.   urine cultures with multiple species. Sputum sample not collected.  Blood culture no growth. mrsa screening negative.  Given that patient had markedly high temperatures on admission and still spiked fever despite initiation of antibiotics,  CT scan of abdomen and pelvis on 1/3 Was done, no pyelonephritis but persistent left lower lobe lung infiltrate.    She was on rocephin and zithromax, antibiotics changed to Zosyn and vancomycin on 1/3, fever resolved, mrsa negative, procaltitonin trending down, leukocytosis resolved , d/c iv abx, changed to augmentin to cover possible aspiration pna. ( patient report" some time the food goes into the wrong pipe") Patient refused chest physiotherapy, but agreed to  incentive spirometer. She will need to have follow up imaging to ensure resolution of pna after discharge.  Swallow eval:  Mild aspiration risk   Diet Recommendation Regular;Thin liquid  Liquid Administration via: Cup;Straw Medication Administration: Whole meds with liquid (large pills with puree if problematic) Supervision: Patient able to self feed Compensations: Slow rate;Small sips/bites Postural Changes: Seated upright at 90 degrees;Remain upright for at least 30 minutes after po intake       Acute respiratory failure with hypoxia ([name]): from lobar pneumonia Initially oxygen saturations at 89% on admission on room air and with oxygen, up to 96%.  She is weaned off  oxygen, able to maintain oxygen saturation greater than 95% on room air Ambulate.   acute encephalopathy: Secondary to sepsis: resolved, back to baseline.  Hypophosphatemia: Replaced    Hypokalemia: Replaced , mag 2.2    Cardiomegaly: Noted, confirmed diastolic heart failure on echo. Dehydrated on admission, now has some edema lower, off ivf, venous doppler study negative for DVT, elevate legs. Iv lasix given prior to discharge. She is discharged on oral lasix 93m daily prn for edema. She is also given oral potassium and magnesium supplement prescription at discharge.    Essential hypertension:  Home antihypertensives norvasc held due to soft blood pressures/sepsis.   norvasc discontinued at discharge due to tendency to have lower extremity edema She is discharged on propranolol and daily prn lasix    Diet conrolled Diabetes mellitus ([place]: a1c 6.2. Blood sugar stable.       No blood products: Patient is a JSales promotion account executiveWitness  Intention tremor: More pronounced. Suspect worsening may be secondary to weakness and illness from sepsis. She is started on  propranolol. No tremor at discharge.  Noted thyroid level normal.  Chronic constipation: stool softener.   Code Status: DO NOT RESUSCITATE   Family Communication: patient and great granddaughter  Disposition Plan: d/c on 1/6 .   Per case manager "Patient  states she lives alone, manages her own meals and medication, still drives but only limited. Has a sister in town that checks on her daily, sometimes assists as needed. She has 2 children that live in [place], [place]. She asked questions about [employer] that is handicapped accessible. CSW referral made. Also discussed PCS and ALF but she did not think she needed that. Declines need for [facility] services, has walker she uses at home"   Consultants:  None   Procedures:  None   Antimicrobials:  IV Rocephin and Zithromax 1/1-1/3  IV Zosyn and Vanco  1/3-1/5  augmentin from 1/5   DVT prophylaxis while in the hospital: Lovenox   Discharge Exam: BP 139/72 (BP Location: Left Arm)   Pulse 63   Temp 98.3 F (36.8 C) (Oral)   Resp 18   Ht 5' 7"  (1.702 m)   Wt 105.9 kg (233 lb 6.4 oz)   SpO2 99%   BMI 36.56 kg/m   General: NAD, aaox3 Cardiovascular: RRR Respiratory: left lung crackles has improved, not not completely resolved at discharge, right lung clear. Extremity: bilateral pitting edema  Discharge Instructions You were cared for by a hospitalist during your hospital stay. If you have any questions about your discharge medications or the care you received while you were in the hospital after you are discharged, you can call the unit and asked to speak with the hospitalist on call if the hospitalist that took care of you is not available. Once you are discharged, your primary care physician will handle any further medical issues. Please note that NO REFILLS for any discharge medications will be authorized once you are discharged, as it is imperative that you return to your primary care physician (or establish a relationship with a primary care physician if you do not have one) for your aftercare needs so that they can reassess your need for medications and monitor your lab values.   Allergies as of 07/02/2016   No Known Allergies     Medication List    STOP taking these medications   amLODipine 5 MG tablet Commonly known as:  NORVASC   oxyCODONE-acetaminophen 5-325 MG tablet Commonly known as:  ROXICET     TAKE these medications   albuterol 108 (90 Base) MCG/ACT inhaler Commonly known as:  PROVENTIL HFA;VENTOLIN HFA Inhale 1-2 puffs into the lungs every 6 (six) hours as needed for wheezing or shortness of breath.   amoxicillin-clavulanate 875-125 MG tablet Commonly known as:  AUGMENTIN Take 1 tablet by mouth every 12 (twelve) hours.   aspirin EC 81 MG tablet Take 81 mg by mouth daily.   famotidine 20 MG  tablet Commonly known as:  PEPCID Take 1 tablet (20 mg total) by mouth at bedtime. What changed:  when to take this   furosemide 40 MG tablet Commonly known as:  LASIX Take 1 tablet (40 mg total) by mouth daily as needed for fluid or edema.   guaiFENesin 600 MG 12 hr tablet Commonly known as:  MUCINEX Take 1 tablet (600 mg total) by mouth 2 (two) times daily.   LINZESS 72 MCG capsule Generic drug:  linaclotide Take 72 mcg by mouth daily before breakfast.   magnesium oxide 400 MG tablet Commonly known as:  MAG-OX Take 1 tablet (400 mg total) by mouth daily.   ondansetron 4 MG disintegrating tablet Commonly known as:  ZOFRAN ODT Take 1 tablet (4 mg total) by mouth every 4 (four) hours as needed for nausea or vomiting.  polyethylene glycol packet Commonly known as:  MIRALAX / GLYCOLAX Take 17 g by mouth daily as needed for mild constipation.   potassium chloride SA 20 MEQ tablet Commonly known as:  K-DUR,KLOR-CON Take 1 tablet (20 mEq total) by mouth every Monday, Wednesday, and Friday. Start taking on:  07/04/2016   propranolol ER 60 MG 24 hr capsule Commonly known as:  INDERAL LA Take 1 capsule (60 mg total) by mouth daily. Start taking on:  07/03/2016      No Known Allergies Follow-up Information    Megan Fendt, MD Follow up in 1 week(s).   Specialty:  Internal Medicine Why:  hospital discharge follow up, repeat cbc/bmp at follow up, pmd to continue adjust lasix and blood pressure medications. pmd to repeat cxr in 4weeks to ensure resolution of pneumonia Contact information: [address] [place] [address] [phone]            The results of significant diagnostics from this hospitalization (including imaging, microbiology, ancillary and laboratory) are listed below for reference.    Significant Diagnostic Studies: Ct Abdomen Pelvis W Wo Contrast  Result Date: 06/29/2016 CLINICAL DATA:  Pneumonia. Evaluate for obstructive uropathy or  pyelonephritis. EXAM: CT ABDOMEN AND PELVIS WITHOUT AND WITH CONTRAST TECHNIQUE: Multidetector CT imaging of the abdomen and pelvis was performed following the standard protocol before and following the bolus administration of intravenous contrast. CONTRAST:  192m ISOVUE-300 IOPAMIDOL (ISOVUE-300) INJECTION 61% COMPARISON:  05/17/2016 FINDINGS: Lower chest: Moderate cardiac enlargement. Small pericardial effusion. Airspace consolidation involving the left lower lobe compatible with the clinical history of pneumonia. Hepatobiliary: No focal liver abnormality is seen. No gallstones, gallbladder wall thickening, or biliary dilatation. Pancreas: Unremarkable. No pancreatic ductal dilatation or surrounding inflammatory changes. Spleen: Normal in size without focal abnormality. Adrenals/Urinary Tract: Normal appearance of the right adrenal gland. Stable left adrenal nodule measuring 1.5 cm, image 28 of series 7. This was present back in 2008 and most likely represents a benign adenoma. No kidney stones in no evidence for hydronephrosis. Right kidney cyst measures 4.8 cm, image 37 of series 7. No evidence to suggest pyelonephritis. Urinary bladder appears normal. Stomach/Bowel: Stomach is within normal limits. Appendix appears normal. No evidence of bowel wall thickening, distention, or inflammatory changes. Vascular/Lymphatic: Aortic atherosclerosis. No enlarged abdominal or pelvic lymph nodes. Reproductive: Calcified uterine fibroids noted.  No adnexal mass. Other: Previous mesh repair of ventral abdominal wall hernia. No ascites or fluid collections. Musculoskeletal: No acute or significant osseous findings.Degenerative disc disease noted within the lumbar spine. IMPRESSION: 1. No evidence for kidney stones or obstructive uropathy. There is no evidence for pyelonephritis. 2. Stable left adrenal nodule.  Likely benign adenoma 3. Small pericardial effusion. 4. Left lower lobe pneumonia Electronically Signed   By: TKerby MoorsM.D.   On: 06/29/2016 16:58   Dg Chest Port 1 View  Result Date: 06/27/2016 CLINICAL DATA:  Weakness, productive cough EXAM: PORTABLE CHEST 1 VIEW COMPARISON:  10/10/2015 FINDINGS: Cardiac silhouette is enlarged. Small LEFT effusion. LEFT basilar atelectasis versus infiltrate. No pneumothorax. IMPRESSION: LEFT lower lobe atelectasis versus infiltrate.  Small LEFT effusion. Cardiomegaly Electronically Signed   By: SSuzy BouchardM.D.   On: 06/27/2016 17:34    Microbiology: Recent Results (from the past 240 hour(s))  Culture, blood (Routine x 2)     Status: None (Preliminary result)   Collection Time: 06/27/16  5:23 PM  Result Value Ref Range Status   Specimen Description RIGHT ANTECUBITAL  Final   Special Requests BOTTLES  DRAWN AEROBIC AND ANAEROBIC 5CC  Final   Culture   Final    NO GROWTH 3 DAYS Performed at [facility]    Report Status PENDING  Incomplete  Culture, blood (Routine x 2)     Status: None (Preliminary result)   Collection Time: 06/27/16  5:24 PM  Result Value Ref Range Status   Specimen Description BLOOD BLOOD LEFT FOREARM  Final   Special Requests BOTTLES DRAWN AEROBIC AND ANAEROBIC 5 CC  Final   Culture   Final    NO GROWTH 3 DAYS Performed at [facility]    Report Status PENDING  Incomplete  Urine culture     Status: Abnormal   Collection Time: 06/28/16  7:53 AM  Result Value Ref Range Status   Specimen Description URINE, CLEAN CATCH  Final   Special Requests NONE  Final   Culture MULTIPLE SPECIES PRESENT, SUGGEST RECOLLECTION (A)  Final   Report Status 06/29/2016 FINAL  Final  MRSA PCR Screening     Status: None   Collection Time: 06/30/16  1:46 PM  Result Value Ref Range Status   MRSA by PCR NEGATIVE NEGATIVE Final    Comment:        The GeneXpert MRSA Assay (FDA approved for NASAL specimens only), is one component of a comprehensive MRSA colonization surveillance program. It is not intended to diagnose MRSA infection nor to  guide or monitor treatment for MRSA infections.      Labs: Basic Metabolic Panel:  Recent Labs Lab 06/27/16 1723 06/27/16 2225 06/28/16 0529 06/30/16 5631 07/01/16 0608 07/02/16 0615  NA 135  --  138 140 141 140  K 3.1*  --  3.9 3.2* 3.1* 3.7  CL 99*  --  106 103 104 105  CO2 25  --  23 27 29 27   GLUCOSE 172*  --  92 97 93 88  BUN 23*  --  19 21* 17 17  CREATININE 1.16*  --  0.68 0.71 0.71 0.55  CALCIUM 8.7*  --  8.4* 8.7* 8.5* 8.5*  MG  --  1.9 2.1 2.2  --   --   PHOS  --   --  1.5* 3.2  --   --    Liver Function Tests:  Recent Labs Lab 06/27/16 1723 06/28/16 0529  AST 30 34  ALT 28 26  ALKPHOS 71 63  BILITOT 1.1 0.6  PROT 8.4* 7.8  ALBUMIN 3.2* 2.8*   No results for input(s): LIPASE, AMYLASE in the last 168 hours. No results for input(s): AMMONIA in the last 168 hours. CBC:  Recent Labs Lab 06/27/16 1723 06/28/16 0529 06/30/16 0608 07/01/16 0608 07/02/16 0615  WBC 16.2* 15.3* 8.0 6.6 5.1  NEUTROABS 14.3*  --   --  5.0  --   HGB 11.1* 10.3* 10.4* 9.8* 9.8*  HCT 34.1* 32.4* 31.6* 29.7* 29.9*  MCV 93.4 94.5 92.1 92.8 93.7  PLT 218 203 246 267 307   Cardiac Enzymes: No results for input(s): CKTOTAL, CKMB, CKMBINDEX, TROPONINI in the last 168 hours. BNP: BNP (last 3 results)  Recent Labs  06/28/16 1536  BNP 141.0*    ProBNP (last 3 results) No results for input(s): PROBNP in the last 8760 hours.  CBG:  Recent Labs Lab 07/01/16 1135 07/01/16 1705 07/01/16 2115 07/02/16 0751 07/02/16 0821  GLUCAP 86 108* 124* 64* 87       Signed:  Solstice Lastinger MD, PhD  [facility] 07/02/2016, 11:27 AM

## 2016-07-02 NOTE — Evaluation (Signed)
Physical Therapy Evaluation Patient Details Name: Megan Ryan MRN: [id number] DOB: [birth year] Today's Date: 07/02/2016   History of Present Illness  Presented with one-week history of URI symptoms with cough generalized fatigue decreased by mouth intake, nasal congestion. in the past 24 hours her symptoms have worsened and she developed a fever   Clinical Impression  Patient evaluated by Physical Therapy with no further acute PT needs identified. All education has been completed and the patient has no further questions.  See below for any follow-up Physical Therapy or equipment needs. PT is signing off. Thank you for this referral.     Follow Up Recommendations No PT follow up    Equipment Recommendations  None recommended by PT    Recommendations for Other Services       Precautions / Restrictions Restrictions Weight Bearing Restrictions: No      Mobility  Bed Mobility               General bed mobility comments: NT, pt in chair  Transfers Overall transfer level: Needs assistance Equipment used: Rolling walker (2 wheeled) Transfers: Sit to/from Stand Sit to Stand: Supervision         General transfer comment: for safety from chair; pt able to rise and control descent without physical assist  Ambulation/Gait Ambulation/Gait assistance: Min guard;Supervision Ambulation Distance (Feet): 140 Feet Assistive device: Rolling walker (2 wheeled) Gait Pattern/deviations: Step-through pattern;Trunk flexed     General Gait Details: cues for RW safety during turns--pt tends to step outside [employer] Mobility    Modified Rankin (Stroke Patients Only)       Balance Overall balance assessment: Needs assistance (denies falls) Sitting-balance support: Feet supported;No upper extremity supported Sitting balance-Leahy Scale: Good       Standing balance-Leahy Scale: Fair               High level balance activites: Direction  changes;Turns;Head turns High Level Balance Comments: no LB, uses RW at baseline; unsafe with RW during turns(although no unsteadiness noted), cautioned pt to avoid this at home to improve safety/balance             Pertinent Vitals/Pain Pain Assessment: No/denies pain    Home Living Family/patient expects to be discharged to:: Private residence Living Arrangements: Alone Available Help at Discharge: Family (sister and grandchildren) Type of Home: Apartment Home Access: Level entry (threshold only)     Home Layout: One level Home Equipment: Walker - 2 wheels;Cane - single point;Toilet riser      Prior Function                 Hand Dominance        Extremity/Trunk Assessment   Upper Extremity Assessment Upper Extremity Assessment: Overall WFL for tasks assessed    Lower Extremity Assessment Lower Extremity Assessment: Overall WFL for tasks assessed       Communication      Cognition   Behavior During Therapy: WFL for tasks assessed/performed Overall Cognitive Status: Within Functional Limits for tasks assessed                      General Comments      Exercises     Assessment/Plan    PT Assessment Patent does not need any further PT services  PT Problem List            PT Treatment Interventions  PT Goals (Current goals can be found in the Care Plan section)  Acute Rehab PT Goals PT Goal Formulation: All assessment and education complete, DC therapy    Frequency     Barriers to discharge        Co-evaluation               End of Session   Activity Tolerance: Patient tolerated treatment well Patient left: in chair;with call bell/phone within reach;with chair alarm set;with family/visitor present           Time: [id number] PT Time Calculation (min) (ACUTE ONLY): 13 min   Charges:   PT Evaluation $PT Eval Low Complexity: 1 Procedure     PT G Codes:        Atom Solivan 07/10/2016, 1:21 PM

## 2016-07-03 LAB — CULTURE, BLOOD (ROUTINE X 2)
CULTURE: NO GROWTH
Culture: NO GROWTH

## 2016-12-27 ENCOUNTER — Emergency Department (HOSPITAL_COMMUNITY): Payer: Medicare Other

## 2016-12-27 ENCOUNTER — Emergency Department (HOSPITAL_COMMUNITY)
Admission: EM | Admit: 2016-12-27 | Discharge: 2016-12-28 | Disposition: A | Discharge status: Home / Self Care | Payer: Medicare Other | Attending: Emergency Medicine | Admitting: Emergency Medicine

## 2016-12-27 ENCOUNTER — Encounter (HOSPITAL_COMMUNITY): Payer: Self-pay | Admitting: Emergency Medicine

## 2016-12-27 DIAGNOSIS — I110 Hypertensive heart disease with heart failure: Secondary | ICD-10-CM | POA: Insufficient documentation

## 2016-12-27 DIAGNOSIS — R42 Dizziness and giddiness: Secondary | ICD-10-CM | POA: Diagnosis not present

## 2016-12-27 DIAGNOSIS — R112 Nausea with vomiting, unspecified: Secondary | ICD-10-CM | POA: Diagnosis present

## 2016-12-27 DIAGNOSIS — Z7982 Long term (current) use of aspirin: Secondary | ICD-10-CM | POA: Diagnosis not present

## 2016-12-27 DIAGNOSIS — E119 Type 2 diabetes mellitus without complications: Secondary | ICD-10-CM | POA: Diagnosis not present

## 2016-12-27 DIAGNOSIS — I5032 Chronic diastolic (congestive) heart failure: Secondary | ICD-10-CM | POA: Diagnosis not present

## 2016-12-27 DIAGNOSIS — Z79899 Other long term (current) drug therapy: Secondary | ICD-10-CM | POA: Diagnosis not present

## 2016-12-27 DIAGNOSIS — R5383 Other fatigue: Secondary | ICD-10-CM | POA: Diagnosis not present

## 2016-12-27 LAB — COMPREHENSIVE METABOLIC PANEL
ALBUMIN: 3.5 g/dL (ref 3.5–5.0)
ALK PHOS: 77 U/L (ref 38–126)
ALT: 20 U/L (ref 14–54)
ANION GAP: 6 (ref 5–15)
AST: 22 U/L (ref 15–41)
BILIRUBIN TOTAL: 0.5 mg/dL (ref 0.3–1.2)
BUN: 16 mg/dL (ref 6–20)
CALCIUM: 9.4 mg/dL (ref 8.9–10.3)
CO2: 26 mmol/L (ref 22–32)
CREATININE: 0.74 mg/dL (ref 0.44–1.00)
Chloride: 102 mmol/L (ref 101–111)
GFR calc non Af Amer: >60 mL/min (ref >60)
GLUCOSE: 127 mg/dL — AB (ref 65–99)
Potassium: 3.5 mmol/L (ref 3.5–5.1)
Sodium: 134 mmol/L — ABNORMAL LOW (ref 135–145)
TOTAL PROTEIN: 8.9 g/dL — AB (ref 6.5–8.1)

## 2016-12-27 LAB — URINALYSIS, ROUTINE W REFLEX MICROSCOPIC
Bacteria, UA: NONE SEEN
Bilirubin Urine: NEGATIVE
GLUCOSE, UA: NEGATIVE mg/dL
HGB URINE DIPSTICK: NEGATIVE
Ketones, ur: NEGATIVE mg/dL
Leukocytes, UA: NEGATIVE
NITRITE: NEGATIVE
PH: 5 (ref 5.0–8.0)
PROTEIN: 100 mg/dL — AB
SPECIFIC GRAVITY, URINE: 1.016 (ref 1.005–1.030)
Squamous Epithelial / LPF: NONE SEEN

## 2016-12-27 LAB — LIPASE, BLOOD: Lipase: 22 U/L (ref 11–51)

## 2016-12-27 LAB — CBC
HCT: 36.5 % (ref 36.0–46.0)
HEMOGLOBIN: 12.2 g/dL (ref 12.0–15.0)
MCH: 30.5 pg (ref 26.0–34.0)
MCHC: 33.4 g/dL (ref 30.0–36.0)
MCV: 91.3 fL (ref 78.0–100.0)
PLATELETS: 200 10*3/uL (ref 150–400)
RBC: 4 MIL/uL (ref 3.87–5.11)
RDW: 14.6 % (ref 11.5–15.5)
WBC: 4.5 10*3/uL (ref 4.0–10.5)

## 2016-12-27 LAB — I-STAT CG4 LACTIC ACID, ED: Lactic Acid, Venous: 1.83 mmol/L (ref 0.5–1.9)

## 2016-12-27 MED ORDER — ONDANSETRON HCL 4 MG/2ML IJ SOLN
4.0000 mg | Freq: Once | INTRAMUSCULAR | Status: AC
  Administered 2016-12-27: 4 mg via INTRAVENOUS
  Filled 2016-12-27 (×2): qty 2

## 2016-12-27 MED ORDER — MECLIZINE HCL 25 MG PO TABS
25.0000 mg | ORAL_TABLET | Freq: Once | ORAL | Status: AC
  Administered 2016-12-27: 25 mg via ORAL
  Filled 2016-12-27: qty 1

## 2016-12-27 MED ORDER — SODIUM CHLORIDE 0.9 % IV BOLUS (SEPSIS)
1000.0000 mL | Freq: Once | INTRAVENOUS | Status: AC
  Administered 2016-12-27: 1000 mL via INTRAVENOUS

## 2016-12-27 MED ORDER — SODIUM CHLORIDE 0.9 % IV SOLN: INTRAVENOUS | Status: DC

## 2016-12-27 NOTE — ED Triage Notes (Signed)
Patient c/o vomiting and fatigue since yesterday. Denies abdominal pain, chest pain, and SOB.

## 2016-12-27 NOTE — ED Notes (Signed)
Saline locked pt's IV to transport to MRI

## 2016-12-27 NOTE — ED Notes (Signed)
Patient transported to MRI 

## 2016-12-27 NOTE — ED Notes (Signed)
Attemtps x 2 to start IV by 2 different persons with no success.

## 2016-12-27 NOTE — ED Provider Notes (Addendum)
[facility] DEPT Provider Note   CSN: [id number] Arrival date & time: 12/27/16  1700     History   Chief Complaint Chief Complaint  Patient presents with  . Emesis  . Fatigue  . Dizziness    HPI Megan Ryan is a 81 y.o. female.  82 year old female presents with nonbilious emesis and weakness 1 day. Denies any associated abdominal pain. Her emesis waxes and wanes and nothing seems to make it better or worse. She is not short of breath denies any cough. Has had urinary frequency times several days. History of sepsis 6 months ago and she states that this feels similar. Denies any fever at home. No chills or myalgias. No rashes noted. Symptoms have been persistent throughout the day and no treatment using them prior to arrival      Past Medical History:  Diagnosis Date  . Diabetes mellitus ([place]) 06/27/2016  . Diastolic HF (heart failure) ([place])   . Hypertension     Patient Active Problem List   Diagnosis Date Noted  . Intention tremor 06/29/2016  . Chronic diastolic heart failure ([place]) 06/29/2016  . Hypophosphatemia 06/29/2016  . Urinary tract infection 06/29/2016  . Sepsis ([place]) 06/27/2016  . Acute respiratory failure with hypoxia ([place]) 06/27/2016  . CAP (community acquired pneumonia) 06/27/2016  . Hypokalemia 06/27/2016  . Dehydration 06/27/2016  . Essential hypertension 06/27/2016  . Diabetes mellitus ([place]) 06/27/2016  . Cardiomegaly 06/27/2016  . No blood products 06/27/2016    History reviewed. No pertinent surgical history.  OB History    No data available       Home Medications    Prior to Admission medications   Medication Sig Start Date End Date Taking? Authorizing Provider  albuterol (PROVENTIL HFA;VENTOLIN HFA) 108 (90 Base) MCG/ACT inhaler Inhale 1-2 puffs into the lungs every 6 (six) hours as needed for wheezing or shortness of breath. 07/02/16   Florencia Reasons, MD  aspirin EC 81 MG tablet Take 81 mg by mouth daily.    [provider]    famotidine (PEPCID) 20 MG tablet Take 1 tablet (20 mg total) by mouth at bedtime. 07/02/16   Florencia Reasons, MD  furosemide (LASIX) 40 MG tablet Take 1 tablet (40 mg total) by mouth daily as needed for fluid or edema. 07/02/16   Florencia Reasons, MD  guaiFENesin (MUCINEX) 600 MG 12 hr tablet Take 1 tablet (600 mg total) by mouth 2 (two) times daily. 07/02/16   Florencia Reasons, MD  linaclotide [facility]) 72 MCG capsule Take 72 mcg by mouth daily before breakfast.    [provider]  magnesium oxide (MAG-OX) 400 MG tablet Take 1 tablet (400 mg total) by mouth daily. 07/02/16   Florencia Reasons, MD  ondansetron (ZOFRAN ODT) 4 MG disintegrating tablet Take 1 tablet (4 mg total) by mouth every 4 (four) hours as needed for nausea or vomiting. Patient not taking: Reported on 06/27/2016 05/17/16   Charlesetta Shanks, MD  polyethylene glycol (MIRALAX / GLYCOLAX) packet Take 17 g by mouth daily as needed for mild constipation. 07/02/16   Florencia Reasons, MD  potassium chloride SA (K-DUR,KLOR-CON) 20 MEQ tablet Take 1 tablet (20 mEq total) by mouth every Monday, Wednesday, and Friday. 07/04/16   Florencia Reasons, MD  propranolol ER (INDERAL LA) 60 MG 24 hr capsule Take 1 capsule (60 mg total) by mouth daily. 07/03/16   Florencia Reasons, MD    Family History Family History  Problem Relation Age of Onset  . Breast cancer Mother   .  Diabetes Mother   . Diabetes Sister   . Prostate cancer Brother   . Diabetes Brother   . Diabetes Father   . CAD Neg Hx     Social History Social History  Substance Use Topics  . Smoking status: Never Smoker  . Smokeless tobacco: Never Used  . Alcohol use No     Allergies   Patient has no known allergies.   Review of Systems Review of Systems  All other systems reviewed and are negative.    Physical Exam Updated Vital Signs BP (!) 167/84 (BP Location: Right Arm)   Pulse 64   Temp 97.7 F (36.5 C) (Oral)   Resp 18   SpO2 98%   Physical Exam  Constitutional: She is oriented to person, place, and time. She  appears well-developed and well-nourished.  Non-toxic appearance. No distress.  HENT:  Head: Normocephalic and atraumatic.  Eyes: Conjunctivae, EOM and lids are normal. Pupils are equal, round, and reactive to light.  Neck: Normal range of motion. Neck supple. No tracheal deviation present. No thyroid mass present.  Cardiovascular: Normal rate, regular rhythm and normal heart sounds.  Exam reveals no gallop.   No murmur heard. Pulmonary/Chest: Effort normal and breath sounds normal. No stridor. No respiratory distress. She has no decreased breath sounds. She has no wheezes. She has no rhonchi. She has no rales.  Abdominal: Soft. Normal appearance and bowel sounds are normal. She exhibits no distension. There is no tenderness. There is no rebound and no CVA tenderness.  Musculoskeletal: Normal range of motion. She exhibits no edema or tenderness.  Neurological: She is alert and oriented to person, place, and time. She has normal strength. No cranial nerve deficit or sensory deficit. GCS eye subscore is 4. GCS verbal subscore is 5. GCS motor subscore is 6.  Skin: Skin is warm and dry. No abrasion and no rash noted.  Psychiatric: She has a normal mood and affect. Her speech is normal and behavior is normal.  Nursing note and vitals reviewed.    ED Treatments / Results  Labs (all labs ordered are listed, but only abnormal results are displayed) Labs Reviewed  URINE CULTURE  LIPASE, BLOOD  COMPREHENSIVE METABOLIC PANEL  CBC  URINALYSIS, ROUTINE W REFLEX MICROSCOPIC  I-STAT CG4 LACTIC ACID, ED    EKG  EKG Interpretation None       Radiology No results found.  Procedures Procedures (including critical care time)  Medications Ordered in ED Medications  sodium chloride 0.9 % bolus 1,000 mL (not administered)  0.9 %  sodium chloride infusion (not administered)  ondansetron (ZOFRAN) injection 4 mg (not administered)     Initial Impression / Assessment and Plan / ED Course  I  have reviewed the triage vital signs and the nursing notes.  Pertinent labs & imaging results that were available during my care of the patient were reviewed by me and considered in my medical decision making (see chart for details).     Patient had complained of having dizziness and nausea. Says symptoms are worse when she makes sudden movements. States that she is off balance when she walks. Does have mild nystagmus. Suspect possible vertigo. Brain MRI ordered to rule out central cause. The result that was negative. She does feel better after receiving Antivert and will be discharged home  Final Clinical Impressions(s) / ED Diagnoses   Final diagnoses:  None    New Prescriptions New Prescriptions   No medications on file     Zenia Resides,  Elberta Fortis, MD 12/27/16 2349    Lacretia Leigh, MD 12/28/16 0001

## 2016-12-27 NOTE — ED Notes (Signed)
IV team in room  

## 2016-12-28 MED ORDER — MECLIZINE HCL 12.5 MG PO TABS: 12.5000 mg | ORAL_TABLET | Freq: Three times a day (TID) | ORAL | 0 refills | Status: DC | PRN

## 2016-12-28 NOTE — ED Notes (Signed)
Returned from MRI 

## 2016-12-29 LAB — URINE CULTURE: Culture: NO GROWTH

## 2018-01-12 ENCOUNTER — Ambulatory Visit (INDEPENDENT_AMBULATORY_CARE_PROVIDER_SITE_OTHER): Payer: Medicare Other

## 2018-01-12 ENCOUNTER — Encounter (HOSPITAL_COMMUNITY): Payer: Self-pay | Admitting: Emergency Medicine

## 2018-01-12 ENCOUNTER — Ambulatory Visit (HOSPITAL_COMMUNITY)
Admission: EM | Admit: 2018-01-12 | Discharge: 2018-01-12 | Disposition: A | Discharge status: Home / Self Care | Payer: Medicare Other | Attending: Internal Medicine | Admitting: Internal Medicine

## 2018-01-12 DIAGNOSIS — R109 Unspecified abdominal pain: Secondary | ICD-10-CM

## 2018-01-12 DIAGNOSIS — K5900 Constipation, unspecified: Secondary | ICD-10-CM | POA: Diagnosis not present

## 2018-01-12 DIAGNOSIS — R1032 Left lower quadrant pain: Secondary | ICD-10-CM

## 2018-01-12 LAB — POCT URINALYSIS DIP (DEVICE)
BILIRUBIN URINE: NEGATIVE
Glucose, UA: NEGATIVE mg/dL
HGB URINE DIPSTICK: NEGATIVE
KETONES UR: NEGATIVE mg/dL
LEUKOCYTES UA: NEGATIVE
Nitrite: NEGATIVE
PROTEIN: NEGATIVE mg/dL
SPECIFIC GRAVITY, URINE: 1.015 (ref 1.005–1.030)
Urobilinogen, UA: 0.2 mg/dL (ref 0.0–1.0)
pH: 6 (ref 5.0–8.0)

## 2018-01-12 MED ORDER — DICYCLOMINE HCL 20 MG PO TABS: 20.0000 mg | ORAL_TABLET | Freq: Two times a day (BID) | ORAL | 0 refills | Status: DC | PRN

## 2018-01-12 NOTE — ED Provider Notes (Signed)
[facility]    CSN: [id number] Arrival date & time: 01/12/18  1306     History   Chief Complaint Chief Complaint  Patient presents with  . Back Pain    HPI Megan Ryan is a 82 y.o. female.   Patient has a history of borderline diabetes and constipation, has been taking Linzess for the last year.  Last bowel movement was on Wednesday, 2 days ago.  She had the onset 5 days ago of intermittent crampy discomfort in the LLQ>RLQ.  This was not too severe initially, but became more severe yesterday.  She has not had any vomiting.  No unusual vaginal discharge or bleeding.  No dysuria.  She does have chronic nocturia, q1 hr all night, for the last 2 to 3 years.  No fever, no malaise.  HPI  Past Medical History:  Diagnosis Date  . Diabetes mellitus ([place]) 06/27/2016  . Diastolic HF (heart failure) ([place])   . Hypertension     Patient Active Problem List   Diagnosis Date Noted  . Intention tremor 06/29/2016  . Chronic diastolic heart failure ([place]) 06/29/2016  . Hypophosphatemia 06/29/2016  . Urinary tract infection 06/29/2016  . Sepsis ([place]) 06/27/2016  . Acute respiratory failure with hypoxia ([place]) 06/27/2016  . CAP (community acquired pneumonia) 06/27/2016  . Hypokalemia 06/27/2016  . Dehydration 06/27/2016  . Essential hypertension 06/27/2016  . Diabetes mellitus ([place]) 06/27/2016  . Cardiomegaly 06/27/2016  . No blood products 06/27/2016    History reviewed. No pertinent surgical history.   Home Medications    Prior to Admission medications   Medication Sig Start Date End Date Taking? Authorizing Provider  albuterol (PROVENTIL HFA;VENTOLIN HFA) 108 (90 Base) MCG/ACT inhaler Inhale 1-2 puffs into the lungs every 6 (six) hours as needed for wheezing or shortness of breath. Patient not taking: Reported on 12/27/2016 07/02/16   Florencia Reasons, MD  aspirin EC 81 MG tablet Take 81 mg by mouth daily.    [provider]  dicyclomine (BENTYL) 20 MG tablet Take 1 tablet  (20 mg total) by mouth 2 (two) times daily as needed for spasms. 01/12/18   Wynona Luna, MD  famotidine (PEPCID) 20 MG tablet Take 1 tablet (20 mg total) by mouth at bedtime. Patient not taking: Reported on 12/27/2016 07/02/16   Florencia Reasons, MD  furosemide (LASIX) 40 MG tablet Take 1 tablet (40 mg total) by mouth daily as needed for fluid or edema. Patient not taking: Reported on 12/27/2016 07/02/16   Florencia Reasons, MD  guaiFENesin (MUCINEX) 600 MG 12 hr tablet Take 1 tablet (600 mg total) by mouth 2 (two) times daily. Patient not taking: Reported on 12/27/2016 07/02/16   Florencia Reasons, MD  magnesium oxide (MAG-OX) 400 MG tablet Take 1 tablet (400 mg total) by mouth daily. 07/02/16   Florencia Reasons, MD  polyethylene glycol [facility] Floria Raveling) packet Take 17 g by mouth daily as needed for mild constipation. Patient not taking: Reported on 12/27/2016 07/02/16   Florencia Reasons, MD  potassium chloride SA (K-DUR,KLOR-CON) 20 MEQ tablet Take 1 tablet (20 mEq total) by mouth every Monday, Wednesday, and Friday. Patient not taking: Reported on 12/27/2016 07/04/16   Florencia Reasons, MD  propranolol ER (INDERAL LA) 60 MG 24 hr capsule Take 1 capsule (60 mg total) by mouth daily. Patient not taking: Reported on 12/27/2016 07/03/16   Florencia Reasons, MD    Family History Family History  Problem Relation Age of Onset  . Breast cancer Mother   .  Diabetes Mother   . Diabetes Sister   . Prostate cancer Brother   . Diabetes Brother   . Diabetes Father   . CAD Neg Hx     Social History Social History   Tobacco Use  . Smoking status: Never Smoker  . Smokeless tobacco: Never Used  Substance Use Topics  . Alcohol use: No  . Drug use: No     Allergies   Patient has no known allergies.   Review of Systems Review of Systems  All other systems reviewed and are negative.    Physical Exam Triage Vital Signs ED Triage Vitals [01/12/18 1358]  Enc Vitals Group     BP (!) 173/83     Pulse Rate 75     Resp 18     Temp 98.1 F (36.7 C)     Temp  Source Oral     SpO2 97 %     Weight      Height      Pain Score    Updated Vital Signs BP (!) 173/83 (BP Location: Left Arm)   Pulse 75   Temp 98.1 F (36.7 C) (Oral)   Resp 18   SpO2 97%  Physical Exam  Constitutional: She is oriented to person, place, and time. No distress.  Alert, nicely groomed  HENT:  Head: Atraumatic.  Eyes:  Conjugate gaze, no eye redness/drainage  Neck: Neck supple.  Cardiovascular: Normal rate and regular rhythm.  Pulmonary/Chest: No respiratory distress. She has no wheezes. She has no rales.  Lungs clear, symmetric breath sounds  Abdominal: Soft. She exhibits no distension. There is no rebound and no guarding.  Protuberant, mild tenderness to deep palpation in the LLQ> RLQ.  Patient does not guard, there is no rebound.  She does have intermittent crampy spasms of pain.  Musculoskeletal: Normal range of motion.  Trace bilateral pitting leg swelling             Neurological: She is alert and oriented to person, place, and time.  Skin: Skin is warm and dry.  No cyanosis  Nursing note and vitals reviewed.    UC Treatments / Results  Labs Results for orders placed or performed during the hospital encounter of 01/12/18  POCT urinalysis dip (device)  Result Value Ref Range   Glucose, UA NEGATIVE NEGATIVE mg/dL   Bilirubin Urine NEGATIVE NEGATIVE   Ketones, ur NEGATIVE NEGATIVE mg/dL   Specific Gravity, Urine 1.015 1.005 - 1.030   Hgb urine dipstick NEGATIVE NEGATIVE   pH 6.0 5.0 - 8.0   Protein, ur NEGATIVE NEGATIVE mg/dL   Urobilinogen, UA 0.2 0.0 - 1.0 mg/dL   Nitrite NEGATIVE NEGATIVE   Leukocytes, UA NEGATIVE NEGATIVE    Radiology EXAM: DG ABDOMEN ACUTE W/ 1V CHEST  COMPARISON: Abdomen series December 27, 2016  FINDINGS: PA chest: No edema or consolidation. There is stable cardiomegaly. No adenopathy. There is aortic atherosclerosis.  Supine and upright abdomen. There is fairly diffuse stool throughout the colon. There is  no bowel dilatation or air-fluid level to suggest bowel obstruction. No free air. Postoperative changes noted in the pelvis.  IMPRESSION: Diffuse stool throughout colon. No evident bowel obstruction or free air. No edema or consolidation. Stable cardiomegaly. Aortic atherosclerosis present.  Aortic Atherosclerosis (ICD10-I70.0).   Electronically Signed By: Lowella Grip III M.D. On: 01/12/2018 15:39   Final Clinical Impressions(s) / UC Diagnoses   Final diagnoses:  Abdominal cramping in left lower quadrant     Discharge Instructions  Urine test at the urgent care did not suggest infection or kidney stone.  Abdominal x-ray demonstrated a lot of stool, which may be contributing to crampy abdominal discomfort.  Continue linzess.  Prescription for dicyclomine (for abdominal cramping) was sent to the pharmacy.  Followup with your primary care provider soon to discuss other management strategies for constipation and cramping.      ED Prescriptions    Medication Sig Dispense Auth. Provider   dicyclomine (BENTYL) 20 MG tablet Take 1 tablet (20 mg total) by mouth 2 (two) times daily as needed for spasms. 10 tablet Wynona Luna, MD        Wynona Luna, MD 01/15/18 [phone]

## 2018-01-12 NOTE — ED Triage Notes (Signed)
Pt here for back and flank pain; pt thinks cough be from an "infection"

## 2018-01-12 NOTE — Discharge Instructions (Signed)
Urine test at the urgent care did not suggest infection or kidney stone.  Abdominal x-ray demonstrated a lot of stool, which may be contributing to crampy abdominal discomfort.  Continue linzess.  Prescription for dicyclomine (for abdominal cramping) was sent to the pharmacy.  Followup with your primary care provider soon to discuss other management strategies for constipation and cramping.

## 2019-07-31 ENCOUNTER — Other Ambulatory Visit: Payer: Self-pay

## 2019-07-31 ENCOUNTER — Ambulatory Visit (HOSPITAL_COMMUNITY)
Admission: EM | Admit: 2019-07-31 | Discharge: 2019-07-31 | Disposition: A | Discharge status: Home / Self Care | Payer: Medicare HMO | Attending: Urgent Care | Admitting: Urgent Care

## 2019-07-31 ENCOUNTER — Encounter (HOSPITAL_COMMUNITY): Payer: Self-pay

## 2019-07-31 ENCOUNTER — Ambulatory Visit (INDEPENDENT_AMBULATORY_CARE_PROVIDER_SITE_OTHER): Payer: Medicare HMO

## 2019-07-31 DIAGNOSIS — R52 Pain, unspecified: Secondary | ICD-10-CM | POA: Diagnosis present

## 2019-07-31 DIAGNOSIS — Z803 Family history of malignant neoplasm of breast: Secondary | ICD-10-CM | POA: Insufficient documentation

## 2019-07-31 DIAGNOSIS — M542 Cervicalgia: Secondary | ICD-10-CM | POA: Diagnosis not present

## 2019-07-31 DIAGNOSIS — Z7982 Long term (current) use of aspirin: Secondary | ICD-10-CM | POA: Insufficient documentation

## 2019-07-31 DIAGNOSIS — I5030 Unspecified diastolic (congestive) heart failure: Secondary | ICD-10-CM | POA: Insufficient documentation

## 2019-07-31 DIAGNOSIS — M5412 Radiculopathy, cervical region: Secondary | ICD-10-CM

## 2019-07-31 DIAGNOSIS — Z8679 Personal history of other diseases of the circulatory system: Secondary | ICD-10-CM

## 2019-07-31 DIAGNOSIS — Z20822 Contact with and (suspected) exposure to covid-19: Secondary | ICD-10-CM | POA: Diagnosis not present

## 2019-07-31 DIAGNOSIS — I110 Hypertensive heart disease with heart failure: Secondary | ICD-10-CM | POA: Diagnosis not present

## 2019-07-31 DIAGNOSIS — Z833 Family history of diabetes mellitus: Secondary | ICD-10-CM | POA: Insufficient documentation

## 2019-07-31 DIAGNOSIS — Z79899 Other long term (current) drug therapy: Secondary | ICD-10-CM | POA: Diagnosis not present

## 2019-07-31 DIAGNOSIS — I10 Essential (primary) hypertension: Secondary | ICD-10-CM

## 2019-07-31 DIAGNOSIS — R5381 Other malaise: Secondary | ICD-10-CM | POA: Diagnosis not present

## 2019-07-31 DIAGNOSIS — M47812 Spondylosis without myelopathy or radiculopathy, cervical region: Secondary | ICD-10-CM

## 2019-07-31 LAB — POCT URINALYSIS DIP (DEVICE)
Bilirubin Urine: NEGATIVE
Glucose, UA: NEGATIVE mg/dL
Hgb urine dipstick: NEGATIVE
Ketones, ur: NEGATIVE mg/dL
Nitrite: NEGATIVE
Protein, ur: NEGATIVE mg/dL
Specific Gravity, Urine: >=1.03 (ref 1.005–1.030)
Urobilinogen, UA: 0.2 mg/dL (ref 0.0–1.0)
pH: 5.5 (ref 5.0–8.0)

## 2019-07-31 MED ORDER — ACETAMINOPHEN 325 MG PO TABS
ORAL_TABLET | ORAL | Status: AC
  Filled 2019-07-31: qty 1

## 2019-07-31 MED ORDER — ACETAMINOPHEN 325 MG PO TABS
650.0000 mg | ORAL_TABLET | Freq: Once | ORAL | Status: AC
  Administered 2019-07-31: 650 mg via ORAL

## 2019-07-31 MED ORDER — PREDNISONE 20 MG PO TABS: ORAL_TABLET | ORAL | 0 refills | Status: DC

## 2019-07-31 NOTE — ED Provider Notes (Signed)
[facility]   MRN: [id number] DOB: 1935-07-27  Subjective:   Megan Ryan is a 84 y.o. female presenting for 2-day history of generalized body aches, pain is worst over her neck.  She has used Advil which caused some stomach upset and feels like her aches are worse after using that.  Has a history of hypertension, heart failure.  She is compliant with these medications. Denies direct COVID 19 contacts/exposure.  Patient lives on her own at home, tries to practice social distancing.   No current facility-administered medications for this encounter.  Current Outpatient Medications:  .  linaclotide (LINZESS) 72 MCG capsule, Take 72 mcg by mouth daily before breakfast., Disp: , Rfl:  .  albuterol (PROVENTIL HFA;VENTOLIN HFA) 108 (90 Base) MCG/ACT inhaler, Inhale 1-2 puffs into the lungs every 6 (six) hours as needed for wheezing or shortness of breath. (Patient not taking: Reported on 12/27/2016), Disp: 1 Inhaler, Rfl: 0 .  amLODipine (NORVASC) 5 MG tablet, Take 5 mg by mouth daily., Disp: , Rfl:  .  aspirin EC 81 MG tablet, Take 81 mg by mouth daily., Disp: , Rfl:  .  dicyclomine (BENTYL) 20 MG tablet, Take 1 tablet (20 mg total) by mouth 2 (two) times daily as needed for spasms., Disp: 10 tablet, Rfl: 0 .  famotidine (PEPCID) 20 MG tablet, Take 1 tablet (20 mg total) by mouth at bedtime. (Patient not taking: Reported on 12/27/2016), Disp: 30 tablet, Rfl: 0 .  furosemide (LASIX) 40 MG tablet, Take 1 tablet (40 mg total) by mouth daily as needed for fluid or edema. (Patient not taking: Reported on 12/27/2016), Disp: 30 tablet, Rfl: 0 .  guaiFENesin (MUCINEX) 600 MG 12 hr tablet, Take 1 tablet (600 mg total) by mouth 2 (two) times daily. (Patient not taking: Reported on 12/27/2016), Disp: 30 tablet, Rfl: 0 .  magnesium oxide (MAG-OX) 400 MG tablet, Take 1 tablet (400 mg total) by mouth daily., Disp: 30 tablet, Rfl: 0 .  polyethylene glycol (MIRALAX / GLYCOLAX) packet, Take 17 g by mouth daily  as needed for mild constipation. (Patient not taking: Reported on 12/27/2016), Disp: 14 each, Rfl: 0 .  potassium chloride SA (K-DUR,KLOR-CON) 20 MEQ tablet, Take 1 tablet (20 mEq total) by mouth every Monday, Wednesday, and Friday. (Patient not taking: Reported on 12/27/2016), Disp: 30 tablet, Rfl: 0 .  propranolol ER (INDERAL LA) 60 MG 24 hr capsule, Take 1 capsule (60 mg total) by mouth daily. (Patient not taking: Reported on 12/27/2016), Disp: 30 capsule, Rfl: 0   No Known Allergies  Past Medical History:  Diagnosis Date  . Diabetes mellitus ([place]) 06/27/2016  . Diastolic HF (heart failure) ([place])   . Hypertension      History reviewed. No pertinent surgical history.  Family History  Problem Relation Age of Onset  . Breast cancer Mother   . Diabetes Mother   . Diabetes Sister   . Prostate cancer Brother   . Diabetes Brother   . Diabetes Father   . CAD Neg Hx     Social History   Tobacco Use  . Smoking status: Never Smoker  . Smokeless tobacco: Never Used  Substance Use Topics  . Alcohol use: No  . Drug use: No    Review of Systems  Constitutional: Positive for malaise/fatigue. Negative for fever.  HENT: Negative for congestion, ear pain, sinus pain and sore throat.   Eyes: Negative for discharge and redness.  Respiratory: Negative for cough, hemoptysis, shortness of breath and wheezing.  Cardiovascular: Negative for chest pain.  Gastrointestinal: Positive for nausea. Negative for abdominal pain, diarrhea and vomiting.  Genitourinary: Negative for dysuria, flank pain and hematuria.  Musculoskeletal: Positive for joint pain, myalgias and neck pain.  Skin: Negative for rash.  Neurological: Negative for dizziness, weakness and headaches.  Psychiatric/Behavioral: Negative for depression and substance abuse.     Objective:   Vitals: BP (!) 156/81 (BP Location: Right Arm)   Pulse 87   Resp (!) 23   SpO2 95%   Physical Exam Constitutional:      General: She is not in  acute distress.    Appearance: Normal appearance. She is well-developed. She is obese. She is not ill-appearing, toxic-appearing or diaphoretic.  HENT:     Head: Normocephalic and atraumatic.     Nose: Nose normal.     Mouth/Throat:     Mouth: Mucous membranes are moist.  Eyes:     Extraocular Movements: Extraocular movements intact.     Pupils: Pupils are equal, round, and reactive to light.  Cardiovascular:     Rate and Rhythm: Normal rate and regular rhythm.     Pulses: Normal pulses.     Heart sounds: Normal heart sounds. No murmur. No friction rub. No gallop.   Pulmonary:     Effort: Pulmonary effort is normal. No respiratory distress.     Breath sounds: Normal breath sounds. No stridor. No wheezing, rhonchi or rales.  Abdominal:     General: Bowel sounds are normal. There is no distension.     Palpations: Abdomen is soft. There is no mass.     Tenderness: There is no abdominal tenderness. There is no guarding or rebound.  Musculoskeletal:     Cervical back: Spasms and tenderness present. No swelling, edema, deformity, erythema, signs of trauma, lacerations, rigidity, torticollis, bony tenderness or crepitus. Pain with movement present. Normal range of motion.     Thoracic back: No swelling, edema, deformity, signs of trauma, spasms, tenderness or bony tenderness. Normal range of motion. No scoliosis.  Skin:    General: Skin is warm and dry.     Findings: No rash.  Neurological:     Mental Status: She is alert and oriented to person, place, and time.     Gait: Gait abnormal (Uses a cane to ambulate).  Psychiatric:        Mood and Affect: Mood normal.        Behavior: Behavior normal.        Thought Content: Thought content normal.        Judgment: Judgment normal.     Results for orders placed or performed during the hospital encounter of 07/31/19 (from the past 24 hour(s))  POCT urinalysis dip (device)     Status: Abnormal   Collection Time: 07/31/19 12:47 PM  Result  Value Ref Range   Glucose, UA NEGATIVE NEGATIVE mg/dL   Bilirubin Urine NEGATIVE NEGATIVE   Ketones, ur NEGATIVE NEGATIVE mg/dL   Specific Gravity, Urine >=1.030 1.005 - 1.030   Hgb urine dipstick NEGATIVE NEGATIVE   pH 5.5 5.0 - 8.0   Protein, ur NEGATIVE NEGATIVE mg/dL   Urobilinogen, UA 0.2 0.0 - 1.0 mg/dL   Nitrite NEGATIVE NEGATIVE   Leukocytes,Ua TRACE (A) NEGATIVE    ED ECG REPORT   Date: 07/31/2019  Rate: 83bpm  Rhythm: normal sinus rhythm  QRS Axis: left  Intervals: normal  ST/T Wave abnormalities: nonspecific T wave changes  Conduction Disutrbances:first-degree A-V block   Narrative Interpretation: sinus rhythm  with first degree AV block at 83 bpm. T wave inversion in V2, V3; t-wave flattening in I-III, aVL, aVF, V5-6. Largely unchanged from previous ecgs.   Old EKG Reviewed: unchanged  I have personally reviewed the EKG tracing and agree with the computerized printout as noted.  DG Cervical Spine Complete  Result Date: 07/31/2019 CLINICAL DATA:  Neck pain for 3 days with bilateral upper extremity radiculopathy EXAM: CERVICAL SPINE - COMPLETE 4+ VIEW COMPARISON:  09/25/2015 FINDINGS: Frontal, lateral, and bilateral oblique views of the cervical spine are obtained. Persistent cervical kyphosis centered at C5, stable. There is extensive multilevel cervical spondylosis most pronounced from C5 through T1, with disc space narrowing and osteophyte formation unchanged since prior exam. There is no significant bony encroachment upon the central canal or neural foramina. Lung apices are clear. IMPRESSION: 1. Stable lower cervical spondylosis and kyphosis. Electronically Signed   By: Randa Ngo M.D.   On: 07/31/2019 13:13    Assessment and Plan :   1. Cervical spondylosis   2. Body aches   3. Neck pain   4. Malaise   5. History of heart failure   6. Essential hypertension     Primary focus of her pain is in her neck and she has significant cervical spondylosis as seen on  the x-ray.  COVID-19 testing is pending but have low suspicion for this.  Will use oral prednisone courses patient declined steroid injection.  She is to follow-up with neurosurgery and cardiology.  Check back with PCP as well for her blood pressure and general health maintenance. Counseled patient on potential for adverse effects with medications prescribed/recommended today, ER and return-to-clinic precautions discussed, patient verbalized understanding.    Jaynee Eagles, [place] 07/31/19 1333

## 2019-07-31 NOTE — ED Triage Notes (Signed)
Pt presents to UC with chest pain, bilateral shoulder pain and back pain x 2 days. Pt denies SOB.

## 2019-07-31 NOTE — Discharge Instructions (Addendum)
You may take 500mg -650mg  Tylenol every 6 hours for neck pain and inflammation, aches and joint pains.

## 2019-08-02 LAB — NOVEL CORONAVIRUS, NAA (HOSP ORDER, SEND-OUT TO REF LAB; TAT 18-24 HRS): SARS-CoV-2, NAA: NOT DETECTED

## 2019-08-09 ENCOUNTER — Ambulatory Visit: Payer: Self-pay | Admitting: Cardiology

## 2019-08-16 ENCOUNTER — Ambulatory Visit: Payer: Self-pay | Admitting: Cardiology

## 2019-08-20 ENCOUNTER — Other Ambulatory Visit: Payer: Self-pay

## 2019-08-20 ENCOUNTER — Ambulatory Visit: Payer: Medicare HMO | Admitting: Cardiology

## 2019-08-20 ENCOUNTER — Encounter: Payer: Self-pay | Admitting: Cardiology

## 2019-08-20 VITALS — BP 154/89 | HR 83 | Temp 97.2°F | Resp 16 | Ht 67.0 in | Wt 245.0 lb

## 2019-08-20 DIAGNOSIS — I440 Atrioventricular block, first degree: Secondary | ICD-10-CM

## 2019-08-20 DIAGNOSIS — R0609 Other forms of dyspnea: Secondary | ICD-10-CM

## 2019-08-20 DIAGNOSIS — I10 Essential (primary) hypertension: Secondary | ICD-10-CM

## 2019-08-20 DIAGNOSIS — I5032 Chronic diastolic (congestive) heart failure: Secondary | ICD-10-CM

## 2019-08-20 DIAGNOSIS — R9431 Abnormal electrocardiogram [ECG] [EKG]: Secondary | ICD-10-CM

## 2019-08-20 MED ORDER — HYDROCHLOROTHIAZIDE 12.5 MG PO CAPS: 12.5000 mg | ORAL_CAPSULE | Freq: Every morning | ORAL | 0 refills | Status: DC

## 2019-08-20 NOTE — Patient Instructions (Addendum)
Please remember to bring in your medication bottles in at the next visit.   New Medications that were added at today's visit:  Lisinopril 20mg  po qpm HCTZ 12.5mg  po qAM   Medications that were discontinued at today's visit: None  Office will call you to have the following tests scheduled:  Echo Lexiscan   Please get labs done  at the nearest [place] and one week after you have started HCTZ.   Keep a log of your blood pressures and bring the log in to the next visit.   Recommend follow up with your PCP as scheduled.

## 2019-08-20 NOTE — Progress Notes (Signed)
REASON FOR CONSULT: Hypertension and Heart Failure  Chief Complaint  Patient presents with  . Hypertension  . New Patient (Initial Visit)    REQUESTING PHYSICIAN:  [name], MD [address] [place],  [place] [address]  HPI  Megan Ryan is a 84 y.o. female who presents to the office with a chief complaint of " history of blood pressure and evaluation for heart failure."  Patient's past medical history and cardiac risk factors include: Hypertension, first degree AV block, postmenopausal female, obesity, advanced age.  Patient denies any prior hospitalizations for heart failure but does carry a diagnosis of heart failure with preserved EF.  Benign essential hypertension: Patient states that she been diagnosed with high blood pressure for at least 1 year.  She is currently on Norvasc 5 mg p.o. daily.  The medication list that she brings in also notes that she is on Lasix 40 mg p.o. daily; however, patient states that she is no longer taking this medication.  She does not check her blood pressures at home.  Upon chart review it appears that her systolic blood pressures are currently not at goal.  No blood work available for review.  Review of her electronic medical records notes that she has been diagnosed with grade 1 diastolic impairment and carries a diagnosis of chronic diastolic heart failure.  However, patient states that she is never been hospitalized for congestive heart failure and is currently not on any medications for its management.  Does not have any chest pain at rest or with effort related activities.  However, she does have shortness of breath with effort related activities at times.  Patient states that when she has to go up a small hill to get her house she does get effort related dyspnea by the time she gets up the hill.  She waits for a few seconds and the symptoms go away.  She denies any orthopnea, paroxysmal nocturnal dyspnea.  She does have bilateral pitting  edema.  Review of systems positive for: Dyspnea on exertion, lower extremity swelling. Currently patient denies chest pain, lightheadedness, dizziness, palpitations, orthopnea, paroxysmal nocturnal dyspnea, near syncope, syncopal events, hematochezia, hemoptysis, hematemesis, melanotic stools, no symptoms of amaurosis fugax, motor or sensory symptoms or dysphasia in the last 6 months.   Denies prior history of coronary artery disease, myocardial infarction, congestive heart failure, deep venous thrombosis, pulmonary embolism, stroke, transient ischemic attack.  FUNCTIONAL STATUS: No structured exercise program or daily routine.  Patient ambulates with a cane.  ALLERGIES: No Known Allergies   MEDICATION LIST PRIOR TO VISIT: Current Outpatient Medications on File Prior to Visit  Medication Sig Dispense Refill  . amLODipine (NORVASC) 5 MG tablet Take 5 mg by mouth daily.    Marland Kitchen aspirin EC 81 MG tablet Take 81 mg by mouth daily.    Marland Kitchen dicyclomine (BENTYL) 20 MG tablet Take 1 tablet (20 mg total) by mouth 2 (two) times daily as needed for spasms. 10 tablet 0  . albuterol (PROVENTIL HFA;VENTOLIN HFA) 108 (90 Base) MCG/ACT inhaler Inhale 1-2 puffs into the lungs every 6 (six) hours as needed for wheezing or shortness of breath. (Patient not taking: Reported on 08/20/2019) 1 Inhaler 0  . famotidine (PEPCID) 20 MG tablet Take 1 tablet (20 mg total) by mouth at bedtime. (Patient not taking: Reported on 08/20/2019) 30 tablet 0  . furosemide (LASIX) 40 MG tablet Take 1 tablet (40 mg total) by mouth daily as needed for fluid or edema. (Patient not taking: Reported  on 08/20/2019) 30 tablet 0  . guaiFENesin (MUCINEX) 600 MG 12 hr tablet Take 1 tablet (600 mg total) by mouth 2 (two) times daily. (Patient not taking: Reported on 08/20/2019) 30 tablet 0  . linaclotide (LINZESS) 72 MCG capsule Take 72 mcg by mouth daily before breakfast.    . magnesium oxide (MAG-OX) 400 MG tablet Take 1 tablet (400 mg total) by mouth  daily. (Patient not taking: Reported on 08/20/2019) 30 tablet 0  . polyethylene glycol (MIRALAX / GLYCOLAX) packet Take 17 g by mouth daily as needed for mild constipation. (Patient not taking: Reported on 08/20/2019) 14 each 0  . potassium chloride SA (K-DUR,KLOR-CON) 20 MEQ tablet Take 1 tablet (20 mEq total) by mouth every Monday, Wednesday, and Friday. (Patient not taking: Reported on 08/20/2019) 30 tablet 0  . predniSONE (DELTASONE) 20 MG tablet Take 2 tablets daily with breakfast. (Patient not taking: Reported on 08/20/2019) 10 tablet 0  . propranolol ER (INDERAL LA) 60 MG 24 hr capsule Take 1 capsule (60 mg total) by mouth daily. (Patient not taking: Reported on 08/20/2019) 30 capsule 0   No current facility-administered medications on file prior to visit.    PAST MEDICAL HISTORY: Past Medical History:  Diagnosis Date  . Diabetes mellitus ([facility]) 06/27/2016  . Diastolic HF (heart failure) ([facility])   . Hypertension   Patient states that she has a history of borderline diabetes but currently is not on any medical therapy.  PAST SURGICAL HISTORY: Past Surgical History:  Procedure Laterality Date  . HERNIA REPAIR      FAMILY HISTORY: The patient family history includes Breast cancer in her mother; Diabetes in her brother, father, mother, and sister; Prostate cancer in her brother.   SOCIAL HISTORY:  The patient  reports that she has never smoked. She has never used smokeless tobacco. She reports that she does not drink alcohol or use drugs.  14 ORGAN REVIEW OF SYSTEMS: CONSTITUTIONAL: No fever or significant weight loss EYES: No recent significant visual change EARS, NOSE, MOUTH, THROAT: No recent significant change in hearing CARDIOVASCULAR: See discussion in subjective/HPI RESPIRATORY: See discussion in subjective/HPI GASTROINTESTINAL: No recent complaints of abdominal pain GENITOURINARY: No recent significant change in genitourinary status MUSCULOSKELETAL: No recent significant change  in musculoskeletal status INTEGUMENTARY: No recent rash NEUROLOGIC: No recent significant change in motor function PSYCHIATRIC: No recent significant change in mood ENDOCRINOLOGIC: No recent significant change in endocrine status HEMATOLOGIC/LYMPHATIC: No recent significant unexpected bruising ALLERGIC/IMMUNOLOGIC: No recent unexplained allergic reaction  PHYSICAL EXAM: Vitals with BMI 08/20/2019 08/20/2019 07/31/2019  Height - 5' 7"  -  Weight - 245 lbs -  BMI - 16.10 -  Systolic 960 454 098  Diastolic 89 119 81  Pulse 83 78 87   CONSTITUTIONAL: Well-developed and well-nourished. No acute distress.  SKIN: Skin is warm and dry. No rash noted. No cyanosis. No pallor. No jaundice HEAD: Normocephalic and atraumatic.  EYES: No scleral icterus MOUTH/THROAT: Moist oral membranes.  NECK: No JVD present. No thyromegaly noted. No carotid bruits  LYMPHATIC: No visible cervical adenopathy.  CHEST Normal respiratory effort. No intercostal retractions  LUNGS: Clear to auscultation bilaterally.  No stridor. No wheezes. No rales.  CARDIOVASCULAR: Regular rate and rhythm, positive J4-N8, soft systolic murmur heard at the left sternal border, no gallops or rubs. ABDOMINAL: Obese, soft, nontender, nondistended, positive bowel sounds in all 4 quadrants.  No apparent ascites.  EXTREMITIES: +1 bilateral peripheral edema  HEMATOLOGIC: No significant bruising NEUROLOGIC: Oriented to person, place, and time. Nonfocal.  Normal muscle tone.  PSYCHIATRIC: Normal mood and affect. Normal behavior. Cooperative  CARDIAC DATABASE: EKG: 08/20/2019: Normal sinus rhythm with a ventricular rate of 76 bpm, first-degree AV block, poor R wave progression, Q waves in the inferior leads suggestive of old inferior infarct, T wave inversions in the anteroseptal leads suggestive of possible ischemia.   Echocardiogram: 06/2016: LVEF 60-65%, moderate LVH, no regional wall motion abnormalities, grade 1 diastolic impairment, aortic  valve sclerosis without stenosis, left atrium mild to moderately dilated, right atrium mildly dilated.  Stress Testing: None  Heart Catheterization: None  LABORATORY DATA: CBC Latest Ref Rng & Units 12/27/2016 07/02/2016 07/01/2016  WBC 4.0 - 10.5 K/uL 4.5 5.1 6.6  Hemoglobin 12.0 - 15.0 g/dL 12.2 9.8(L) 9.8(L)  Hematocrit 36.0 - 46.0 % 36.5 29.9(L) 29.7(L)  Platelets 150 - 400 K/uL 200 307 267    CMP Latest Ref Rng & Units 12/27/2016 07/02/2016 07/01/2016  Glucose 65 - 99 mg/dL 127(H) 88 93  BUN 6 - 20 mg/dL 16 17 17   Creatinine 0.44 - 1.00 mg/dL 0.74 0.55 0.71  Sodium 135 - 145 mmol/L 134(L) 140 141  Potassium 3.5 - 5.1 mmol/L 3.5 3.7 3.1(L)  Chloride 101 - 111 mmol/L 102 105 104  CO2 22 - 32 mmol/L 26 27 29   Calcium 8.9 - 10.3 mg/dL 9.4 8.5(L) 8.5(L)  Total Protein 6.5 - 8.1 g/dL 8.9(H) - -  Total Bilirubin 0.3 - 1.2 mg/dL 0.5 - -  Alkaline Phos 38 - 126 U/L 77 - -  AST 15 - 41 U/L 22 - -  ALT 14 - 54 U/L 20 - -    Lipid Panel  No results found for: CHOL, TRIG, HDL, CHOLHDL, VLDL, LDLCALC, LDLDIRECT, LABVLDL  Lab Results  Component Value Date   HGBA1C 6.2 (H) 06/28/2016   No components found for: NTPROBNP Lab Results  Component Value Date   TSH 1.111 06/28/2016   No recent blood work available for review.  FINAL MEDICATION LIST END OF ENCOUNTER: Meds ordered this encounter  Medications  . hydrochlorothiazide (MICROZIDE) 12.5 MG capsule    Sig: Take 1 capsule (12.5 mg total) by mouth every morning.    Dispense:  30 capsule    Refill:  0    There are no discontinued medications.   Current Outpatient Medications:  .  amLODipine (NORVASC) 5 MG tablet, Take 5 mg by mouth daily., Disp: , Rfl:  .  aspirin EC 81 MG tablet, Take 81 mg by mouth daily., Disp: , Rfl:  .  dicyclomine (BENTYL) 20 MG tablet, Take 1 tablet (20 mg total) by mouth 2 (two) times daily as needed for spasms., Disp: 10 tablet, Rfl: 0 .  albuterol (PROVENTIL HFA;VENTOLIN HFA) 108 (90 Base) MCG/ACT inhaler,  Inhale 1-2 puffs into the lungs every 6 (six) hours as needed for wheezing or shortness of breath. (Patient not taking: Reported on 08/20/2019), Disp: 1 Inhaler, Rfl: 0 .  famotidine (PEPCID) 20 MG tablet, Take 1 tablet (20 mg total) by mouth at bedtime. (Patient not taking: Reported on 08/20/2019), Disp: 30 tablet, Rfl: 0 .  furosemide (LASIX) 40 MG tablet, Take 1 tablet (40 mg total) by mouth daily as needed for fluid or edema. (Patient not taking: Reported on 08/20/2019), Disp: 30 tablet, Rfl: 0 .  guaiFENesin (MUCINEX) 600 MG 12 hr tablet, Take 1 tablet (600 mg total) by mouth 2 (two) times daily. (Patient not taking: Reported on 08/20/2019), Disp: 30 tablet, Rfl: 0 .  hydrochlorothiazide (MICROZIDE) 12.5 MG capsule, Take  1 capsule (12.5 mg total) by mouth every morning., Disp: 30 capsule, Rfl: 0 .  linaclotide (LINZESS) 72 MCG capsule, Take 72 mcg by mouth daily before breakfast., Disp: , Rfl:  .  magnesium oxide (MAG-OX) 400 MG tablet, Take 1 tablet (400 mg total) by mouth daily. (Patient not taking: Reported on 08/20/2019), Disp: 30 tablet, Rfl: 0 .  polyethylene glycol (MIRALAX / GLYCOLAX) packet, Take 17 g by mouth daily as needed for mild constipation. (Patient not taking: Reported on 08/20/2019), Disp: 14 each, Rfl: 0 .  potassium chloride SA (K-DUR,KLOR-CON) 20 MEQ tablet, Take 1 tablet (20 mEq total) by mouth every Monday, Wednesday, and Friday. (Patient not taking: Reported on 08/20/2019), Disp: 30 tablet, Rfl: 0 .  predniSONE (DELTASONE) 20 MG tablet, Take 2 tablets daily with breakfast. (Patient not taking: Reported on 08/20/2019), Disp: 10 tablet, Rfl: 0 .  propranolol ER (INDERAL LA) 60 MG 24 hr capsule, Take 1 capsule (60 mg total) by mouth daily. (Patient not taking: Reported on 08/20/2019), Disp: 30 capsule, Rfl: 0  IMPRESSION:    ICD-10-CM   1. Essential hypertension  I10 EKG 12-Lead    PCV ECHOCARDIOGRAM COMPLETE    CMP14+EGFR    Magnesium    hydrochlorothiazide (MICROZIDE) 12.5 MG  capsule    Basic Metabolic Panel (BMET)    Magnesium  2. Stage B heart failure with preserved EF  I50.32 PCV MYOCARDIAL PERFUSION WITH LEXISCAN    HgB A1c    Lipid Panel With LDL/HDL Ratio  3. Dyspnea on exertion  R06.00 PCV ECHOCARDIOGRAM COMPLETE    PCV MYOCARDIAL PERFUSION WITH LEXISCAN    Pro b natriuretic peptide (BNP)[facility])    Lipid Panel With LDL/HDL Ratio  4. Abnormal EKG  R94.31 PCV MYOCARDIAL PERFUSION WITH LEXISCAN  5. First degree AV block  I44.0      RECOMMENDATIONS: Benign essential hypertension:  Blood pressure currently not at goal.  Patient is asked his a blood pressure cuff/sphygmomanometer and to keep a blood pressure log so that they can be reviewed at the next office visit and medications can further be titrated if needed.  We will check a BMP, magnesium at baseline.  Repeat blood work in 1 week status initiation of hydrochlorothiazide 12.5 mg p.o. every morning.  Patient is asked to call the office if her systolic blood pressures are consistently greater than 140 mmHg for further medication titrations prior to the next visit.  Low-salt diet recommended.  Stage B heart failure with preserved EF:  It appears that patient had an echocardiogram back in 2018 and was diagnosed with grade 1 diastolic impairment.  She denies any prior hospitalizations for congestive heart failure.  She is currently not on guideline directed medical therapy.  Would also recommend screening her for underlying risk factors such as hyperlipidemia and diabetes mellitus.  Check fasting lipid profile.  We will check hemoglobin A1c.  Discussed changing her Norvasc to another antihypertensive medications given the lower extremity swelling; however, patient would like to continue her Norvasc 5 mg p.o. daily and will add hydrochlorothiazide for now.   Anticipate that her medication regimen may change based on her pending laboratory values and diagnostic  testing.  Dyspnea on exertion:  EKG performed, interpreted as noted above.  Plan echocardiogram to evaluate for structural heart disease and LV function.    Plan nuclear stress test to evaluate for underlying ischemic burden.  Abnormal EKG:  Normal sinus rhythm with findings suggestive of old inferior infarct and T wave inversions  in the anterior septal leads suggestive of possible ischemia.  Plan nuclear stress test to evaluate for underlying ischemic bleeding.  First-degree AV block: Asymptomatic: Continue to monitor  Obesity, due to excess calories Body mass index is 38.37 kg/m.  Lifestyle modifications recommended and discussed extensively at today's office visit.  Orders Placed This Encounter  Procedures  . CMP14+EGFR  . Magnesium  . HgB A1c  . Pro b natriuretic peptide (BNP)[facility])  . Basic Metabolic Panel (BMET)  . Magnesium  . Lipid Panel With LDL/HDL Ratio  . PCV MYOCARDIAL PERFUSION WITH LEXISCAN  . EKG 12-Lead  . PCV ECHOCARDIOGRAM COMPLETE   --Continue cardiac medications as reconciled in final medication list. --Return in about 4 weeks (around 09/17/2019) for Discussion of test results.. Or sooner if needed. --Continue follow-up with your primary care physician regarding the management of your other chronic comorbid conditions.  Patient's questions and concerns were addressed to her satisfaction. She voices understanding of the instructions provided during this encounter.   This note was created using a voice recognition software as a result there may be grammatical errors inadvertently enclosed that do not reflect the nature of this encounter. Every attempt is made to correct such errors.  Rex Kras, DO, [facility]. [place] Office: [phone]

## 2019-08-21 ENCOUNTER — Other Ambulatory Visit: Payer: Self-pay | Admitting: Cardiology

## 2019-08-21 DIAGNOSIS — I10 Essential (primary) hypertension: Secondary | ICD-10-CM

## 2019-08-21 DIAGNOSIS — R0609 Other forms of dyspnea: Secondary | ICD-10-CM

## 2019-08-21 DIAGNOSIS — I5032 Chronic diastolic (congestive) heart failure: Secondary | ICD-10-CM

## 2019-08-29 ENCOUNTER — Other Ambulatory Visit: Payer: Self-pay

## 2019-08-29 ENCOUNTER — Ambulatory Visit: Payer: Medicare HMO

## 2019-08-29 DIAGNOSIS — R0609 Other forms of dyspnea: Secondary | ICD-10-CM

## 2019-08-29 DIAGNOSIS — I10 Essential (primary) hypertension: Secondary | ICD-10-CM

## 2019-09-02 ENCOUNTER — Other Ambulatory Visit: Payer: Self-pay

## 2019-09-02 ENCOUNTER — Ambulatory Visit: Payer: Medicare HMO

## 2019-09-02 DIAGNOSIS — R0609 Other forms of dyspnea: Secondary | ICD-10-CM

## 2019-09-02 DIAGNOSIS — R9431 Abnormal electrocardiogram [ECG] [EKG]: Secondary | ICD-10-CM

## 2019-09-02 DIAGNOSIS — I5032 Chronic diastolic (congestive) heart failure: Secondary | ICD-10-CM

## 2019-09-18 ENCOUNTER — Ambulatory Visit: Payer: Medicare HMO | Admitting: Cardiology

## 2019-09-24 ENCOUNTER — Ambulatory Visit: Payer: Medicare HMO | Admitting: Cardiology

## 2019-09-26 ENCOUNTER — Encounter: Payer: Self-pay | Admitting: Cardiology

## 2019-09-26 ENCOUNTER — Ambulatory Visit: Payer: Medicare HMO | Admitting: Cardiology

## 2019-09-26 ENCOUNTER — Other Ambulatory Visit: Payer: Self-pay

## 2019-09-26 VITALS — BP 143/66 | HR 79 | Temp 96.4°F | Ht 67.0 in | Wt 242.0 lb

## 2019-09-26 DIAGNOSIS — I10 Essential (primary) hypertension: Secondary | ICD-10-CM

## 2019-09-26 DIAGNOSIS — Z6837 Body mass index (BMI) 37.0-37.9, adult: Secondary | ICD-10-CM

## 2019-09-26 DIAGNOSIS — R0609 Other forms of dyspnea: Secondary | ICD-10-CM

## 2019-09-26 DIAGNOSIS — I440 Atrioventricular block, first degree: Secondary | ICD-10-CM

## 2019-09-26 DIAGNOSIS — E6609 Other obesity due to excess calories: Secondary | ICD-10-CM

## 2019-09-26 MED ORDER — HYDROCHLOROTHIAZIDE 12.5 MG PO CAPS: 12.5000 mg | ORAL_CAPSULE | Freq: Every morning | ORAL | 0 refills | Status: DC

## 2019-09-26 NOTE — Progress Notes (Signed)
Chief Complaint  Patient presents with  . Hypertension  . Follow-up    4 week and test results   HPI  Megan Ryan is a 84 y.o. female who presents to the office with a chief complaint of " high blood pressure and follow-up on test results."  Patient's past medical history and cardiac risk factors include: Hypertension, first degree AV block, postmenopausal female, obesity, advanced age.  Benign essential hypertension: Patient states that she been diagnosed with high blood pressure for at least 1 year.  She is currently on Norvasc 5 mg p.o. daily.  She was started on hydrochlorothiazide at last office visit.  She is tolerating the medication well without any side effects or intolerances.  She forgot to get blood work after starting diuretic therapy to reevaluate kidney function and electrolytes.  Patient states that she is been keeping a log of her blood pressures but forgot to bring them in at today's office visit.  Patient blood pressure has improved at today's office visit as compared to her prior readings as well.   Review of her electronic medical records notes that she has been diagnosed with grade 1 diastolic impairment and carries a diagnosis of chronic diastolic heart failure.  However, patient states that she is never been hospitalized for congestive heart failure and is currently not on any medications for its management.  Does not have any chest pain at rest or with effort related activities.  However, she does have shortness of breath with effort related activities at times when she overexerts herself.  Patient states that when she has to go up a small hill or take up groceries a flight of stairs she notes effort related dyspnea.  This is been chronic and stable for several years.  Patient had repeat echocardiogram which notes a preserved left ventricular systolic function without any significant valvular abnormalities and normal diastolic function.  Review of systems positive  for: Dyspnea on exertion (chronic and stable). Currently patient denies chest pain, lightheadedness, dizziness, lower extremity swelling, palpitations, orthopnea, paroxysmal nocturnal dyspnea, near syncope, syncopal events, hematochezia, hemoptysis, hematemesis, melanotic stools, no symptoms of amaurosis fugax, motor or sensory symptoms or dysphasia in the last 6 months.   Denies prior history of coronary artery disease, myocardial infarction, congestive heart failure, deep venous thrombosis, pulmonary embolism, stroke, transient ischemic attack.  FUNCTIONAL STATUS: No structured exercise program or daily routine.  Patient ambulates with a cane.  ALLERGIES: No Known Allergies   MEDICATION LIST PRIOR TO VISIT: Current Outpatient Medications on File Prior to Visit  Medication Sig Dispense Refill  . amLODipine (NORVASC) 5 MG tablet Take 5 mg by mouth daily.    Marland Kitchen aspirin EC 81 MG tablet Take 81 mg by mouth daily.    Marland Kitchen linaclotide (LINZESS) 72 MCG capsule Take 72 mcg by mouth daily before breakfast.    . predniSONE (DELTASONE) 20 MG tablet Take 2 tablets daily with breakfast. 10 tablet 0   No current facility-administered medications on file prior to visit.    PAST MEDICAL HISTORY: Past Medical History:  Diagnosis Date  . Diabetes mellitus ([place]) 06/27/2016  . Diastolic HF (heart failure) ([place])   . Hypertension   Patient states that she has a history of borderline diabetes but currently is not on any medical therapy.  PAST SURGICAL HISTORY: Past Surgical History:  Procedure Laterality Date  . HERNIA REPAIR      FAMILY HISTORY: The patient family history includes Breast cancer in her mother; Diabetes  in her brother, father, mother, and sister; Prostate cancer in her brother.   SOCIAL HISTORY:  The patient  reports that she has never smoked. She has never used smokeless tobacco. She reports that she does not drink alcohol or use drugs.  14 ORGAN REVIEW OF SYSTEMS: CONSTITUTIONAL: No  fever or significant weight loss EYES: No recent significant visual change EARS, NOSE, MOUTH, THROAT: No recent significant change in hearing CARDIOVASCULAR: See discussion in subjective/HPI RESPIRATORY: See discussion in subjective/HPI GASTROINTESTINAL: No recent complaints of abdominal pain GENITOURINARY: No recent significant change in genitourinary status MUSCULOSKELETAL: No recent significant change in musculoskeletal status INTEGUMENTARY: No recent rash NEUROLOGIC: No recent significant change in motor function PSYCHIATRIC: No recent significant change in mood ENDOCRINOLOGIC: No recent significant change in endocrine status HEMATOLOGIC/LYMPHATIC: No recent significant unexpected bruising ALLERGIC/IMMUNOLOGIC: No recent unexplained allergic reaction  PHYSICAL EXAM: Vitals with BMI 09/26/2019 08/20/2019 08/20/2019  Height 5\' 7"  - 5\' 7"   Weight 242 lbs - 245 lbs  BMI 123456 - 99991111  Systolic [id number] 0000000  Diastolic 66 89 [id number]  Pulse 79 83 78   CONSTITUTIONAL: Well-developed and well-nourished. No acute distress.  SKIN: Skin is warm and dry. No rash noted. No cyanosis. No pallor. No jaundice HEAD: Normocephalic and atraumatic.  EYES: No scleral icterus MOUTH/THROAT: Moist oral membranes.  NECK: No JVD present. No thyromegaly noted. No carotid bruits  LYMPHATIC: No visible cervical adenopathy.  CHEST Normal respiratory effort. No intercostal retractions  LUNGS: Clear to auscultation bilaterally.  No stridor. No wheezes. No rales.  CARDIOVASCULAR: Regular rate and rhythm, positive [id number], soft systolic murmur heard at the left sternal border, no gallops or rubs. ABDOMINAL: Obese, soft, nontender, nondistended, positive bowel sounds in all 4 quadrants.  No apparent ascites.  EXTREMITIES: Trace bilateral peripheral edema  HEMATOLOGIC: No significant bruising NEUROLOGIC: Oriented to person, place, and time. Nonfocal. Normal muscle tone.  PSYCHIATRIC: Normal mood and affect. Normal  behavior. Cooperative  CARDIAC DATABASE: EKG: 08/20/2019: Normal sinus rhythm with a ventricular rate of 76 bpm, first-degree AV block, poor R wave progression, Q waves in the inferior leads suggestive of old inferior infarct, T wave inversions in the anteroseptal leads suggestive of possible ischemia.   Echocardiogram: 06/2016: LVEF 60-65%, moderate LVH, no regional wall motion abnormalities, grade 1 diastolic impairment, aortic valve sclerosis without stenosis, left atrium mild to moderately dilated, right atrium mildly dilated.  08/29/2019: LVEF 50-55%, no regional wall motion abnormalities, normal diastolic filling pattern, normal left atrial pressure, mild LVH, mild MR, mild TR, mild PR, small circumferential pericardial effusion with no hemodynamic compromise.   Stress Testing: Lexiscan Tetrofosmin Stress Test  09/02/2019: Nondiagnostic ECG stress. Daphragmatic attenuation noted. Myocardial perfusion is normal. Stress LV EF: 75%.  No previous exam available for comparison. Low risk study.   Heart Catheterization: None  LABORATORY DATA: CBC Latest Ref Rng & Units 12/27/2016 07/02/2016 07/01/2016  WBC 4.0 - 10.5 K/uL 4.5 5.1 6.6  Hemoglobin 12.0 - 15.0 g/dL 12.2 9.8(L) 9.8(L)  Hematocrit 36.0 - 46.0 % 36.5 29.9(L) 29.7(L)  Platelets 150 - 400 K/uL 200 307 267    CMP Latest Ref Rng & Units 12/27/2016 07/02/2016 07/01/2016  Glucose 65 - 99 mg/dL 127(H) 88 93  BUN 6 - 20 mg/dL 16 17 17   Creatinine 0.44 - 1.00 mg/dL 0.74 0.55 0.71  Sodium 135 - 145 mmol/L 134(L) 140 141  Potassium 3.5 - 5.1 mmol/L 3.5 3.7 3.1(L)  Chloride 101 - 111 mmol/L 102 105 104  CO2 22 - 32  mmol/L 26 27 29   Calcium 8.9 - 10.3 mg/dL 9.4 8.5(L) 8.5(L)  Total Protein 6.5 - 8.1 g/dL 8.9(H) - -  Total Bilirubin 0.3 - 1.2 mg/dL 0.5 - -  Alkaline Phos 38 - 126 U/L 77 - -  AST 15 - 41 U/L 22 - -  ALT 14 - 54 U/L 20 - -    Lipid Panel  No results found for: CHOL, TRIG, HDL, CHOLHDL, VLDL, LDLCALC, LDLDIRECT,  LABVLDL  Lab Results  Component Value Date   HGBA1C 6.2 (H) 06/28/2016   No components found for: NTPROBNP Lab Results  Component Value Date   TSH 1.111 06/28/2016   No recent blood work available for review.  FINAL MEDICATION LIST END OF ENCOUNTER: Meds ordered this encounter  Medications  . hydrochlorothiazide (MICROZIDE) 12.5 MG capsule    Sig: Take 1 capsule (12.5 mg total) by mouth every morning.    Dispense:  30 capsule    Refill:  0    Medications Discontinued During This Encounter  Medication Reason  . albuterol (PROVENTIL HFA;VENTOLIN HFA) 108 (90 Base) MCG/ACT inhaler Completed Course  . famotidine (PEPCID) 20 MG tablet Completed Course  . guaiFENesin (MUCINEX) 600 MG 12 hr tablet Patient Preference  . propranolol ER (INDERAL LA) 60 MG 24 hr capsule Patient Preference  . potassium chloride SA (K-DUR,KLOR-CON) 20 MEQ tablet Patient Preference  . polyethylene glycol (MIRALAX / GLYCOLAX) packet Patient Preference  . magnesium oxide (MAG-OX) 400 MG tablet Patient Preference  . furosemide (LASIX) 40 MG tablet Patient Preference  . dicyclomine (BENTYL) 20 MG tablet Patient Preference  . hydrochlorothiazide (MICROZIDE) 12.5 MG capsule Reorder     Current Outpatient Medications:  .  amLODipine (NORVASC) 5 MG tablet, Take 5 mg by mouth daily., Disp: , Rfl:  .  aspirin EC 81 MG tablet, Take 81 mg by mouth daily., Disp: , Rfl:  .  linaclotide (LINZESS) 72 MCG capsule, Take 72 mcg by mouth daily before breakfast., Disp: , Rfl:  .  predniSONE (DELTASONE) 20 MG tablet, Take 2 tablets daily with breakfast., Disp: 10 tablet, Rfl: 0 .  hydrochlorothiazide (MICROZIDE) 12.5 MG capsule, Take 1 capsule (12.5 mg total) by mouth every morning., Disp: 30 capsule, Rfl: 0  IMPRESSION:    ICD-10-CM   1. Dyspnea on exertion  R06.00 Lipid Panel With LDL/HDL Ratio    HgB A1c  2. Essential hypertension  99991111 Basic Metabolic Panel (BMET)    Magnesium    hydrochlorothiazide (MICROZIDE) 12.5  MG capsule  3. First degree AV block  I44.0      RECOMMENDATIONS: Benign essential hypertension: Improving  Office blood pressure are improving.  Patient does have a blood pressure log at home; however, she forgot to bring it in today's office visit.  She will bring it on Monday and drop it off.  She forgot to get her repeat blood work 1 week after initiating hydrochlorothiazide.  We will check BMP and magnesium level.  If her kidney function electrolytes are within normal limits would recommend increasing her hydrochlorothiazide to 25 mg p.o. every morning.  And also transition her from amlodipine to another antihypertensive medications given her lower extremities swelling which could be contributory from her current Norvasc.  No medication changes were done during today's encounter as her blood work is currently pending.  Refill hydrochlorothiazide 12.5 mg p.o. daily.  Patient states that she ran out of medications approximately 2 days prior to today's visit.  And that could be the reason why her office  blood pressures are running a little bit higher.  Low-salt diet recommended.  Dyspnea on exertion:  EKG performed, interpreted as noted above.  Given the symptoms of effort related dyspnea, and multiple cardiovascular risk factors patient underwent an echo and a nuclear stress test since last visit.  Echocardiogram results reviewed with the patient and noted above for further reference.  Nuclear stress test does not report evidence of reversible ischemia and overall noted to be a low risk study.  Results discussed with the patient at today's office visit.    Patient states that she does not have any follow-up set up with her PCP.  Therefore we will screen her for hyperlipidemia and diabetes mellitus.  First-degree AV block: Asymptomatic: Continue to monitor  Obesity, due to excess calories: Body mass index is 37.9 kg/m. I reviewed with the patient the importance of diet,  regular physical activity/exercise, weight loss.   Patient is educated on increasing physical activity gradually as tolerated.  With the goal of moderate intensity exercise for 30 minutes a day 5 days a week.  Orders Placed This Encounter  Procedures  . Basic Metabolic Panel (BMET)  . Magnesium  . Lipid Panel With LDL/HDL Ratio  . HgB A1c   --Continue cardiac medications as reconciled in final medication list. --Return in about 6 months (around 03/27/2020) for BP, LE swelling. .. Or sooner if needed. --Continue follow-up with your primary care physician regarding the management of your other chronic comorbid conditions.  Patient's questions and concerns were addressed to her satisfaction. She voices understanding of the instructions provided during this encounter.   This note was created using a voice recognition software as a result there may be grammatical errors inadvertently enclosed that do not reflect the nature of this encounter. Every attempt is made to correct such errors.  Rex Kras, DO, [facility]. [place] Office: [phone]

## 2019-09-26 NOTE — Patient Instructions (Addendum)
Please have your blood work checked on Monday, September 30, 2019. Be fasting.   Drop off your blood pressure readings.

## 2019-09-30 ENCOUNTER — Telehealth: Payer: Self-pay

## 2019-10-01 ENCOUNTER — Telehealth: Payer: Self-pay

## 2019-10-01 LAB — CMP14+EGFR
ALT: 11 IU/L (ref 0–32)
AST: 11 IU/L (ref 0–40)
Albumin/Globulin Ratio: 0.9 — ABNORMAL LOW (ref 1.2–2.2)
Albumin: 3.8 g/dL (ref 3.6–4.6)
Alkaline Phosphatase: 102 IU/L (ref 39–117)
BUN/Creatinine Ratio: 20 (ref 12–28)
BUN: 14 mg/dL (ref 8–27)
Bilirubin Total: 0.4 mg/dL (ref 0.0–1.2)
CO2: 24 mmol/L (ref 20–29)
Calcium: 9.5 mg/dL (ref 8.7–10.3)
Chloride: 102 mmol/L (ref 96–106)
Creatinine, Ser: 0.7 mg/dL (ref 0.57–1.00)
GFR calc Af Amer: 93 mL/min/{1.73_m2} (ref >59)
GFR calc non Af Amer: 81 mL/min/{1.73_m2} (ref >59)
Globulin, Total: 4.1 g/dL (ref 1.5–4.5)
Glucose: 89 mg/dL (ref 65–99)
Potassium: 3.7 mmol/L (ref 3.5–5.2)
Sodium: 142 mmol/L (ref 134–144)
Total Protein: 7.9 g/dL (ref 6.0–8.5)

## 2019-10-01 LAB — HEMOGLOBIN A1C
Est. average glucose Bld gHb Est-mCnc: 137 mg/dL
Hgb A1c MFr Bld: 6.4 % — ABNORMAL HIGH (ref 4.8–5.6)

## 2019-10-01 LAB — PRO B NATRIURETIC PEPTIDE: NT-Pro BNP: 60 pg/mL (ref 0–738)

## 2019-10-01 LAB — LIPID PANEL WITH LDL/HDL RATIO
Cholesterol, Total: 151 mg/dL (ref 100–199)
HDL: 42 mg/dL (ref >39)
LDL Chol Calc (NIH): 97 mg/dL (ref 0–99)
LDL/HDL Ratio: 2.3 ratio (ref 0.0–3.2)
Triglycerides: 59 mg/dL (ref 0–149)
VLDL Cholesterol Cal: 12 mg/dL (ref 5–40)

## 2019-10-01 LAB — MAGNESIUM: Magnesium: 2.2 mg/dL (ref 1.6–2.3)

## 2019-10-01 NOTE — Telephone Encounter (Signed)
Spoke with patient regarding her blood pressure log. Patient states that she has only checked her bp during the days included in the log she provided to [place]. Patient stated that blood pressures are checked "some mornings and some nights." I encouraged patient to check blood pressures around the same time everyday and to limit salt intake. Also confirmed with patient as to how to properly take a bp. Patient is taking her medications as prescribed. Patient voiced understanding. Please advise. Thanks!

## 2019-10-01 NOTE — Telephone Encounter (Signed)
Error

## 2019-10-02 ENCOUNTER — Telehealth: Payer: Self-pay

## 2019-10-02 NOTE — Telephone Encounter (Signed)
Attempted to call patient in regards to pt lab results. No answer, unable to leave vm. Will try back at later time.

## 2019-10-02 NOTE — Telephone Encounter (Signed)
Please let me know when she submits another BP log for review.

## 2019-10-02 NOTE — Telephone Encounter (Signed)
-----   Message from [place], [place] sent at 10/02/2019  7:44 AM EDT ----- Labs reviewed.  A1c suggestive of prediabetes please talk with your primary care doc.

## 2019-10-04 NOTE — Progress Notes (Signed)
Relayed information to patient. Patient voiced understanding.  

## 2019-10-23 ENCOUNTER — Other Ambulatory Visit: Payer: Self-pay | Admitting: Cardiology

## 2019-10-23 DIAGNOSIS — I10 Essential (primary) hypertension: Secondary | ICD-10-CM

## 2019-10-25 ENCOUNTER — Other Ambulatory Visit: Payer: Self-pay

## 2019-10-25 DIAGNOSIS — R0609 Other forms of dyspnea: Secondary | ICD-10-CM

## 2019-10-25 DIAGNOSIS — I10 Essential (primary) hypertension: Secondary | ICD-10-CM

## 2019-10-31 ENCOUNTER — Emergency Department (HOSPITAL_COMMUNITY): Payer: Medicare HMO

## 2019-10-31 ENCOUNTER — Other Ambulatory Visit: Payer: Self-pay

## 2019-10-31 ENCOUNTER — Encounter (HOSPITAL_COMMUNITY): Payer: Self-pay | Admitting: Emergency Medicine

## 2019-10-31 ENCOUNTER — Emergency Department (HOSPITAL_COMMUNITY)
Admission: EM | Admit: 2019-10-31 | Discharge: 2019-10-31 | Disposition: A | Discharge status: Home / Self Care | Payer: Medicare HMO | Attending: Emergency Medicine | Admitting: Emergency Medicine

## 2019-10-31 ENCOUNTER — Encounter (HOSPITAL_COMMUNITY): Payer: Self-pay

## 2019-10-31 ENCOUNTER — Ambulatory Visit (HOSPITAL_COMMUNITY)
Admission: EM | Admit: 2019-10-31 | Discharge: 2019-10-31 | Disposition: A | Discharge status: Home / Self Care | Payer: Medicare HMO | Source: Home / Self Care

## 2019-10-31 DIAGNOSIS — Z79899 Other long term (current) drug therapy: Secondary | ICD-10-CM | POA: Diagnosis not present

## 2019-10-31 DIAGNOSIS — R109 Unspecified abdominal pain: Secondary | ICD-10-CM

## 2019-10-31 DIAGNOSIS — R1084 Generalized abdominal pain: Secondary | ICD-10-CM | POA: Insufficient documentation

## 2019-10-31 DIAGNOSIS — I5032 Chronic diastolic (congestive) heart failure: Secondary | ICD-10-CM | POA: Diagnosis not present

## 2019-10-31 DIAGNOSIS — E119 Type 2 diabetes mellitus without complications: Secondary | ICD-10-CM | POA: Diagnosis not present

## 2019-10-31 DIAGNOSIS — R0789 Other chest pain: Secondary | ICD-10-CM

## 2019-10-31 DIAGNOSIS — Z7982 Long term (current) use of aspirin: Secondary | ICD-10-CM | POA: Diagnosis not present

## 2019-10-31 DIAGNOSIS — I110 Hypertensive heart disease with heart failure: Secondary | ICD-10-CM | POA: Diagnosis not present

## 2019-10-31 LAB — BASIC METABOLIC PANEL
Anion gap: 11 (ref 5–15)
BUN: 19 mg/dL (ref 8–23)
CO2: 27 mmol/L (ref 22–32)
Calcium: 9.6 mg/dL (ref 8.9–10.3)
Chloride: 99 mmol/L (ref 98–111)
Creatinine, Ser: 0.75 mg/dL (ref 0.44–1.00)
GFR calc Af Amer: >60 mL/min (ref >60)
GFR calc non Af Amer: >60 mL/min (ref >60)
Glucose, Bld: 87 mg/dL (ref 70–99)
Potassium: 3.3 mmol/L — ABNORMAL LOW (ref 3.5–5.1)
Sodium: 137 mmol/L (ref 135–145)

## 2019-10-31 LAB — CBC
HCT: 38.2 % (ref 36.0–46.0)
Hemoglobin: 12 g/dL (ref 12.0–15.0)
MCH: 30.4 pg (ref 26.0–34.0)
MCHC: 31.4 g/dL (ref 30.0–36.0)
MCV: 96.7 fL (ref 80.0–100.0)
Platelets: 232 10*3/uL (ref 150–400)
RBC: 3.95 MIL/uL (ref 3.87–5.11)
RDW: 14.1 % (ref 11.5–15.5)
WBC: 4.2 10*3/uL (ref 4.0–10.5)
nRBC: 0 % (ref 0.0–0.2)

## 2019-10-31 LAB — TROPONIN I (HIGH SENSITIVITY)
Troponin I (High Sensitivity): 5 ng/L (ref <18)
Troponin I (High Sensitivity): 4 ng/L (ref <18)

## 2019-10-31 LAB — HEPATIC FUNCTION PANEL
ALT: 21 U/L (ref 0–44)
AST: 27 U/L (ref 15–41)
Albumin: 3.3 g/dL — ABNORMAL LOW (ref 3.5–5.0)
Alkaline Phosphatase: 68 U/L (ref 38–126)
Bilirubin, Direct: 0.3 mg/dL — ABNORMAL HIGH (ref 0.0–0.2)
Indirect Bilirubin: 0.6 mg/dL (ref 0.3–0.9)
Total Bilirubin: 0.9 mg/dL (ref 0.3–1.2)
Total Protein: 8.5 g/dL — ABNORMAL HIGH (ref 6.5–8.1)

## 2019-10-31 LAB — LIPASE, BLOOD: Lipase: 22 U/L (ref 11–51)

## 2019-10-31 MED ORDER — ACETAMINOPHEN 325 MG PO TABS
650.0000 mg | ORAL_TABLET | Freq: Once | ORAL | Status: AC
  Administered 2019-10-31: 17:00:00 650 mg via ORAL
  Filled 2019-10-31: qty 2

## 2019-10-31 MED ORDER — SODIUM CHLORIDE 0.9% FLUSH
3.0000 mL | Freq: Once | INTRAVENOUS | Status: AC
  Administered 2019-10-31: 19:00:00 3 mL via INTRAVENOUS

## 2019-10-31 MED ORDER — IOHEXOL 300 MG/ML  SOLN
100.0000 mL | Freq: Once | INTRAMUSCULAR | Status: AC | PRN
  Administered 2019-10-31: 20:00:00 100 mL via INTRAVENOUS

## 2019-10-31 MED ORDER — SODIUM CHLORIDE 0.9 % IV BOLUS
500.0000 mL | Freq: Once | INTRAVENOUS | Status: AC
  Administered 2019-10-31: 19:00:00 500 mL via INTRAVENOUS

## 2019-10-31 MED ORDER — LIDOCAINE 5 % EX PTCH
1.0000 | MEDICATED_PATCH | CUTANEOUS | Status: DC
  Administered 2019-10-31: 17:00:00 1 via TRANSDERMAL
  Filled 2019-10-31: qty 1

## 2019-10-31 NOTE — ED Notes (Signed)
Pt transported to CT ?

## 2019-10-31 NOTE — ED Triage Notes (Signed)
Pt c/o acute onset pain under left breast and arm approx 3 days ago. Pt reports pain to central chest radiating to back, SOBOE observed. Denies diaphoresis, n/v, or dizziness with CP.   Denies recent weight gain or increased BLE edema.

## 2019-10-31 NOTE — ED Provider Notes (Signed)
[facility] EMERGENCY DEPARTMENT Provider Note   CSN: [id number] Arrival date & time: 10/31/19  1223     History Chief Complaint  Patient presents with  . Chest Pain  . Abdominal Pain    Megan Ryan is a 84 y.o. female.  The history is provided by the patient.  Abdominal Pain Pain location:  Generalized Pain quality: aching   Pain radiates to:  Chest Pain severity:  Mild Onset quality:  Gradual Duration:  3 days Timing:  Constant Progression:  Unchanged Chronicity:  New Context: laxative use (constipated)   Relieved by:  Nothing Worsened by:  Nothing Ineffective treatments: laxatives. Associated symptoms: chest pain and constipation   Associated symptoms: no anorexia, no belching, no chills, no cough, no dysuria, no fatigue, no fever, no hematuria, no nausea, no shortness of breath, no sore throat and no vomiting   Risk factors: multiple surgeries        Past Medical History:  Diagnosis Date  . Diabetes mellitus ([place]) 06/27/2016  . Diastolic HF (heart failure) ([place])   . Hypertension     Patient Active Problem List   Diagnosis Date Noted  . Intention tremor 06/29/2016  . Chronic diastolic heart failure ([place]) 06/29/2016  . Hypophosphatemia 06/29/2016  . Urinary tract infection 06/29/2016  . Sepsis ([place]) 06/27/2016  . Acute respiratory failure with hypoxia ([place]) 06/27/2016  . CAP (community acquired pneumonia) 06/27/2016  . Hypokalemia 06/27/2016  . Dehydration 06/27/2016  . Essential hypertension 06/27/2016  . Diabetes mellitus ([place]) 06/27/2016  . Cardiomegaly 06/27/2016  . No blood products 06/27/2016    Past Surgical History:  Procedure Laterality Date  . HERNIA REPAIR       OB History   No obstetric history on file.     Family History  Problem Relation Age of Onset  . Breast cancer Mother   . Diabetes Mother   . Diabetes Sister   . Prostate cancer Brother   . Diabetes Brother   . Diabetes Father   . CAD Neg Hx      Social History   Tobacco Use  . Smoking status: Never Smoker  . Smokeless tobacco: Never Used  Substance Use Topics  . Alcohol use: No  . Drug use: No    Home Medications Prior to Admission medications   Medication Sig Start Date End Date Taking? Authorizing Provider  amLODipine (NORVASC) 5 MG tablet Take 5 mg by mouth daily. 07/06/19  Yes [provider]  aspirin EC 81 MG tablet Take 81 mg by mouth daily.   Yes [provider]  hydrochlorothiazide (MICROZIDE) 12.5 MG capsule TAKE 1 CAPSULE BY MOUTH  EVERY MORNING 10/23/19  Yes Tolia, Sunit, DO  linaclotide (LINZESS) 72 MCG capsule Take 72 mcg by mouth daily before breakfast.   Yes [provider]  predniSONE (DELTASONE) 20 MG tablet Take 2 tablets daily with breakfast. 07/31/19  Yes Jaynee Eagles, PA-C    Allergies    Patient has no known allergies.  Review of Systems   Review of Systems  Constitutional: Negative for chills, fatigue and fever.  HENT: Negative for ear pain and sore throat.   Eyes: Negative for pain and visual disturbance.  Respiratory: Negative for cough and shortness of breath.   Cardiovascular: Positive for chest pain. Negative for palpitations.  Gastrointestinal: Positive for abdominal pain and constipation. Negative for anorexia, nausea and vomiting.  Genitourinary: Negative for dysuria and hematuria.  Musculoskeletal: Negative for arthralgias and back pain.  Skin: Negative for  color change and rash.  Neurological: Negative for seizures and syncope.  All other systems reviewed and are negative.   Physical Exam Updated Vital Signs  ED Triage Vitals [10/31/19 1226]  Enc Vitals Group     BP 139/77     Pulse Rate 73     Resp 16     Temp 98.2 F (36.8 C)     Temp Source Oral     SpO2 99 %     Weight      Height      Head Circumference      Peak Flow      Pain Score      Pain Loc      Pain Edu?      Excl. in [place]?     Physical Exam Vitals and nursing note reviewed.   Constitutional:      General: She is not in acute distress.    Appearance: She is well-developed.  HENT:     Head: Normocephalic and atraumatic.  Eyes:     Conjunctiva/sclera: Conjunctivae normal.     Pupils: Pupils are equal, round, and reactive to light.  Cardiovascular:     Rate and Rhythm: Normal rate and regular rhythm.     Pulses:          Radial pulses are 2+ on the right side and 2+ on the left side.     Heart sounds: Normal heart sounds. No murmur.  Pulmonary:     Effort: Pulmonary effort is normal. No respiratory distress.     Breath sounds: Normal breath sounds. No decreased breath sounds, wheezing or rhonchi.  Chest:     Chest wall: Tenderness present.  Abdominal:     General: There is distension.     Palpations: Abdomen is soft.     Tenderness: There is generalized abdominal tenderness.  Musculoskeletal:        General: Normal range of motion.     Cervical back: Normal range of motion and neck supple.     Right lower leg: No edema.     Left lower leg: No edema.  Skin:    General: Skin is warm and dry.     Capillary Refill: Capillary refill takes less than 2 seconds.  Neurological:     General: No focal deficit present.     Mental Status: She is alert.     ED Results / Procedures / Treatments   Labs (all labs ordered are listed, but only abnormal results are displayed) Labs Reviewed  BASIC METABOLIC PANEL - Abnormal; Notable for the following components:      Result Value   Potassium 3.3 (*)    All other components within normal limits  HEPATIC FUNCTION PANEL - Abnormal; Notable for the following components:   Total Protein 8.5 (*)    Albumin 3.3 (*)    Bilirubin, Direct 0.3 (*)    All other components within normal limits  CBC  LIPASE, BLOOD  TROPONIN I (HIGH SENSITIVITY)  TROPONIN I (HIGH SENSITIVITY)    EKG  EKG shows sinus rhythm.  No ischemic changes.  Unchanged from prior EKG.  Unremarkable intervals.  Radiology DG Chest 2 View  Result  Date: 10/31/2019 CLINICAL DATA:  Pain beneath the left breast and scapula the past 3 days. EXAM: CHEST - 2 VIEW COMPARISON:  06/27/2016 FINDINGS: Enlarged cardiac silhouette with improvement. The aorta remains tortuous and partially calcified. Improved depth of inspiration with stable prominent pulmonary vasculature and mildly prominent interstitial markings.  The lungs remain hyper expression at the lungs are hyperexpanded. No pleural fluid. Mild scoliosis. IMPRESSION: 1. No acute abnormality. 2. Improved cardiomegaly. 3. COPD. Electronically Signed   By: Claudie Revering M.D.   On: 10/31/2019 13:06   CT ABDOMEN PELVIS W CONTRAST  Result Date: 10/31/2019 CLINICAL DATA:  Generalized abdominal pain and distension, constipation, change in bowel habits EXAM: CT ABDOMEN AND PELVIS WITH CONTRAST TECHNIQUE: Multidetector CT imaging of the abdomen and pelvis was performed using the standard protocol following bolus administration of intravenous contrast. CONTRAST:  145mL OMNIPAQUE IOHEXOL 300 MG/ML  SOLN COMPARISON:  06/29/2016 FINDINGS: Lower chest: No acute pleural or parenchymal lung disease. Hepatobiliary: No focal liver abnormality is seen. No gallstones, gallbladder wall thickening, or biliary dilatation. Pancreas: Unremarkable. No pancreatic ductal dilatation or surrounding inflammatory changes. Spleen: Normal in size without focal abnormality. Adrenals/Urinary Tract: Decreased size right renal cyst, now measuring 2 cm with thin peripheral calcification. Previously this had measured 4.8 cm. Left kidney is unremarkable. Stable nodularity left adrenal gland compatible with adenoma based on long-term stability. The right adrenal is normal. No urinary tract calculi or obstruction. The bladder is unremarkable. Stomach/Bowel: No bowel obstruction or ileus. No bowel wall thickening or inflammatory changes. Scattered colonic diverticulosis without diverticulitis. Vascular/Lymphatic: Aortic atherosclerosis. No enlarged  abdominal or pelvic lymph nodes. Reproductive: Uterus and bilateral adnexa are unremarkable. Other: Postsurgical changes from previous ventral hernia repair. No free fluid or free intraperitoneal gas. Musculoskeletal: No acute or destructive bony lesions. Reconstructed images demonstrate no additional findings. IMPRESSION: 1. No acute intra-abdominal process to explain the patient's symptoms. 2. Decreased size of the right renal cyst, now measuring 2 cm with thin peripheral calcification. 3. Stable left adrenal adenoma based on long-term stability. 4. Colonic diverticulosis without diverticulitis. 5. Aortic Atherosclerosis (ICD10-I70.0). Electronically Signed   By: Randa Ngo M.D.   On: 10/31/2019 19:42    Procedures Procedures (including critical care time)  Medications Ordered in ED Medications  lidocaine (LIDODERM) 5 % 1 patch (1 patch Transdermal Patch Applied 10/31/19 1653)  sodium chloride flush (NS) 0.9 % injection 3 mL (3 mLs Intravenous Given 10/31/19 1832)  acetaminophen (TYLENOL) tablet 650 mg (650 mg Oral Given 10/31/19 1652)  sodium chloride 0.9 % bolus 500 mL (0 mLs Intravenous Stopped 10/31/19 1952)  iohexol (OMNIPAQUE) 300 MG/ML solution 100 mL (100 mLs Intravenous Contrast Given 10/31/19 1931)    ED Course  I have reviewed the triage vital signs and the nursing notes.  Pertinent labs & imaging results that were available during my care of the patient were reviewed by me and considered in my medical decision making (see chart for details).    MDM Rules/Calculators/A&P                      TEKEYAH GUNDRY is an 84 year old female history of hypertension, diabetes who presents the ED with chest pain, abdominal pain, constipation.  Patient with unremarkable vitals.  No fever.  EKG shows sinus rhythm.  No ischemic changes.  Having left-sided chest, abdominal pain.  States that she has not had a bowel movement in 4 days.  Has been using laxatives at home.  Feels like she is having gas  pain.  Denies any chest pressure, diaphoresis, shortness of breath.  Clear breath sounds on exam.  Lab work has already been in process.  Initial troponin is normal.  Low suspicion for ACS.  Mostly complaining of abdominal pain.  Will add hepatic function panel, lipase.  Will  get CT scan abdomen and pelvis.  Will evaluate for bowel obstruction versus constipation versus other intra-abdominal process.  Chest x-ray showed no signs of pneumonia, no pneumothorax, no pleural effusion.  No significant anemia, electrolyte abnormality, kidney injury otherwise.  Awaiting repeat troponin, CT scan.  Will give small fluid bolus.  Will give lidocaine patch and Tylenol as this could be MSK pain as patient does have some tenderness in the left chest wall area.  Repeat troponin normal.  Hepatic function panel unremarkable.  Gallbladder enzymes normal.  CT scan of the abdomen and pelvis showed no acute findings.  Overall likely MSK or GI related pain.  Discharged in good condition.  Given return precautions.  This chart was dictated using voice recognition software.  Despite best efforts to proofread,  errors can occur which can change the documentation meaning.    Final Clinical Impression(s) / ED Diagnoses Final diagnoses:  Abdominal pain, unspecified abdominal location  Atypical chest pain    Rx / DC Orders ED Discharge Orders    None       Lennice Sites, DO 10/31/19 1952

## 2019-10-31 NOTE — ED Triage Notes (Signed)
Pt reports pain under L breast/L shoulder pain, started 3 days ago. Pt denies sob. Seen at urgent care and sent here for further eval abnormal ekg.  A/ox4.

## 2019-10-31 NOTE — ED Notes (Signed)
Dr. Lanny Cramp in to explain to pt need for pt to go to ER 2/2 CP, SOBOE, and EKG result. Pt verbalized understanding.  Patient is being discharged from the Urgent [facility] and sent to the Emergency Department via wheelchair by staff. Per [place] patient is stable but in need of higher level of care due to CP, SOBOE, HTN. Patient is aware and verbalizes understanding of plan of care.  Vitals:   10/31/19 1158  BP: (!) 163/79  Pulse: 74  Resp: 20  Temp: 98.7 F (37.1 C)  SpO2: 96%

## 2020-01-07 ENCOUNTER — Other Ambulatory Visit: Payer: Self-pay | Admitting: Internal Medicine

## 2020-01-07 DIAGNOSIS — E2839 Other primary ovarian failure: Secondary | ICD-10-CM

## 2020-03-27 ENCOUNTER — Ambulatory Visit: Payer: Medicare HMO | Admitting: Cardiology

## 2020-03-27 ENCOUNTER — Encounter: Payer: Self-pay | Admitting: Cardiology

## 2020-03-27 ENCOUNTER — Other Ambulatory Visit: Payer: Self-pay

## 2020-03-27 VITALS — BP 149/86 | HR 84 | Ht 67.0 in | Wt 236.0 lb

## 2020-03-27 DIAGNOSIS — E6609 Other obesity due to excess calories: Secondary | ICD-10-CM

## 2020-03-27 DIAGNOSIS — R0609 Other forms of dyspnea: Secondary | ICD-10-CM

## 2020-03-27 DIAGNOSIS — I440 Atrioventricular block, first degree: Secondary | ICD-10-CM

## 2020-03-27 DIAGNOSIS — I10 Essential (primary) hypertension: Secondary | ICD-10-CM

## 2020-03-27 MED ORDER — HYDROCHLOROTHIAZIDE 25 MG PO TABS: 25.0000 mg | ORAL_TABLET | Freq: Every morning | ORAL | 0 refills | Status: DC

## 2020-03-27 NOTE — Patient Instructions (Signed)
Increase HCTZ to 25 mg p.o. every morning. Repeat blood work in 1 week after increasing HCTZ. Based on repeat blood work will decide if she will continue to be on 25 mg p.o. every morning or goes down to the original dose of 12.5 mg p.o. every morning.

## 2020-03-27 NOTE — Progress Notes (Signed)
Date: 03/27/2020 Last Office Visit: 09/26/2019  Chief Complaint  Patient presents with  . Hypertension  . Edema   HPI  Megan Ryan is a 84 y.o. female who presents to the office with a chief complaint of " high blood pressure management." Patient's past medical history and cardiac risk factors include: Hypertension, first degree AV block, postmenopausal female, obesity, advanced age.  Benign essential hypertension: Patient has been following up with the clinic for management of hypertension and is here for 6-month follow-up.  Patient states that she checks her blood pressure on a regular basis at home and systolic blood pressures range between 140-150 mmHg.  She tries to consume a low-salt diet.  No structured exercise program or daily routine.  She is able to do her activities of daily living at the age of 86.  She denies any chest pain at rest or with effort related activities.  Dyspnea on exertion is chronic and stable.  Overall euvolemic and not in congestive heart failure.  FUNCTIONAL STATUS: No structured exercise program or daily routine.  Patient ambulates with a cane.  ALLERGIES: No Known Allergies  MEDICATION LIST PRIOR TO VISIT: Current Outpatient Medications on File Prior to Visit  Medication Sig Dispense Refill  . amLODipine (NORVASC) 5 MG tablet Take 5 mg by mouth daily.    Marland Kitchen aspirin EC 81 MG tablet Take 81 mg by mouth daily.     No current facility-administered medications on file prior to visit.    PAST MEDICAL HISTORY: Past Medical History:  Diagnosis Date  . Diabetes mellitus ([place]) 06/27/2016  . Diastolic HF (heart failure) ([place])   . Hypertension     PAST SURGICAL HISTORY: Past Surgical History:  Procedure Laterality Date  . HERNIA REPAIR      FAMILY HISTORY: The patient family history includes Breast cancer in her mother; Diabetes in her brother, father, mother, and sister; Prostate cancer in her brother.   SOCIAL HISTORY:  The patient   reports that she has never smoked. She has never used smokeless tobacco. She reports that she does not drink alcohol and does not use drugs.  14 ORGAN REVIEW OF SYSTEMS: Review of Systems  Constitutional: Negative for chills and fever.  HENT: Negative for hoarse voice and nosebleeds.   Eyes: Negative for discharge, double vision and pain.  Cardiovascular: Positive for dyspnea on exertion (chronic and stable.). Negative for chest pain, claudication, leg swelling, near-syncope, orthopnea, palpitations, paroxysmal nocturnal dyspnea and syncope.  Respiratory: Negative for hemoptysis and shortness of breath.   Musculoskeletal: Negative for muscle cramps and myalgias.  Gastrointestinal: Negative for abdominal pain, constipation, diarrhea, hematemesis, hematochezia, melena, nausea and vomiting.  Neurological: Negative for dizziness and light-headedness.   PHYSICAL EXAM: Vitals with BMI 03/27/2020 10/31/2019 10/31/2019  Height 5\' 7"  - -  Weight 236 lbs - -  BMI 30.16 - -  Systolic 010 932 355  Diastolic 86 82 72  Pulse 84 60 59   CONSTITUTIONAL: Well-developed and well-nourished. No acute distress.  SKIN: Skin is warm and dry. No rash noted. No cyanosis. No pallor. No jaundice HEAD: Normocephalic and atraumatic.  EYES: No scleral icterus MOUTH/THROAT: Moist oral membranes.  NECK: No JVD present. No thyromegaly noted. No carotid bruits  LYMPHATIC: No visible cervical adenopathy.  CHEST Normal respiratory effort. No intercostal retractions  LUNGS: Clear to auscultation bilaterally.  No stridor. No wheezes. No rales.  CARDIOVASCULAR: Regular rate and rhythm, positive D3-U2, soft systolic murmur heard at the left sternal border, no gallops  or rubs. ABDOMINAL: Obese, soft, nontender, nondistended, positive bowel sounds in all 4 quadrants.  No apparent ascites.  EXTREMITIES: Trace bilateral peripheral edema  HEMATOLOGIC: No significant bruising NEUROLOGIC: Oriented to person, place, and time.  Nonfocal. Normal muscle tone.  PSYCHIATRIC: Normal mood and affect. Normal behavior. Cooperative  CARDIAC DATABASE: EKG: 03/27/2020: Normal sinus rhythm, 83 bpm, first-degree AV block, left atrial enlargement, old inferior infarct, poor R wave progression, T wave inversions in anteroseptal leads, no significant change compared to prior ECG 08/20/2019.   Echocardiogram: 08/29/2019: LVEF 50-55%, no regional wall motion abnormalities, normal diastolic filling pattern, normal left atrial pressure, mild LVH, mild MR, mild TR, mild PR, small circumferential pericardial effusion with no hemodynamic compromise.   Stress Testing: Lexiscan Tetrofosmin Stress Test  09/02/2019: Nondiagnostic ECG stress. Daphragmatic attenuation noted. Myocardial perfusion is normal. Stress LV EF: 75%.  No previous exam available for comparison. Low risk study.   Heart Catheterization: None  LABORATORY DATA: CBC Latest Ref Rng & Units 10/31/2019 12/27/2016 07/02/2016  WBC 4.0 - 10.5 K/uL 4.2 4.5 5.1  Hemoglobin 12.0 - 15.0 g/dL 12.0 12.2 9.8(L)  Hematocrit 36 - 46 % 38.2 36.5 29.9(L)  Platelets 150 - 400 K/uL 232 200 307    CMP Latest Ref Rng & Units 10/31/2019 09/30/2019 12/27/2016  Glucose 70 - 99 mg/dL 87 89 127(H)  BUN 8 - 23 mg/dL 19 14 16   Creatinine 0.44 - 1.00 mg/dL 0.75 0.70 0.74  Sodium 135 - 145 mmol/L 137 142 134(L)  Potassium 3.5 - 5.1 mmol/L 3.3(L) 3.7 3.5  Chloride 98 - 111 mmol/L 99 102 102  CO2 22 - 32 mmol/L 27 24 26   Calcium 8.9 - 10.3 mg/dL 9.6 9.5 9.4  Total Protein 6.5 - 8.1 g/dL 8.5(H) 7.9 8.9(H)  Total Bilirubin 0.3 - 1.2 mg/dL 0.9 0.4 0.5  Alkaline Phos 38 - 126 U/L 68 102 77  AST 15 - 41 U/L 27 11 22   ALT 0 - 44 U/L 21 11 20     Lipid Panel     Component Value Date/Time   CHOL 151 09/30/2019 0914   TRIG 59 09/30/2019 0914   HDL 42 09/30/2019 0914   LDLCALC 97 09/30/2019 0914   LABVLDL 12 09/30/2019 0914    Lab Results  Component Value Date   HGBA1C 6.4 (H) 09/30/2019   HGBA1C  6.2 (H) 06/28/2016   No components found for: NTPROBNP Lab Results  Component Value Date   TSH 1.111 06/28/2016   No recent blood work available for review.  FINAL MEDICATION LIST END OF ENCOUNTER: Meds ordered this encounter  Medications  . hydrochlorothiazide (HYDRODIURIL) 25 MG tablet    Sig: Take 1 tablet (25 mg total) by mouth in the morning.    Dispense:  90 tablet    Refill:  0    Medications Discontinued During This Encounter  Medication Reason  . predniSONE (DELTASONE) 20 MG tablet Completed Course  . linaclotide (LINZESS) 72 MCG capsule Completed Course  . hydrochlorothiazide (MICROZIDE) 12.5 MG capsule Dose change     Current Outpatient Medications:  .  amLODipine (NORVASC) 5 MG tablet, Take 5 mg by mouth daily., Disp: , Rfl:  .  aspirin EC 81 MG tablet, Take 81 mg by mouth daily., Disp: , Rfl:  .  hydrochlorothiazide (HYDRODIURIL) 25 MG tablet, Take 1 tablet (25 mg total) by mouth in the morning., Disp: 90 tablet, Rfl: 0  IMPRESSION:    ICD-10-CM   1. Dyspnea on exertion  R06.00 EKG 12-Lead  2. Essential hypertension  I10 hydrochlorothiazide (HYDRODIURIL) 25 MG tablet    Basic metabolic panel    Magnesium    Magnesium    Basic metabolic panel  3. First degree AV block  I44.0   4. Class 2 obesity due to excess calories without serious comorbidity with body mass index (BMI) of 37.0 to 37.9 in adult  E66.09    Z68.37      RECOMMENDATIONS: Benign essential hypertension: Improving  Office blood pressure are not at goal.   Blood pressures are currently not at goal either.  No blood pressure log to review.  Increase her hydrochlorothiazide to 25 mg p.o. every morning.  Recheck blood work in 1 week to reevaluate kidney function and electrolytes.  Low-salt diet recommended.  Dyspnea on exertion: Chronic and stable.   EKG performed, interpreted as noted above. Patient has undergone a recent ischemic evaluation as noted above. Monitor for now.   First-degree  AV block: Asymptomatic: Continue to monitor  Obesity, due to excess calories: Body mass index is 36.96 kg/m. I reviewed with the patient the importance of diet, regular physical activity/exercise, weight loss.   Patient is educated on increasing physical activity gradually as tolerated.  With the goal of moderate intensity exercise for 30 minutes a day 5 days a week.  Orders Placed This Encounter  Procedures  . Basic metabolic panel  . Magnesium  . EKG 12-Lead   --Continue cardiac medications as reconciled in final medication list. --Return in about 6 months (around 09/25/2020) for BP.. Or sooner if needed. --Continue follow-up with your primary care physician regarding the management of your other chronic comorbid conditions.  Patient's questions and concerns were addressed to her satisfaction. She voices understanding of the instructions provided during this encounter.   This note was created using a voice recognition software as a result there may be grammatical errors inadvertently enclosed that do not reflect the nature of this encounter. Every attempt is made to correct such errors.  Rex Kras, [place], [facility]  Pager: [phone] Office: [phone]

## 2020-04-07 LAB — MAGNESIUM: Magnesium: 2.2 mg/dL (ref 1.6–2.3)

## 2020-04-07 LAB — BASIC METABOLIC PANEL
BUN/Creatinine Ratio: 20 (ref 12–28)
BUN: 18 mg/dL (ref 8–27)
CO2: 24 mmol/L (ref 20–29)
Calcium: 9.6 mg/dL (ref 8.7–10.3)
Chloride: 101 mmol/L (ref 96–106)
Creatinine, Ser: 0.91 mg/dL (ref 0.57–1.00)
GFR calc Af Amer: 68 mL/min/{1.73_m2} (ref >59)
GFR calc non Af Amer: 59 mL/min/{1.73_m2} — ABNORMAL LOW (ref >59)
Glucose: 97 mg/dL (ref 65–99)
Potassium: 4.2 mmol/L (ref 3.5–5.2)
Sodium: 140 mmol/L (ref 134–144)

## 2020-04-08 NOTE — Progress Notes (Signed)
Pt didn't answer will try again later

## 2020-04-09 ENCOUNTER — Other Ambulatory Visit: Payer: Self-pay

## 2020-04-09 DIAGNOSIS — I10 Essential (primary) hypertension: Secondary | ICD-10-CM

## 2020-04-09 MED ORDER — HYDROCHLOROTHIAZIDE 25 MG PO TABS: 25.0000 mg | ORAL_TABLET | Freq: Every morning | ORAL | 0 refills | Status: DC

## 2020-04-09 NOTE — Progress Notes (Signed)
Spoke to patient she is aware

## 2020-04-13 ENCOUNTER — Other Ambulatory Visit: Payer: Self-pay

## 2020-04-13 DIAGNOSIS — I10 Essential (primary) hypertension: Secondary | ICD-10-CM

## 2020-04-13 MED ORDER — HYDROCHLOROTHIAZIDE 25 MG PO TABS: 25.0000 mg | ORAL_TABLET | Freq: Every morning | ORAL | 0 refills | Status: DC

## 2020-06-27 ENCOUNTER — Other Ambulatory Visit: Payer: Self-pay

## 2020-06-27 ENCOUNTER — Emergency Department (HOSPITAL_COMMUNITY)
Admission: EM | Admit: 2020-06-27 | Discharge: 2020-06-28 | Disposition: A | Discharge status: Home / Self Care | Payer: Medicare HMO | Attending: Emergency Medicine | Admitting: Emergency Medicine

## 2020-06-27 ENCOUNTER — Ambulatory Visit (HOSPITAL_COMMUNITY)
Admission: EM | Admit: 2020-06-27 | Discharge: 2020-06-27 | Disposition: A | Discharge status: Other Acute Inpatient Hospital | Payer: Medicare HMO | Attending: Emergency Medicine | Admitting: Emergency Medicine

## 2020-06-27 ENCOUNTER — Encounter (HOSPITAL_COMMUNITY): Payer: Self-pay | Admitting: *Deleted

## 2020-06-27 DIAGNOSIS — N3000 Acute cystitis without hematuria: Secondary | ICD-10-CM | POA: Insufficient documentation

## 2020-06-27 DIAGNOSIS — Z79899 Other long term (current) drug therapy: Secondary | ICD-10-CM | POA: Diagnosis not present

## 2020-06-27 DIAGNOSIS — R1084 Generalized abdominal pain: Secondary | ICD-10-CM | POA: Diagnosis not present

## 2020-06-27 DIAGNOSIS — Z7982 Long term (current) use of aspirin: Secondary | ICD-10-CM | POA: Diagnosis not present

## 2020-06-27 DIAGNOSIS — E119 Type 2 diabetes mellitus without complications: Secondary | ICD-10-CM | POA: Insufficient documentation

## 2020-06-27 DIAGNOSIS — R109 Unspecified abdominal pain: Secondary | ICD-10-CM | POA: Diagnosis not present

## 2020-06-27 DIAGNOSIS — R112 Nausea with vomiting, unspecified: Secondary | ICD-10-CM | POA: Diagnosis not present

## 2020-06-27 DIAGNOSIS — I110 Hypertensive heart disease with heart failure: Secondary | ICD-10-CM | POA: Insufficient documentation

## 2020-06-27 DIAGNOSIS — I5032 Chronic diastolic (congestive) heart failure: Secondary | ICD-10-CM | POA: Diagnosis not present

## 2020-06-27 LAB — POCT URINALYSIS DIPSTICK, ED / UC
Bilirubin Urine: NEGATIVE
Glucose, UA: NEGATIVE mg/dL
Hgb urine dipstick: NEGATIVE
Ketones, ur: NEGATIVE mg/dL
Nitrite: NEGATIVE
Protein, ur: NEGATIVE mg/dL
Specific Gravity, Urine: 1.025 (ref 1.005–1.030)
Urobilinogen, UA: 0.2 mg/dL (ref 0.0–1.0)
pH: 7.5 (ref 5.0–8.0)

## 2020-06-27 LAB — CBC
HCT: 36.6 % (ref 36.0–46.0)
Hemoglobin: 11.5 g/dL — ABNORMAL LOW (ref 12.0–15.0)
MCH: 29.9 pg (ref 26.0–34.0)
MCHC: 31.4 g/dL (ref 30.0–36.0)
MCV: 95.3 fL (ref 80.0–100.0)
Platelets: 211 10*3/uL (ref 150–400)
RBC: 3.84 MIL/uL — ABNORMAL LOW (ref 3.87–5.11)
RDW: 13.8 % (ref 11.5–15.5)
WBC: 6 10*3/uL (ref 4.0–10.5)
nRBC: 0 % (ref 0.0–0.2)

## 2020-06-27 LAB — URINALYSIS, ROUTINE W REFLEX MICROSCOPIC
Bacteria, UA: NONE SEEN
Bilirubin Urine: NEGATIVE
Glucose, UA: NEGATIVE mg/dL
Hgb urine dipstick: NEGATIVE
Ketones, ur: NEGATIVE mg/dL
Nitrite: NEGATIVE
Protein, ur: NEGATIVE mg/dL
Specific Gravity, Urine: 1.017 (ref 1.005–1.030)
pH: 6 (ref 5.0–8.0)

## 2020-06-27 LAB — COMPREHENSIVE METABOLIC PANEL
ALT: 14 U/L (ref 0–44)
AST: 20 U/L (ref 15–41)
Albumin: 3.2 g/dL — ABNORMAL LOW (ref 3.5–5.0)
Alkaline Phosphatase: 75 U/L (ref 38–126)
Anion gap: 11 (ref 5–15)
BUN: 15 mg/dL (ref 8–23)
CO2: 26 mmol/L (ref 22–32)
Calcium: 9.5 mg/dL (ref 8.9–10.3)
Chloride: 100 mmol/L (ref 98–111)
Creatinine, Ser: 0.78 mg/dL (ref 0.44–1.00)
GFR, Estimated: >60 mL/min (ref >60)
Glucose, Bld: 123 mg/dL — ABNORMAL HIGH (ref 70–99)
Potassium: 3.7 mmol/L (ref 3.5–5.1)
Sodium: 137 mmol/L (ref 135–145)
Total Bilirubin: 0.4 mg/dL (ref 0.3–1.2)
Total Protein: 8.5 g/dL — ABNORMAL HIGH (ref 6.5–8.1)

## 2020-06-27 LAB — CBG MONITORING, ED: Glucose-Capillary: 125 mg/dL — ABNORMAL HIGH (ref 70–99)

## 2020-06-27 LAB — LIPASE, BLOOD: Lipase: 20 U/L (ref 11–51)

## 2020-06-27 MED ORDER — ONDANSETRON 4 MG PO TBDP
4.0000 mg | ORAL_TABLET | Freq: Once | ORAL | Status: AC
  Administered 2020-06-27: 4 mg via ORAL
  Filled 2020-06-27: qty 1

## 2020-06-27 MED ORDER — ONDANSETRON 4 MG PO TBDP
ORAL_TABLET | ORAL | Status: AC
  Filled 2020-06-27: qty 1

## 2020-06-27 MED ORDER — SODIUM CHLORIDE 0.9 % IV BOLUS
500.0000 mL | Freq: Once | INTRAVENOUS | Status: AC
  Administered 2020-06-27: 500 mL via INTRAVENOUS

## 2020-06-27 MED ORDER — ONDANSETRON 4 MG PO TBDP
4.0000 mg | ORAL_TABLET | Freq: Once | ORAL | Status: AC
  Administered 2020-06-27: 4 mg via ORAL

## 2020-06-27 MED ORDER — ONDANSETRON HCL 4 MG/2ML IJ SOLN
4.0000 mg | Freq: Once | INTRAMUSCULAR | Status: AC
  Administered 2020-06-27: 4 mg via INTRAVENOUS
  Filled 2020-06-27: qty 2

## 2020-06-27 MED ORDER — MORPHINE SULFATE (PF) 2 MG/ML IV SOLN
2.0000 mg | Freq: Once | INTRAVENOUS | Status: AC
  Administered 2020-06-27: 2 mg via INTRAVENOUS
  Filled 2020-06-27: qty 1

## 2020-06-27 MED ORDER — SODIUM CHLORIDE 0.9 % IV BOLUS: 1000.0000 mL | Freq: Once | INTRAVENOUS | Status: DC

## 2020-06-27 NOTE — ED Provider Notes (Signed)
HPI  SUBJECTIVE:  Megan Ryan is a 85 y.o. female who presents with epigastric pain that radiates to her left abdomen starting yesterday.  It is intermittent, lasting 30 minutes.  She describes it as tightness.  It does not migrate or radiate anywhere else.  She reports nausea, "countless" episodes of nonbilious nonbloody vomiting and "dry heaving".  Member states the patient is unable to tolerate any p.o. whatsoever.  Patient reports anorexia, lightheadedness, dizziness.  No diarrhea, abdominal distention.  She had a normal bowel movement yesterday.  No urinary complaints.  She reports decreased urine output, states that she has urinated twice today.  No increased thirst.  No nasal congestion or rhinorrhea, sore throat, body, headaches, cough, wheeze, chest pain, shortness of breath.  No antipyretic in the past 6 hours.  Has not tried anything for this.  She states her symptoms are worse before vomiting and become a little better afterwards.  She states her pain is also aggravated with walking.  She states that the car ride over here was painful.  Patient has a past medical history including CHF, hypokalemia, respiratory failure, community-acquired pneumonia, hypertension, diabetes, does not check her sugar at home" UTI, sepsis, status post hernia repair.  No history of DKA, MI.  [id number], Edwin, MD   Past Medical History:  Diagnosis Date  . Diabetes mellitus ([place]) 06/27/2016  . Diastolic HF (heart failure) ([place])   . Hypertension     Past Surgical History:  Procedure Laterality Date  . HERNIA REPAIR      Family History  Problem Relation Age of Onset  . Breast cancer Mother   . Diabetes Mother   . Diabetes Sister   . Prostate cancer Brother   . Diabetes Brother   . Diabetes Father   . CAD Neg Hx     Social History   Tobacco Use  . Smoking status: Never Smoker  . Smokeless tobacco: Never Used  Vaping Use  . Vaping Use: Never used  Substance Use Topics  . Alcohol use: No  .  Drug use: No    No current facility-administered medications for this encounter.  Current Outpatient Medications:  .  amLODipine (NORVASC) 5 MG tablet, Take 5 mg by mouth daily., Disp: , Rfl:  .  aspirin EC 81 MG tablet, Take 81 mg by mouth daily., Disp: , Rfl:  .  hydrochlorothiazide (HYDRODIURIL) 25 MG tablet, Take 1 tablet (25 mg total) by mouth in the morning., Disp: 90 tablet, Rfl: 0  No Known Allergies   ROS  As noted in HPI.   Physical Exam  BP 139/73 (BP Location: Left Arm)   Pulse 76   Temp 99 F (37.2 C) (Oral)   Resp 18   SpO2 99%   Constitutional: Well developed, well nourished, appears uncomfortable Eyes:  EOMI, conjunctiva normal bilaterally HENT: Normocephalic, atraumatic,mucus membranes moist Respiratory: Normal inspiratory effort Cardiovascular: Normal rate GI: nondistended.  Positive diffuse tenderness maximal in the epigastric and left side of the abdomen.  Positive voluntary guarding, no rebound.  Hypoactive bowel sounds.  Negative Murphy, negative McBurney. Back: No CVAT skin: No rash, skin intact Musculoskeletal: no deformities Neurologic: Alert & oriented x 3, no focal neuro deficits Psychiatric: Speech and behavior appropriate   ED Course   Medications  ondansetron (ZOFRAN-ODT) disintegrating tablet 4 mg (4 mg Oral Given 06/27/20 1324)    Orders Placed This Encounter  Procedures  . POC CBG monitoring    Standing Status:   Standing    Number  of Occurrences:   1    Results for orders placed or performed during the hospital encounter of 06/27/20 (from the past 24 hour(s))  POC CBG monitoring     Status: Abnormal   Collection Time: 06/27/20  1:46 PM  Result Value Ref Range   Glucose-Capillary 125 (H) 70 - 99 mg/dL  POC Urinalysis dipstick     Status: Abnormal   Collection Time: 06/27/20  1:47 PM  Result Value Ref Range   Glucose, UA NEGATIVE NEGATIVE mg/dL   Bilirubin Urine NEGATIVE NEGATIVE   Ketones, ur NEGATIVE NEGATIVE mg/dL    Specific Gravity, Urine 1.025 1.005 - 1.030   Hgb urine dipstick NEGATIVE NEGATIVE   pH 7.5 5.0 - 8.0   Protein, ur NEGATIVE NEGATIVE mg/dL   Urobilinogen, UA 0.2 0.0 - 1.0 mg/dL   Nitrite NEGATIVE NEGATIVE   Leukocytes,Ua TRACE (A) NEGATIVE   No results found.  ED Clinical Impression  1. Generalized abdominal pain   2. Nausea and vomiting, intractability of vomiting not specified, unspecified vomiting type      ED Assessment/Plan  Patient has generalized abdominal pain, primarily in the epigastric, left side her abdomen.  She has some voluntary guarding, but no rebound.  Absent bowel sounds, however not distended.  She appears uncomfortable here.  She was given Zofran which she states helped her nausea.  The differential of abdominal pain is vast, however given the abdominal tenderness, I do not think that it is ACS.  Her glucose was within normal limits here so not DKA or HHNK. she had trace leukocytes on her urine dip, however no urinary complaints.  In the differential is colitis, pancreatitis, diverticulitis, obstruction, perforation, UTI, pyelonephritis, nephrolithiasis.   transferring to the emergency department for comprehensive evaluation, pain control, IV fluids.  Discussed rationale for transfer to the emergency department.  Staff will wheel patient down to the ED.  Advised patient to be n.p.o. until ER evaluation is complete.  Patient and family member agree with plan.  Meds ordered this encounter  Medications  . ondansetron (ZOFRAN-ODT) disintegrating tablet 4 mg    *This clinic note was created using Scientist, clinical (histocompatibility and immunogenetics). Therefore, there may be occasional mistakes despite careful proofreading.   ?    Domenick Gong, MD 06/27/20 [phone]

## 2020-06-27 NOTE — Discharge Instructions (Addendum)
Go immediately to the [facility] or [facility] emergency department.  I feel that you need to have a work-up that is beyond the scope of the urgent care.  I think that you would benefit from some IV fluids, pain control, labs, and possibly CT scan so that we can identify what is causing your pain.  Please do not have anything to eat or drink until your ER evaluation is complete.

## 2020-06-27 NOTE — ED Triage Notes (Signed)
Pt presents to ED POV. Pt c/o R flank pain and abd pain. Pt reports that pain is intermittent and she has been urinating less. Pt also c/o nausea and cannot keep food or fluids down

## 2020-06-27 NOTE — ED Provider Notes (Signed)
[facility] EMERGENCY DEPARTMENT Provider Note   CSN: [id number] Arrival date & time: 06/27/20  1500     History Chief Complaint  Patient presents with  . Flank Pain    Megan Ryan is a 85 y.o. female.  HPI   85 year old female the history of diabetes, heart failure, hypertension, who presents to the emergency department today for evaluation of abdominal pain states for the last few days she has had left-sided and periumbilical abdominal pain.  The pain radiates to the left flank area.  The pain is intermittent in nature.  She has had associated nausea.  She also feels like she has had some constipation as well.  She denies any urinary symptoms or fevers.  She denies any chest pain, shortness of breath, numbness weakness or pain to the lower extremities  Past Medical History:  Diagnosis Date  . Diabetes mellitus ([name]) 06/27/2016  . Diastolic HF (heart failure) ([name])   . Hypertension     Patient Active Problem List   Diagnosis Date Noted  . Intention tremor 06/29/2016  . Chronic diastolic heart failure ([name]) 06/29/2016  . Hypophosphatemia 06/29/2016  . Urinary tract infection 06/29/2016  . Sepsis ([name]) 06/27/2016  . Acute respiratory failure with hypoxia ([name]) 06/27/2016  . CAP (community acquired pneumonia) 06/27/2016  . Hypokalemia 06/27/2016  . Dehydration 06/27/2016  . Essential hypertension 06/27/2016  . Diabetes mellitus ([name]) 06/27/2016  . Cardiomegaly 06/27/2016  . No blood products 06/27/2016    Past Surgical History:  Procedure Laterality Date  . HERNIA REPAIR       OB History   No obstetric history on file.     Family History  Problem Relation Age of Onset  . Breast cancer Mother   . Diabetes Mother   . Diabetes Sister   . Prostate cancer Brother   . Diabetes Brother   . Diabetes Father   . CAD Neg Hx     Social History   Tobacco Use  . Smoking status: Never Smoker  . Smokeless tobacco: Never Used  Vaping Use  .  Vaping Use: Never used  Substance Use Topics  . Alcohol use: No  . Drug use: No    Home Medications Prior to Admission medications   Medication Sig Start Date End Date Taking? Authorizing Provider  cephALEXin (KEFLEX) 500 MG capsule Take 1 capsule (500 mg total) by mouth 4 (four) times daily for 7 days. 06/28/20 07/05/20 Yes Adamaris King S, PA-C  amLODipine (NORVASC) 5 MG tablet Take 5 mg by mouth daily. 07/06/19   [provider]  aspirin EC 81 MG tablet Take 81 mg by mouth daily.    [provider]  hydrochlorothiazide (HYDRODIURIL) 25 MG tablet Take 1 tablet (25 mg total) by mouth in the morning. 04/13/20 07/12/20  Patwardhan, Anabel Bene, MD    Allergies    Patient has no known allergies.  Review of Systems   Review of Systems  Constitutional: Negative for fever.  HENT: Negative for ear pain and sore throat.   Eyes: Negative for visual disturbance.  Respiratory: Negative for cough and shortness of breath.   Cardiovascular: Negative for chest pain.  Gastrointestinal: Positive for abdominal pain, constipation, nausea and vomiting. Negative for diarrhea.  Genitourinary: Negative for dysuria, hematuria and urgency.  Musculoskeletal: Negative for back pain.  Skin: Negative for rash.  Neurological: Negative for seizures and headaches.  All other systems reviewed and are negative.   Physical Exam Updated Vital Signs BP 140/65  Pulse (!) 55   Temp 98.6 F (37 C) (Oral)   Resp 18   SpO2 94%   Physical Exam Vitals and nursing note reviewed.  Constitutional:      General: She is not in acute distress.    Appearance: She is well-developed and well-nourished.  HENT:     Head: Normocephalic and atraumatic.  Eyes:     Conjunctiva/sclera: Conjunctivae normal.  Cardiovascular:     Rate and Rhythm: Normal rate and regular rhythm.     Heart sounds: Normal heart sounds. No murmur heard.   Pulmonary:     Effort: Pulmonary effort is normal. No respiratory distress.      Breath sounds: Normal breath sounds. No wheezing, rhonchi or rales.  Abdominal:     General: Bowel sounds are normal.     Palpations: Abdomen is soft.     Tenderness: There is abdominal tenderness in the periumbilical area, left upper quadrant and left lower quadrant. There is left CVA tenderness and guarding. There is no right CVA tenderness or rebound.  Musculoskeletal:        General: No edema.     Cervical back: Neck supple.  Skin:    General: Skin is warm and dry.  Neurological:     Mental Status: She is alert.  Psychiatric:        Mood and Affect: Mood and affect normal.     ED Results / Procedures / Treatments   Labs (all labs ordered are listed, but only abnormal results are displayed) Labs Reviewed  COMPREHENSIVE METABOLIC PANEL - Abnormal; Notable for the following components:      Result Value   Glucose, Bld 123 (*)    Total Protein 8.5 (*)    Albumin 3.2 (*)    All other components within normal limits  CBC - Abnormal; Notable for the following components:   RBC 3.84 (*)    Hemoglobin 11.5 (*)    All other components within normal limits  URINALYSIS, ROUTINE W REFLEX MICROSCOPIC - Abnormal; Notable for the following components:   APPearance HAZY (*)    Leukocytes,Ua MODERATE (*)    All other components within normal limits  URINE CULTURE  LIPASE, BLOOD    EKG None     Radiology CT ABDOMEN PELVIS W CONTRAST  Result Date: 06/28/2020 CLINICAL DATA:  Abdominal pain, nausea, vomiting, right flank pain EXAM: CT ABDOMEN AND PELVIS WITH CONTRAST TECHNIQUE: Multidetector CT imaging of the abdomen and pelvis was performed using the standard protocol following bolus administration of intravenous contrast. CONTRAST:  161mL OMNIPAQUE IOHEXOL 300 MG/ML  SOLN COMPARISON:  10/31/2019 FINDINGS: Lower chest: The visualized lung bases are clear bilaterally. The visualized heart and pericardium are unremarkable. Hepatobiliary: No focal liver abnormality is seen. No  gallstones, gallbladder wall thickening, or biliary dilatation. Pancreas: Unremarkable Spleen: Unremarkable Adrenals/Urinary Tract: A 15 mm avidly enhancing nodule is seen within the left adrenal gland, indeterminate on this examination. This was, however, described by report on prior CT examination of 08/20/2006 where it was reported to measures 16 mm and, given its stability, this may represent a adrenal adenoma. The right adrenal gland is unremarkable. 18 mm low-attenuation lesion within the right kidney compatible with a simple cyst, better characterized on prior examination of 06/29/2016. The kidneys are otherwise unremarkable. Bladder unremarkable. Stomach/Bowel: Stomach is within normal limits. Appendix appears normal. No evidence of bowel wall thickening, distention, or inflammatory changes. No free intraperitoneal gas or fluid. Combination umbilical and infraumbilical ventral hernia repair with mesh  has been performed. Vascular/Lymphatic: Extensive aortoiliac atherosclerotic calcification. No aortic aneurysm. No pathologic adenopathy within the abdomen and pelvis. Reproductive: Uterus and bilateral adnexa are unremarkable. Other: Rectum unremarkable. Musculoskeletal: Degenerative changes are seen within the lumbar spine. No lytic or blastic bone lesion is identified. IMPRESSION: No acute intra-abdominal pathology identified. No definite radiographic explanation for the patient's reported abdominal pain. 15 mm probable left adrenal adenoma. Peripheral vascular disease. Aortic Atherosclerosis (ICD10-I70.0). Electronically Signed   By: Fidela Salisbury MD   On: 06/28/2020 00:49    Procedures Procedures (including critical care time)  Medications Ordered in ED Medications  ondansetron (ZOFRAN-ODT) disintegrating tablet 4 mg (4 mg Oral Given 06/27/20 1516)  morphine 2 MG/ML injection 2 mg (2 mg Intravenous Given 06/27/20 2300)  ondansetron (ZOFRAN) injection 4 mg (4 mg Intravenous Given 06/27/20 2301)  sodium  chloride 0.9 % bolus 500 mL (0 mLs Intravenous Stopped 06/28/20 0115)  iohexol (OMNIPAQUE) 300 MG/ML solution 100 mL (100 mLs Intravenous Contrast Given 06/28/20 0020)    ED Course  I have reviewed the triage vital signs and the nursing notes.  Pertinent labs & imaging results that were available during my care of the patient were reviewed by me and considered in my medical decision making (see chart for details).    MDM Rules/Calculators/A&P                          85 year old female with a history of abdominal and flank pain ongoing intermittently for the last several days.  Associated with nausea and vomiting.  No associated urinary complaints or fevers.  Reviewed/interpreted lab CBC is without leukocytosis with some mild anemia CMP shows normal electrolytes, kidney and liver function Lipase is negative UA shows some moderate leukocytes and 21-50 RBCs with 0-5 RBCs and no bacteria noted. Will tx with keflex and culture.  Ct abd/pelvis - No acute intra-abdominal pathology identified. No definite radiographic explanation for the patient's reported abdominal pain. 15 mm probable left adrenal adenoma. Peripheral vascular disease. Aortic Atherosclerosis   Pt here with abd pain, nv and flank pain. Ct reassuring. Likely UTI. Will tx with abx. Tolerating po in the ed and pain improved. Advised on pcp f/u and strict return precautions. She voices understanding and is agreement with plan. All questions answered, pt stable for discharge.    Final Clinical Impression(s) / ED Diagnoses Final diagnoses:  Abdominal pain, unspecified abdominal location  Acute cystitis without hematuria    Rx / DC Orders ED Discharge Orders         Ordered    cephALEXin (KEFLEX) 500 MG capsule  4 times daily        06/28/20 0151           Rodney Booze, PA-C 06/28/20 0152    Orpah Greek, MD 06/28/20 [phone]

## 2020-06-27 NOTE — ED Triage Notes (Signed)
Pt reports feeling nauseated and started vomiting at 0200  This Am. Pt reports ABD pain.

## 2020-06-28 ENCOUNTER — Emergency Department (HOSPITAL_COMMUNITY): Payer: Medicare HMO

## 2020-06-28 MED ORDER — CEPHALEXIN 500 MG PO CAPS: 500.0000 mg | ORAL_CAPSULE | Freq: Four times a day (QID) | ORAL | 0 refills | Status: AC

## 2020-06-28 MED ORDER — IOHEXOL 300 MG/ML  SOLN
100.0000 mL | Freq: Once | INTRAMUSCULAR | Status: AC | PRN
  Administered 2020-06-28: 100 mL via INTRAVENOUS

## 2020-06-28 NOTE — Discharge Instructions (Signed)
You were given a prescription for antibiotics. Please take the antibiotic prescription fully.  ° °Please follow up with your primary doctor within the next 5-7 days.  If you do not have a primary care provider, information for a healthcare clinic has been provided for you to make arrangements for follow up care. Please return to the ER sooner if you have any new or worsening symptoms, or if you have any of the following symptoms: ° °Abdominal pain that does not go away.  °You have a fever.  °You keep throwing up (vomiting).  °The pain is felt only in portions of the abdomen. Pain in the right side could possibly be appendicitis. In an adult, pain in the left lower portion of the abdomen could be colitis or diverticulitis.  °You pass bloody or black tarry stools.  °There is bright red blood in the stool.  °The constipation stays for more than 4 days.  °There is belly (abdominal) or rectal pain.  °You do not seem to be getting better.  °You have any questions or concerns.  ° °

## 2020-06-28 NOTE — ED Notes (Signed)
Pt returned from CT scan.

## 2020-06-29 LAB — URINE CULTURE

## 2020-08-11 IMAGING — CT CT ABD-PELV W/ CM
2 of 5 series · 16 of 46 positions shown, 18 images · IV contrast (omnipaque)
Comparison: 06/29/2016

CLINICAL DATA: Generalized abdominal pain and distension,
constipation, change in bowel habits

EXAM:
CT ABDOMEN AND PELVIS WITH CONTRAST
TECHNIQUE: Multidetector CT imaging of the abdomen and pelvis was performed
using the standard protocol following bolus administration of
intravenous contrast.
CONTRAST:  100mL OMNIPAQUE IOHEXOL 300 MG/ML  SOLN

[Series 3: a/p w/ 5mm · axial · 0.98mm/px · z∈[+883,+1288]mm · 13 of 91 slices shown, 15 images]
[im 5/91  soft-tissue]
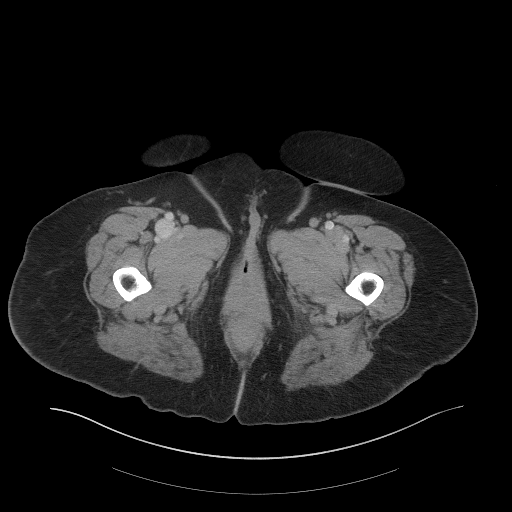
[im 5/91  bone]
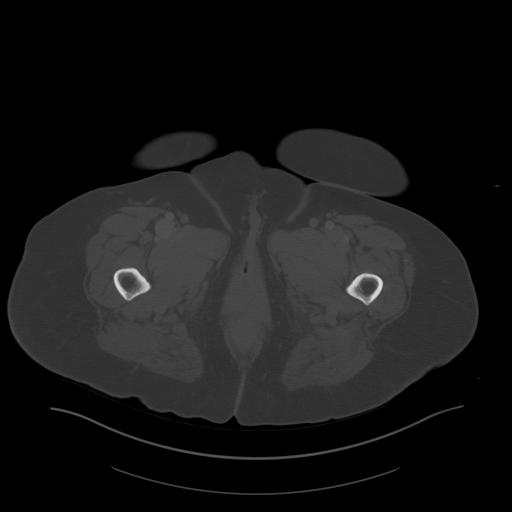
[im 15/91  soft-tissue]
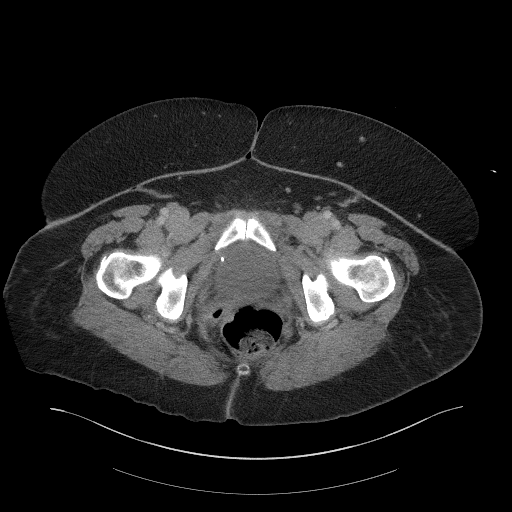
[im 19/91  soft-tissue]
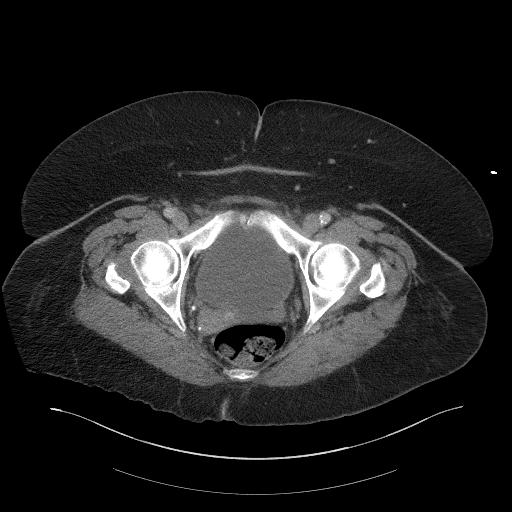
[im 24/91  soft-tissue]
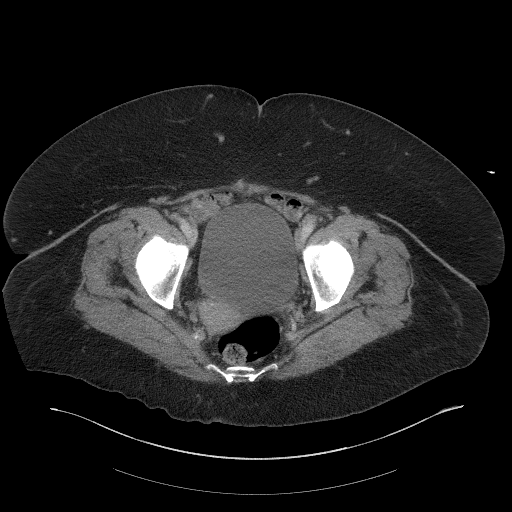
[im 34/91  soft-tissue]
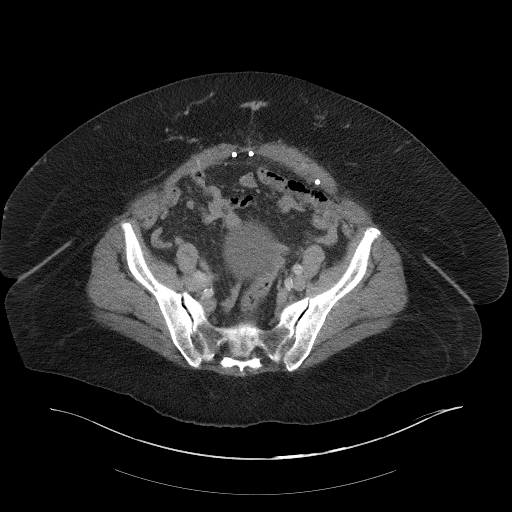
[im 38/91  soft-tissue]
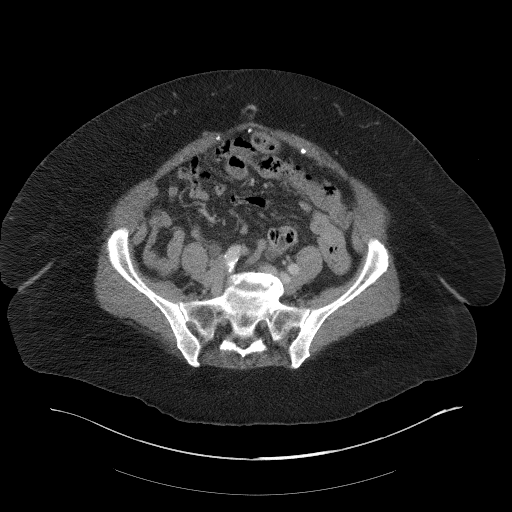
[im 48/91  soft-tissue]
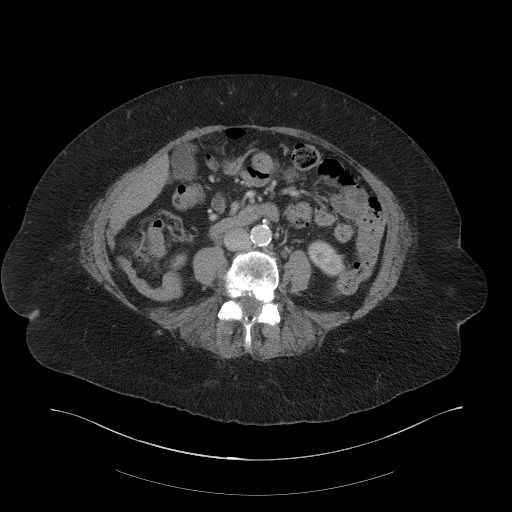
[im 53/91  soft-tissue]
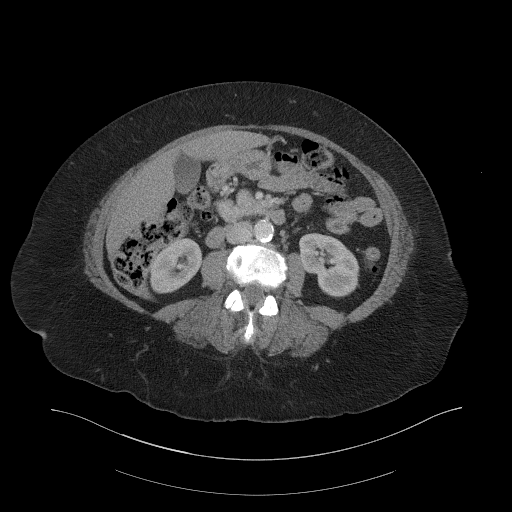
[im 57/91  soft-tissue]
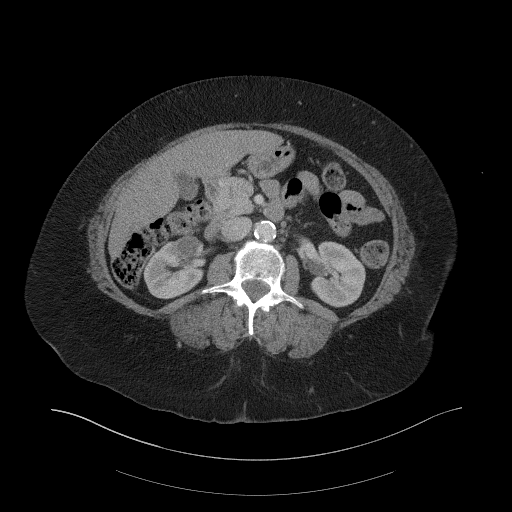
[im 57/91  bone]
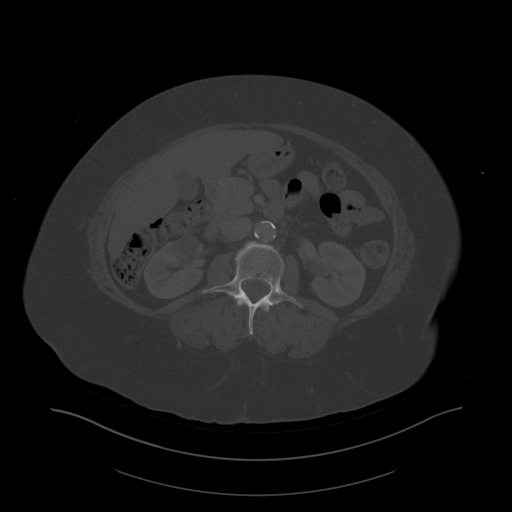
[im 67/91  soft-tissue]
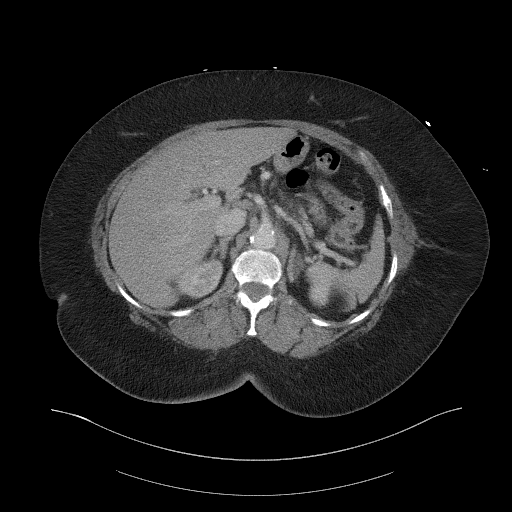
[im 72/91  soft-tissue]
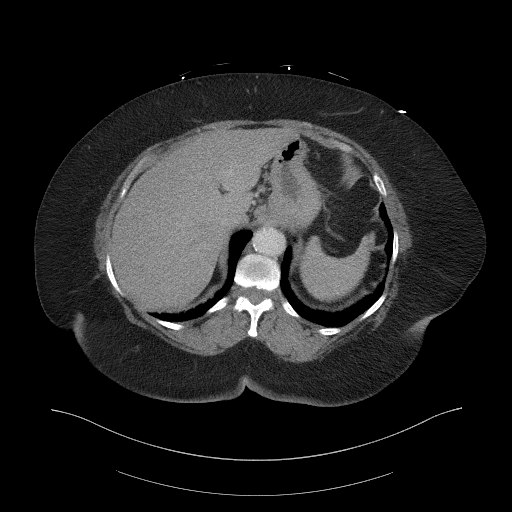
[im 76/91  soft-tissue]
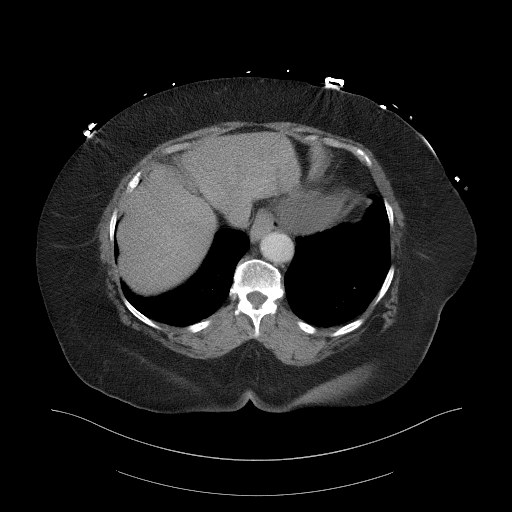
[im 86/91  soft-tissue]
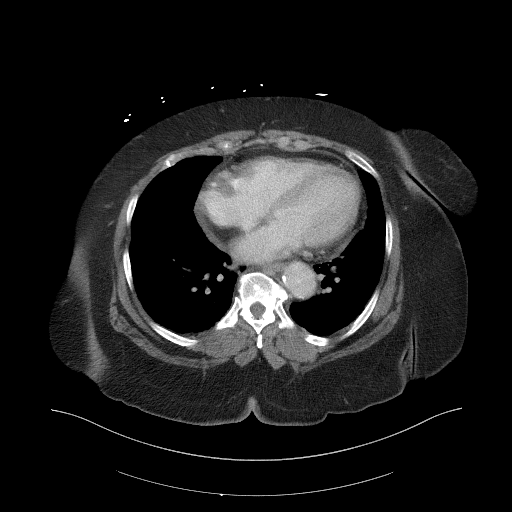

[Series 6: a/p w/ cor · coronal · 0.88mm/px · 3 of 198 slices shown]
[im 66/198  soft-tissue]
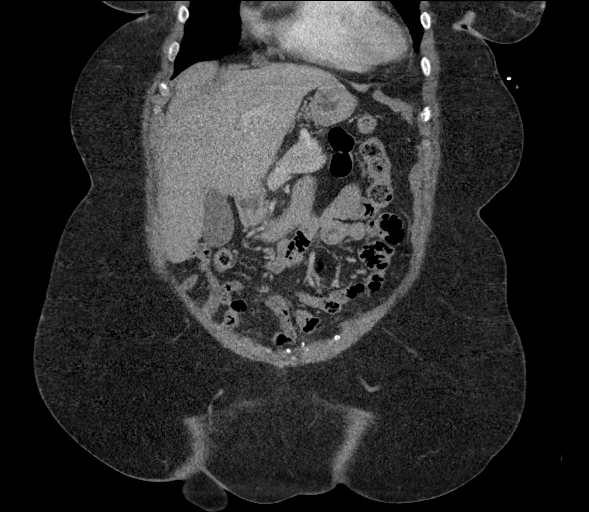
[im 88/198  soft-tissue]
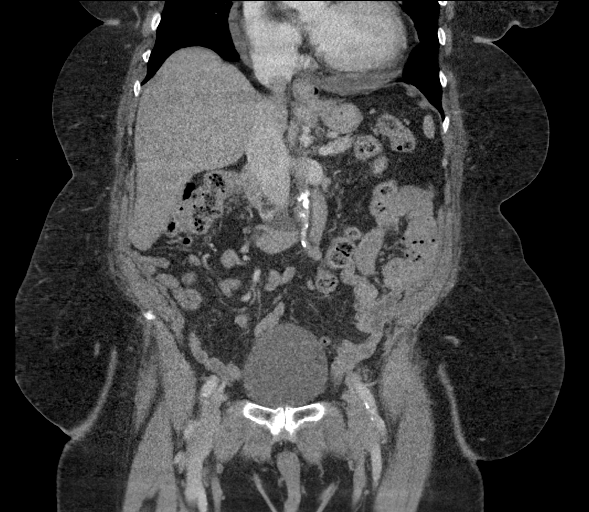
[im 110/198  soft-tissue]
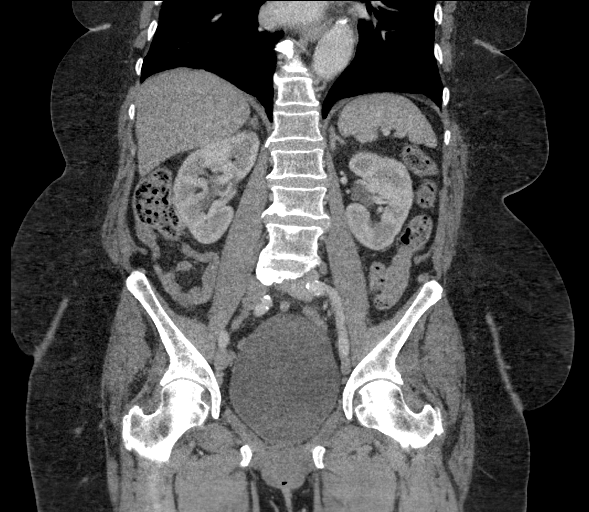

[16 of 46 positions shown; findings below may reference images not displayed]

FINDINGS: Lower chest: No acute pleural or parenchymal lung disease.

Hepatobiliary: No focal liver abnormality is seen. No gallstones,
gallbladder wall thickening, or biliary dilatation.

Pancreas: Unremarkable. No pancreatic ductal dilatation or
surrounding inflammatory changes.

Spleen: Normal in size without focal abnormality.

Adrenals/Urinary Tract: Decreased size right renal cyst, now
measuring 2 cm with thin peripheral calcification. Previously this
had measured 4.8 cm. Left kidney is unremarkable.

Stable nodularity left adrenal gland compatible with adenoma based
on long-term stability. The right adrenal is normal.

No urinary tract calculi or obstruction. The bladder is
unremarkable.

Stomach/Bowel: No bowel obstruction or ileus. No bowel wall
thickening or inflammatory changes. Scattered colonic diverticulosis
without diverticulitis.

Vascular/Lymphatic: Aortic atherosclerosis. No enlarged abdominal or
pelvic lymph nodes.

Reproductive: Uterus and bilateral adnexa are unremarkable.

Other: Postsurgical changes from previous ventral hernia repair. No
free fluid or free intraperitoneal gas.

Musculoskeletal: No acute or destructive bony lesions. Reconstructed
images demonstrate no additional findings.
IMPRESSION: 1. No acute intra-abdominal process to explain the patient's
symptoms.
2. Decreased size of the right renal cyst, now measuring 2 cm with
thin peripheral calcification.
3. Stable left adrenal adenoma based on long-term stability.
4. Colonic diverticulosis without diverticulitis.
5. Aortic Atherosclerosis ([id number]).

## 2020-08-11 IMAGING — DX DG CHEST 2V
2 series · 2 of 2 positions shown · non-contrast
Comparison: 06/27/2016

CLINICAL DATA: Pain beneath the left breast and scapula the past 3
days.

EXAM:
CHEST - 2 VIEW

[chest pa]
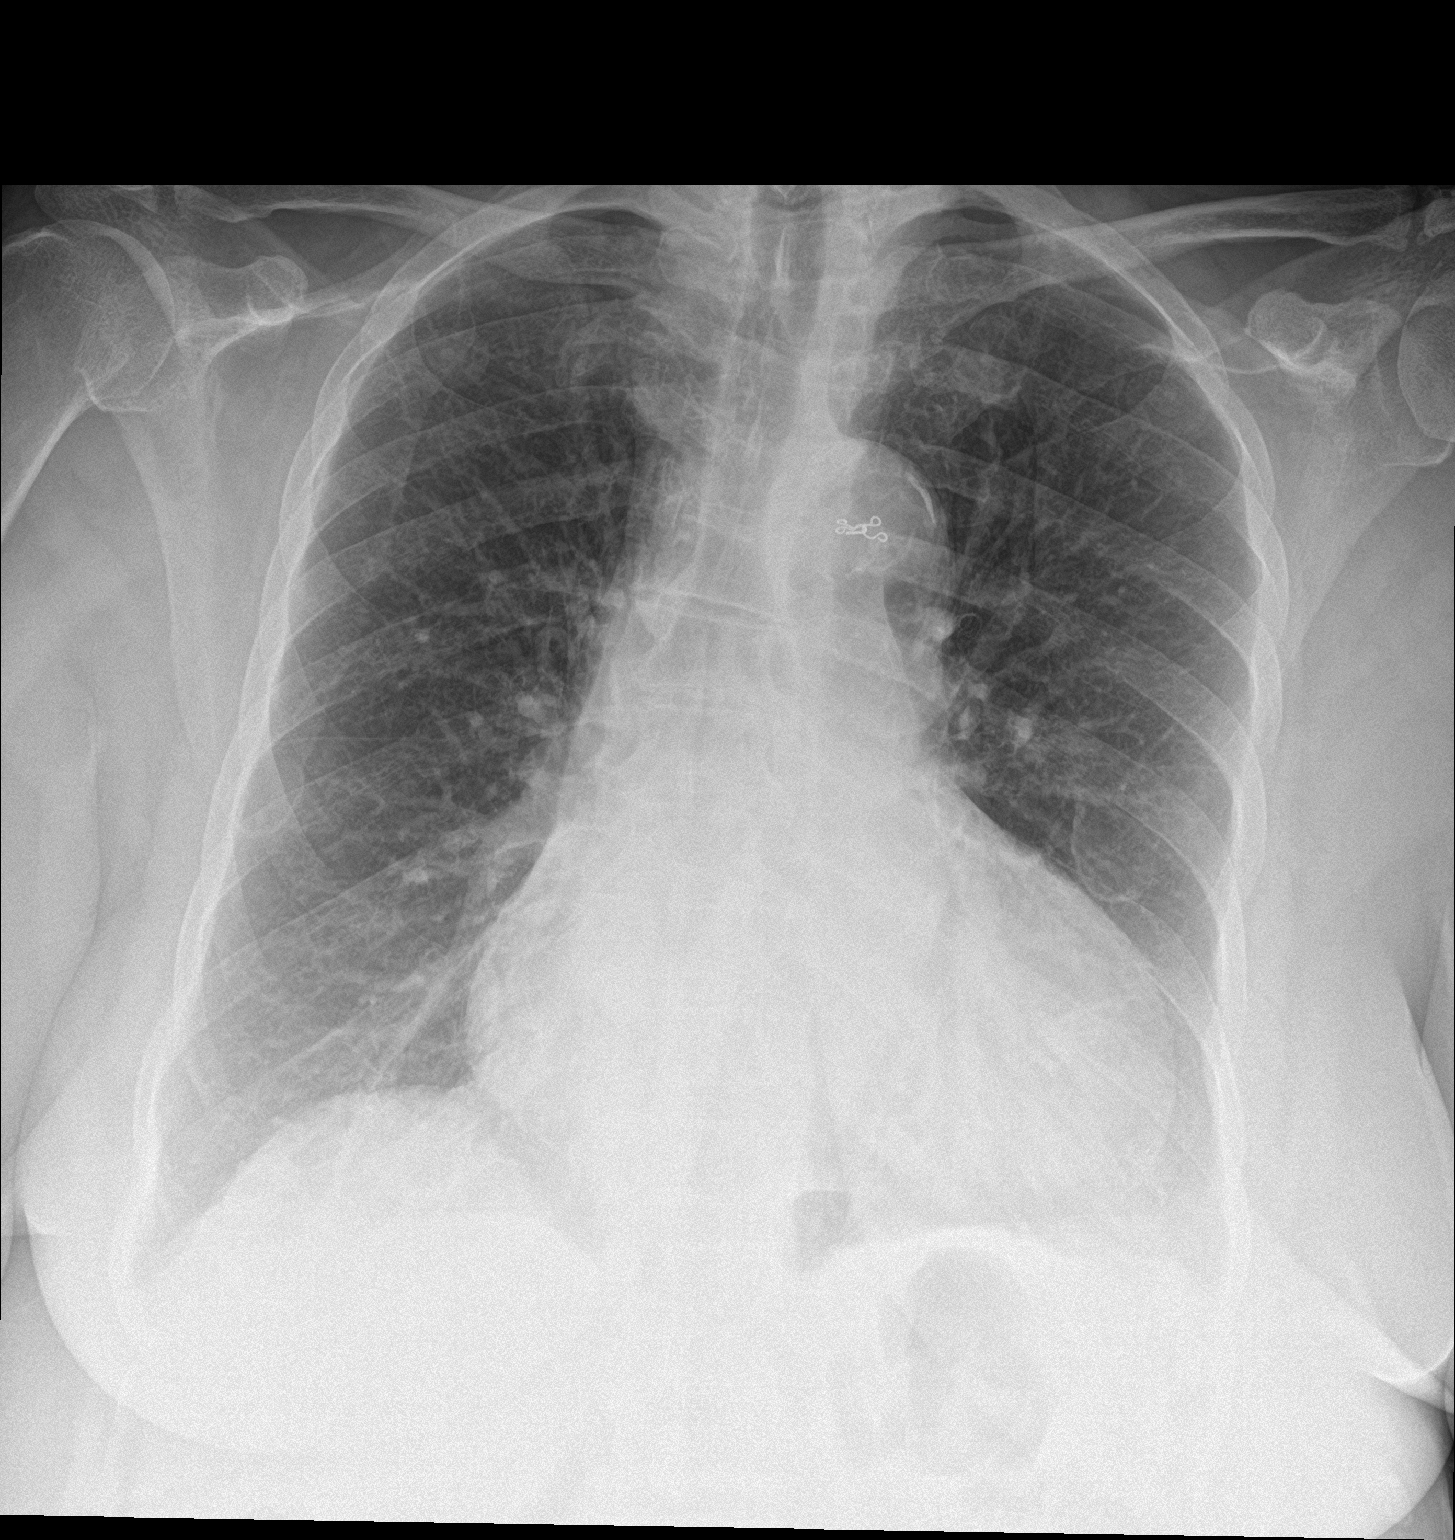

[chest lat]
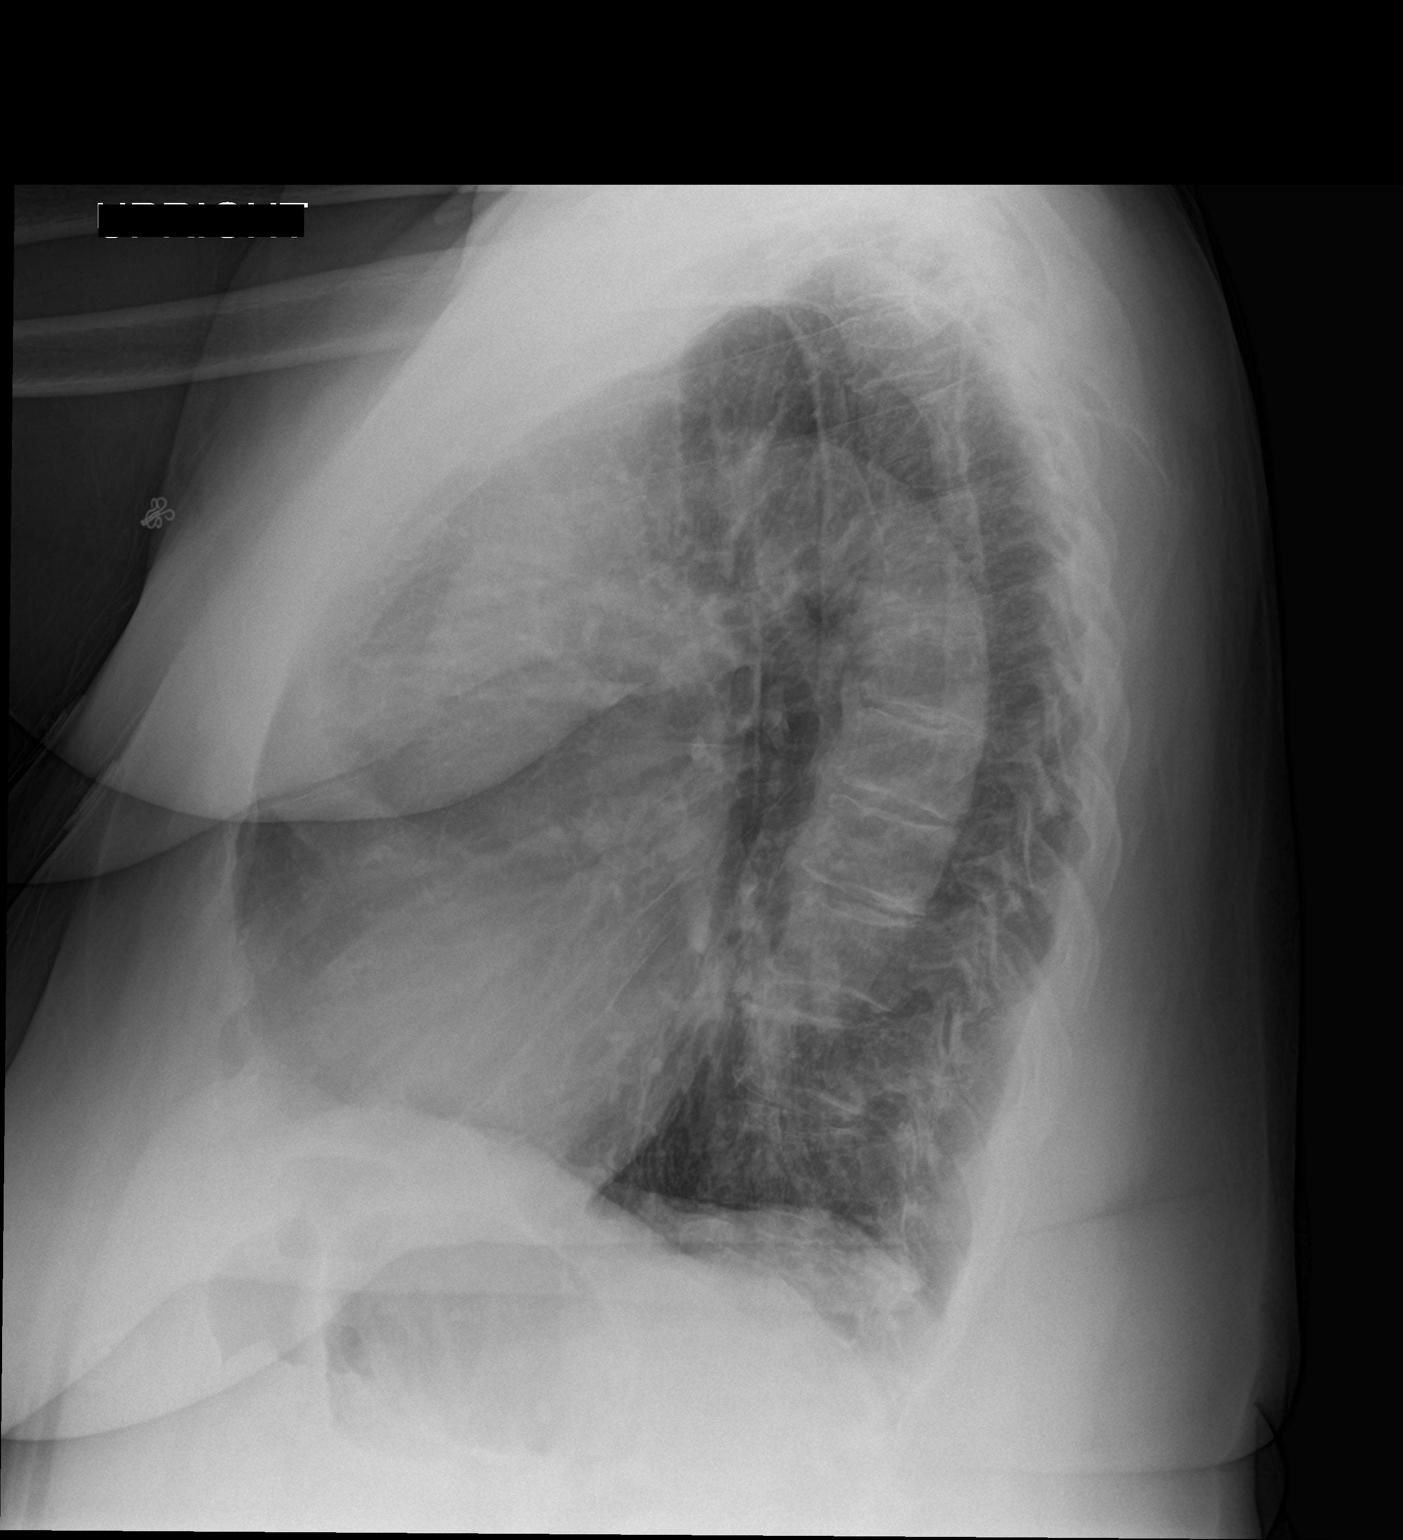

[2 of 2 positions shown; findings below may reference images not displayed]

FINDINGS: Enlarged cardiac silhouette with improvement. The aorta remains
tortuous and partially calcified. Improved depth of inspiration with
stable prominent pulmonary vasculature and mildly prominent
interstitial markings. The lungs remain hyper expression at the
lungs are hyperexpanded. No pleural fluid. Mild scoliosis.
IMPRESSION: 1. No acute abnormality.
2. Improved cardiomegaly.
3. COPD.

## 2020-09-25 NOTE — Progress Notes (Unsigned)
***  No chief complaint on file.  HPI  Megan Ryan is a 85 y.o. female who presents to the office with a chief complaint of " ***." Patient's past medical history and cardiac risk factors include: Hypertension, first degree AV block, postmenopausal female, obesity, advanced age.  Benign essential hypertension: Patient has been following up with the clinic for management of hypertension and is here for 30-month follow-up.  Patient states that she checks her blood pressure on a regular basis at home and systolic blood pressures range between 140-150 mmHg.  She tries to consume a low-salt diet.  No structured exercise program or daily routine.  She is able to do her activities of daily living at the age of 80.  She denies any chest pain at rest or with effort related activities.  Dyspnea on exertion is chronic and stable.  Overall euvolemic and not in congestive heart failure.  FUNCTIONAL STATUS: No structured exercise program or daily routine.  Patient ambulates with a cane.  ALLERGIES: No Known Allergies  MEDICATION LIST PRIOR TO VISIT: Current Outpatient Medications on File Prior to Visit  Medication Sig Dispense Refill  . amLODipine (NORVASC) 5 MG tablet Take 5 mg by mouth daily.    Marland Kitchen aspirin EC 81 MG tablet Take 81 mg by mouth daily.    . hydrochlorothiazide (HYDRODIURIL) 25 MG tablet Take 1 tablet (25 mg total) by mouth in the morning. 90 tablet 0   No current facility-administered medications on file prior to visit.    PAST MEDICAL HISTORY: Past Medical History:  Diagnosis Date  . Diabetes mellitus ([place]) 06/27/2016  . Diastolic HF (heart failure) ([place])   . Hypertension     PAST SURGICAL HISTORY: Past Surgical History:  Procedure Laterality Date  . HERNIA REPAIR      FAMILY HISTORY: The patient family history includes Breast cancer in her mother; Diabetes in her brother, father, mother, and sister; Prostate cancer in her brother.   SOCIAL HISTORY:  The patient   reports that she has never smoked. She has never used smokeless tobacco. She reports that she does not drink alcohol and does not use drugs.  14 ORGAN REVIEW OF SYSTEMS: Review of Systems  Constitutional: Negative for chills and fever.  HENT: Negative for hoarse voice and nosebleeds.   Eyes: Negative for discharge, double vision and pain.  Cardiovascular: Positive for dyspnea on exertion (chronic and stable.). Negative for chest pain, claudication, leg swelling, near-syncope, orthopnea, palpitations, paroxysmal nocturnal dyspnea and syncope.  Respiratory: Negative for hemoptysis and shortness of breath.   Musculoskeletal: Negative for muscle cramps and myalgias.  Gastrointestinal: Negative for abdominal pain, constipation, diarrhea, hematemesis, hematochezia, melena, nausea and vomiting.  Neurological: Negative for dizziness and light-headedness.   PHYSICAL EXAM: Vitals with BMI 06/28/2020 06/27/2020 06/27/2020  Height - - -  Weight - - -  BMI - - -  Systolic 035 009 381  Diastolic 65 72 72  Pulse 55 70 61   CONSTITUTIONAL: Well-developed and well-nourished. No acute distress.  SKIN: Skin is warm and dry. No rash noted. No cyanosis. No pallor. No jaundice HEAD: Normocephalic and atraumatic.  EYES: No scleral icterus MOUTH/THROAT: Moist oral membranes.  NECK: No JVD present. No thyromegaly noted. No carotid bruits  LYMPHATIC: No visible cervical adenopathy.  CHEST Normal respiratory effort. No intercostal retractions  LUNGS: Clear to auscultation bilaterally.  No stridor. No wheezes. No rales.  CARDIOVASCULAR: Regular rate and rhythm, positive W2-X9, soft systolic murmur heard at the left sternal  border, no gallops or rubs. ABDOMINAL: Obese, soft, nontender, nondistended, positive bowel sounds in all 4 quadrants.  No apparent ascites.  EXTREMITIES: Trace bilateral peripheral edema  HEMATOLOGIC: No significant bruising NEUROLOGIC: Oriented to person, place, and time. Nonfocal. Normal  muscle tone.  PSYCHIATRIC: Normal mood and affect. Normal behavior. Cooperative  CARDIAC DATABASE: EKG: 06/29/2020:Sinus rhythm with sinus arrhythmia with 1st degree AV block. Low voltage QRS. Cannot rule out anterior infarct age, undetermined inferior injury pattern.   Echocardiogram: 08/29/2019: LVEF 50-55%, no regional wall motion abnormalities, normal diastolic filling pattern, normal left atrial pressure, mild LVH, mild MR, mild TR, mild PR, small circumferential pericardial effusion with no hemodynamic compromise.   Stress Testing: Lexiscan Tetrofosmin Stress Test  09/02/2019: Nondiagnostic ECG stress. Daphragmatic attenuation noted. Myocardial perfusion is normal. Stress LV EF: 75%.  No previous exam available for comparison. Low risk study.   Heart Catheterization: None  LABORATORY DATA: CBC Latest Ref Rng & Units 06/27/2020 10/31/2019 12/27/2016  WBC 4.0 - 10.5 K/uL 6.0 4.2 4.5  Hemoglobin 12.0 - 15.0 g/dL 11.5(L) 12.0 12.2  Hematocrit 36.0 - 46.0 % 36.6 38.2 36.5  Platelets 150 - 400 K/uL 211 232 200    CMP Latest Ref Rng & Units 06/27/2020 04/06/2020 10/31/2019  Glucose 70 - 99 mg/dL 123(H) 97 87  BUN 8 - 23 mg/dL 15 18 19   Creatinine 0.44 - 1.00 mg/dL 0.78 0.91 0.75  Sodium 135 - 145 mmol/L 137 140 137  Potassium 3.5 - 5.1 mmol/L 3.7 4.2 3.3(L)  Chloride 98 - 111 mmol/L 100 101 99  CO2 22 - 32 mmol/L 26 24 27   Calcium 8.9 - 10.3 mg/dL 9.5 9.6 9.6  Total Protein 6.5 - 8.1 g/dL 8.5(H) - 8.5(H)  Total Bilirubin 0.3 - 1.2 mg/dL 0.4 - 0.9  Alkaline Phos 38 - 126 U/L 75 - 68  AST 15 - 41 U/L 20 - 27  ALT 0 - 44 U/L 14 - 21    Lipid Panel     Component Value Date/Time   CHOL 151 09/30/2019 0914   TRIG 59 09/30/2019 0914   HDL 42 09/30/2019 0914   LDLCALC 97 09/30/2019 0914   LABVLDL 12 09/30/2019 0914    Lab Results  Component Value Date   HGBA1C 6.4 (H) 09/30/2019   HGBA1C 6.2 (H) 06/28/2016   No components found for: NTPROBNP Lab Results  Component Value Date    TSH 1.111 06/28/2016   No recent blood work available for review.  FINAL MEDICATION LIST END OF ENCOUNTER: No orders of the defined types were placed in this encounter.   There are no discontinued medications.   Current Outpatient Medications:  .  amLODipine (NORVASC) 5 MG tablet, Take 5 mg by mouth daily., Disp: , Rfl:  .  aspirin EC 81 MG tablet, Take 81 mg by mouth daily., Disp: , Rfl:  .  hydrochlorothiazide (HYDRODIURIL) 25 MG tablet, Take 1 tablet (25 mg total) by mouth in the morning., Disp: 90 tablet, Rfl: 0  IMPRESSION:  No diagnosis found.   RECOMMENDATIONS: Benign essential hypertension: Improving  Office blood pressure are not at goal.   Blood pressures are currently not at goal either.  No blood pressure log to review.  Increase her hydrochlorothiazide to 25 mg p.o. every morning.  Recheck blood work in 1 week to reevaluate kidney function and electrolytes.  Low-salt diet recommended.  Dyspnea on exertion: Chronic and stable.   EKG performed, interpreted as noted above. Patient has undergone a recent ischemic evaluation  as noted above. Monitor for now.   First-degree AV block: Asymptomatic: Continue to monitor  Obesity, due to excess calories: There is no height or weight on file to calculate BMI. I reviewed with the patient the importance of diet, regular physical activity/exercise, weight loss.   Patient is educated on increasing physical activity gradually as tolerated.  With the goal of moderate intensity exercise for 30 minutes a day 5 days a week.  No orders of the defined types were placed in this encounter.  --Continue cardiac medications as reconciled in final medication list. --No follow-ups on file.. Or sooner if needed. --Continue follow-up with your primary care physician regarding the management of your other chronic comorbid conditions.  Patient's questions and concerns were addressed to her satisfaction. She voices understanding of the  instructions provided during this encounter.   This note was created using a voice recognition software as a result there may be grammatical errors inadvertently enclosed that do not reflect the nature of this encounter. Every attempt is made to correct such errors.  Rex Kras, [place], [facility]  Pager: [phone] Office: [phone]

## 2020-09-28 ENCOUNTER — Ambulatory Visit: Payer: Medicare HMO | Admitting: Cardiology

## 2020-09-28 ENCOUNTER — Other Ambulatory Visit: Payer: Self-pay

## 2020-09-28 ENCOUNTER — Encounter: Payer: Self-pay | Admitting: Cardiology

## 2020-09-28 VITALS — BP 155/81 | HR 85 | Temp 98.3°F | Resp 16 | Ht 67.0 in | Wt 227.0 lb

## 2020-09-28 DIAGNOSIS — E6609 Other obesity due to excess calories: Secondary | ICD-10-CM

## 2020-09-28 DIAGNOSIS — I10 Essential (primary) hypertension: Secondary | ICD-10-CM

## 2020-09-28 DIAGNOSIS — R0600 Dyspnea, unspecified: Secondary | ICD-10-CM

## 2020-09-28 DIAGNOSIS — I440 Atrioventricular block, first degree: Secondary | ICD-10-CM

## 2020-09-28 DIAGNOSIS — R0609 Other forms of dyspnea: Secondary | ICD-10-CM

## 2020-09-28 MED ORDER — HYDROCHLOROTHIAZIDE 25 MG PO TABS: 25.0000 mg | ORAL_TABLET | Freq: Every morning | ORAL | 0 refills | Status: DC

## 2020-09-28 NOTE — Progress Notes (Signed)
Date: 09/28/20 Last Office Visit: 03/27/2020  Chief Complaint  Patient presents with  . Hypertension  . Follow-up    6 month   HPI  Megan Ryan is a 85 y.o. female who presents to the office with a chief complaint of " 48-month follow-up for high blood pressure management." Patient's past medical history and cardiac risk factors include: Hypertension, first degree AV block, postmenopausal female, obesity, advanced age.  Benign essential hypertension: Patient presents for 22-month follow-up for management of high blood pressure.  Patient states that her home blood pressures range between 135-155 mmHg.  At last office visit the shared decision was to increase her hydrochlorothiazide to 25 mg p.o. every morning.  However, patient states that the medication was reduced down to 12.5 mg p.o. daily when it was refilled by her PCP.  She is currently on hydrochlorothiazide 12.5 mg p.o. every morning and amlodipine 5 mg p.o. every afternoon.  She tries to consume a low-salt diet.  Patient states that she does her activities of daily living and tries to walk as much as she can.  However no structured exercise program.  Since last office visit patient denies any chest pain or anginal equivalent.  No recent hospitalizations or urgent care visits for cardiovascular symptoms.  Her dyspnea on exertion is chronic and stable usually brought by over exertion.  FUNCTIONAL STATUS: No structured exercise program or daily routine.  Patient ambulates with a cane.  ALLERGIES: No Known Allergies  MEDICATION LIST PRIOR TO VISIT: Current Outpatient Medications on File Prior to Visit  Medication Sig Dispense Refill  . amLODipine (NORVASC) 5 MG tablet Take 10 mg by mouth daily.    Marland Kitchen aspirin EC 81 MG tablet Take 81 mg by mouth daily.     No current facility-administered medications on file prior to visit.    PAST MEDICAL HISTORY: Past Medical History:  Diagnosis Date  . Diabetes mellitus ([place]) 06/27/2016   . Diastolic HF (heart failure) ([place])   . Hypertension     PAST SURGICAL HISTORY: Past Surgical History:  Procedure Laterality Date  . HERNIA REPAIR      FAMILY HISTORY: The patient family history includes Breast cancer in her mother; Diabetes in her brother, father, mother, and sister; Prostate cancer in her brother.   SOCIAL HISTORY:  The patient  reports that she has never smoked. She has never used smokeless tobacco. She reports that she does not drink alcohol and does not use drugs.  14 ORGAN REVIEW OF SYSTEMS: Review of Systems  Constitutional: Negative for chills and fever.  HENT: Negative for hoarse voice and nosebleeds.   Eyes: Negative for discharge, double vision and pain.  Cardiovascular: Positive for dyspnea on exertion (chronic and stable.). Negative for chest pain, claudication, leg swelling, near-syncope, orthopnea, palpitations, paroxysmal nocturnal dyspnea and syncope.  Respiratory: Negative for hemoptysis and shortness of breath.   Musculoskeletal: Negative for muscle cramps and myalgias.  Gastrointestinal: Negative for abdominal pain, constipation, diarrhea, hematemesis, hematochezia, melena, nausea and vomiting.  Neurological: Negative for dizziness and light-headedness.   PHYSICAL EXAM: Vitals with BMI 09/28/2020 06/28/2020 06/27/2020  Height 5\' 7"  - -  Weight 227 lbs - -  BMI 62.13 - -  Systolic 086 578 469  Diastolic 81 65 72  Pulse 85 55 70   CONSTITUTIONAL: Well-developed and well-nourished. No acute distress.  SKIN: Skin is warm and dry. No rash noted. No cyanosis. No pallor. No jaundice HEAD: Normocephalic and atraumatic.  EYES: No scleral icterus MOUTH/THROAT: Moist  oral membranes.  NECK: No JVD present. No thyromegaly noted. No carotid bruits  LYMPHATIC: No visible cervical adenopathy.  CHEST Normal respiratory effort. No intercostal retractions  LUNGS: Clear to auscultation bilaterally.  No stridor. No wheezes. No rales.  CARDIOVASCULAR: Regular  rate and rhythm, positive T7-D2, soft systolic murmur heard at the left sternal border, no gallops or rubs. ABDOMINAL: Obese, soft, nontender, nondistended, positive bowel sounds in all 4 quadrants.  No apparent ascites.  EXTREMITIES: Trace bilateral peripheral edema  HEMATOLOGIC: No significant bruising NEUROLOGIC: Oriented to person, place, and time. Nonfocal. Normal muscle tone.  PSYCHIATRIC: Normal mood and affect. Normal behavior. Cooperative  CARDIAC DATABASE: EKG: 09/28/2020: Normal sinus rhythm, 75 bpm, low voltage in the limb leads, TWI anteroseptal leads suggestive of possible ischemia, without underlying injury pattern.  No significant change compared to prior ECG.   Echocardiogram: 08/29/2019: LVEF 50-55%, no regional wall motion abnormalities, normal diastolic filling pattern, normal left atrial pressure, mild LVH, mild MR, mild TR, mild PR, small circumferential pericardial effusion with no hemodynamic compromise.   Stress Testing: Lexiscan Tetrofosmin Stress Test  09/02/2019: Nondiagnostic ECG stress. Daphragmatic attenuation noted. Myocardial perfusion is normal. Stress LV EF: 75%.  No previous exam available for comparison. Low risk study.   Heart Catheterization: None  LABORATORY DATA: CBC Latest Ref Rng & Units 06/27/2020 10/31/2019 12/27/2016  WBC 4.0 - 10.5 K/uL 6.0 4.2 4.5  Hemoglobin 12.0 - 15.0 g/dL 11.5(L) 12.0 12.2  Hematocrit 36.0 - 46.0 % 36.6 38.2 36.5  Platelets 150 - 400 K/uL 211 232 200    CMP Latest Ref Rng & Units 06/27/2020 04/06/2020 10/31/2019  Glucose 70 - 99 mg/dL 123(H) 97 87  BUN 8 - 23 mg/dL 15 18 19   Creatinine 0.44 - 1.00 mg/dL 0.78 0.91 0.75  Sodium 135 - 145 mmol/L 137 140 137  Potassium 3.5 - 5.1 mmol/L 3.7 4.2 3.3(L)  Chloride 98 - 111 mmol/L 100 101 99  CO2 22 - 32 mmol/L 26 24 27   Calcium 8.9 - 10.3 mg/dL 9.5 9.6 9.6  Total Protein 6.5 - 8.1 g/dL 8.5(H) - 8.5(H)  Total Bilirubin 0.3 - 1.2 mg/dL 0.4 - 0.9  Alkaline Phos 38 - 126 U/L 75 -  68  AST 15 - 41 U/L 20 - 27  ALT 0 - 44 U/L 14 - 21    Lipid Panel     Component Value Date/Time   CHOL 151 09/30/2019 0914   TRIG 59 09/30/2019 0914   HDL 42 09/30/2019 0914   LDLCALC 97 09/30/2019 0914   LABVLDL 12 09/30/2019 0914    Lab Results  Component Value Date   HGBA1C 6.4 (H) 09/30/2019   HGBA1C 6.2 (H) 06/28/2016   No components found for: NTPROBNP Lab Results  Component Value Date   TSH 1.111 06/28/2016   No recent blood work available for review.  FINAL MEDICATION LIST END OF ENCOUNTER: Meds ordered this encounter  Medications  . hydrochlorothiazide (HYDRODIURIL) 25 MG tablet    Sig: Take 1 tablet (25 mg total) by mouth in the morning.    Dispense:  90 tablet    Refill:  0    Medications Discontinued During This Encounter  Medication Reason  . hydrochlorothiazide (HYDRODIURIL) 25 MG tablet Reorder     Current Outpatient Medications:  .  amLODipine (NORVASC) 5 MG tablet, Take 10 mg by mouth daily., Disp: , Rfl:  .  aspirin EC 81 MG tablet, Take 81 mg by mouth daily., Disp: , Rfl:  .  hydrochlorothiazide (HYDRODIURIL) 25 MG tablet, Take 1 tablet (25 mg total) by mouth in the morning., Disp: 90 tablet, Rfl: 0  IMPRESSION:    ICD-10-CM   1. Dyspnea on exertion  R06.00   2. Essential hypertension  I10 EKG 12-Lead    hydrochlorothiazide (HYDRODIURIL) 25 MG tablet    Basic metabolic panel    Magnesium  3. First degree AV block  I44.0   4. Class 2 obesity due to excess calories without serious comorbidity with body mass index (BMI) of 37.0 to 37.9 in adult  E66.09    Z68.37      RECOMMENDATIONS: Benign essential hypertension:   Office blood pressure are not at goal.   No blood pressure log to review.  Recommended increasing the hydrochlorothiazide to 25 mg p.o. every morning.  And increase Norvasc to 10 mg p.o. every afternoon.  Blood work in 1 week to reevaluate kidney function and electrolytes.  Blood pressure log recommended to see if  additional medication titration is warranted.  Low-salt diet recommended.  Dyspnea on exertion: Chronic and stable.   Continue to monitor for now.  First-degree AV block: Asymptomatic: Continue to monitor  Obesity, due to excess calories: Body mass index is 35.55 kg/m. I reviewed with the patient the importance of diet, regular physical activity/exercise, weight loss.   Patient is educated on increasing physical activity gradually as tolerated.  With the goal of moderate intensity exercise for 30 minutes a day 5 days a week.  Orders Placed This Encounter  Procedures  . Basic metabolic panel  . Magnesium  . EKG 12-Lead   --Continue cardiac medications as reconciled in final medication list. --Return in about 6 months (around 03/30/2021) for Follow up, BP.. Or sooner if needed. --Continue follow-up with your primary care physician regarding the management of your other chronic comorbid conditions.  Patient's questions and concerns were addressed to her satisfaction. She voices understanding of the instructions provided during this encounter.   This note was created using a voice recognition software as a result there may be grammatical errors inadvertently enclosed that do not reflect the nature of this encounter. Every attempt is made to correct such errors.  Rex Kras, [place], [facility]  Pager: [phone] Office: [phone]

## 2020-10-02 ENCOUNTER — Other Ambulatory Visit: Payer: Self-pay

## 2020-10-02 DIAGNOSIS — I10 Essential (primary) hypertension: Secondary | ICD-10-CM

## 2020-10-06 LAB — BASIC METABOLIC PANEL
BUN/Creatinine Ratio: 23 (ref 12–28)
BUN: 20 mg/dL (ref 8–27)
CO2: 25 mmol/L (ref 20–29)
Calcium: 10 mg/dL (ref 8.7–10.3)
Chloride: 100 mmol/L (ref 96–106)
Creatinine, Ser: 0.86 mg/dL (ref 0.57–1.00)
Glucose: 92 mg/dL (ref 65–99)
Potassium: 3.7 mmol/L (ref 3.5–5.2)
Sodium: 140 mmol/L (ref 134–144)
eGFR: 67 mL/min/{1.73_m2} (ref >59)

## 2020-10-06 LAB — MAGNESIUM: Magnesium: 2 mg/dL (ref 1.6–2.3)

## 2020-12-13 ENCOUNTER — Other Ambulatory Visit: Payer: Self-pay | Admitting: Cardiology

## 2020-12-13 DIAGNOSIS — I10 Essential (primary) hypertension: Secondary | ICD-10-CM

## 2021-01-14 ENCOUNTER — Other Ambulatory Visit: Payer: Self-pay | Admitting: Cardiology

## 2021-01-14 DIAGNOSIS — I10 Essential (primary) hypertension: Secondary | ICD-10-CM

## 2021-03-30 ENCOUNTER — Other Ambulatory Visit: Payer: Self-pay

## 2021-03-30 ENCOUNTER — Ambulatory Visit: Payer: Medicare HMO | Admitting: Cardiology

## 2021-03-30 ENCOUNTER — Encounter: Payer: Self-pay | Admitting: Cardiology

## 2021-03-30 VITALS — BP 132/71 | HR 84 | Temp 96.9°F | Resp 16 | Ht 67.0 in | Wt 224.2 lb

## 2021-03-30 DIAGNOSIS — I440 Atrioventricular block, first degree: Secondary | ICD-10-CM

## 2021-03-30 DIAGNOSIS — I10 Essential (primary) hypertension: Secondary | ICD-10-CM

## 2021-03-30 DIAGNOSIS — E6609 Other obesity due to excess calories: Secondary | ICD-10-CM

## 2021-03-30 NOTE — Progress Notes (Signed)
Date: 03/30/21 Last Office Visit: 09/28/2020   Chief Complaint  Patient presents with   Essential hypertension   Follow-up   HPI  Megan Ryan is a 85 y.o. female who presents to the office with a chief complaint of " 28-month follow-up for blood pressure management." Patient's past medical history and cardiac risk factors include: Hypertension, first degree AV block, postmenopausal female, obesity, advanced age.  Patient presents today for 69-month follow-up with regards to her blood pressure management.  Patient states that her blood pressures at home are very well controlled.  She is currently on hydrochlorothiazide in the morning and amlodipine at night.  At times patient states that she does get lightheaded without episodes of near syncope or syncope.  These episodes do not happen that often and usually self-limited and improved by increasing oral intake of fluids.  No hospitalizations or urgent care visits since last office encounter.  Patient states that her dyspnea on exertion which was thought to be chronic and stable is essentially resolved.  FUNCTIONAL STATUS: No structured exercise program or daily routine.  Patient ambulates with a cane.  ALLERGIES: No Known Allergies  MEDICATION LIST PRIOR TO VISIT: Current Outpatient Medications on File Prior to Visit  Medication Sig Dispense Refill   amLODipine (NORVASC) 5 MG tablet Take 10 mg by mouth daily.     aspirin EC 81 MG tablet Take 81 mg by mouth daily.     hydrochlorothiazide (HYDRODIURIL) 25 MG tablet TAKE 1 TABLET BY MOUTH IN THE MORNING 90 tablet 0   No current facility-administered medications on file prior to visit.    PAST MEDICAL HISTORY: Past Medical History:  Diagnosis Date   Diabetes mellitus ([facility]) 06/27/2016   Hypertension     PAST SURGICAL HISTORY: Past Surgical History:  Procedure Laterality Date   HERNIA REPAIR      FAMILY HISTORY: The patient family history includes Breast cancer in her  mother; Diabetes in her brother, father, mother, and sister; Prostate cancer in her brother.   SOCIAL HISTORY:  The patient  reports that she has never smoked. She has never used smokeless tobacco. She reports that she does not drink alcohol and does not use drugs.  14 ORGAN REVIEW OF SYSTEMS: Review of Systems  Constitutional: Negative for chills and fever.  HENT:  Negative for hoarse voice and nosebleeds.   Eyes:  Negative for discharge, double vision and pain.  Cardiovascular:  Negative for chest pain, claudication, dyspnea on exertion, leg swelling, near-syncope, orthopnea, palpitations, paroxysmal nocturnal dyspnea and syncope.  Respiratory:  Negative for hemoptysis and shortness of breath.   Musculoskeletal:  Negative for muscle cramps and myalgias.  Gastrointestinal:  Negative for abdominal pain, constipation, diarrhea, hematemesis, hematochezia, melena, nausea and vomiting.  Neurological:  Negative for dizziness and light-headedness.   PHYSICAL EXAM: Vitals with BMI 03/30/2021 03/30/2021 09/28/2020  Height - 5\' 7"  5\' 7"   Weight - 224 lbs 3 oz 227 lbs  BMI - 13.24 40.10  Systolic 272 536 644  Diastolic 71 82 81  Pulse 84 90 85   CONSTITUTIONAL: Well-developed and well-nourished. No acute distress.  SKIN: Skin is warm and dry. No rash noted. No cyanosis. No pallor. No jaundice HEAD: Normocephalic and atraumatic.  EYES: No scleral icterus MOUTH/THROAT: Moist oral membranes.  NECK: No JVD present. No thyromegaly noted. No carotid bruits  LYMPHATIC: No visible cervical adenopathy.  CHEST Normal respiratory effort. No intercostal retractions  LUNGS: Clear to auscultation bilaterally.  No stridor. No wheezes. No rales.  CARDIOVASCULAR: Regular rate and rhythm, positive E5-U3, soft systolic murmur heard at the left sternal border, no gallops or rubs. ABDOMINAL: Obese, soft, nontender, nondistended, positive bowel sounds in all 4 quadrants.  No apparent ascites.  EXTREMITIES: Trace  bilateral peripheral edema  HEMATOLOGIC: No significant bruising NEUROLOGIC: Oriented to person, place, and time. Nonfocal. Normal muscle tone.  PSYCHIATRIC: Normal mood and affect. Normal behavior. Cooperative  CARDIAC DATABASE: EKG: 03/30/2021: Normal sinus rhythm, 82 bpm, first-degree AV block, low voltage, TWI anterior anteroseptal leads suggestive of possible ischemia, without underlying injury pattern. No significant change compared to prior ECG.   Echocardiogram: 08/29/2019: LVEF 50-55%, no regional wall motion abnormalities, normal diastolic filling pattern, normal left atrial pressure, mild LVH, mild MR, mild TR, mild PR, small circumferential pericardial effusion with no hemodynamic compromise.   Stress Testing: Lexiscan Tetrofosmin Stress Test  09/02/2019: Nondiagnostic ECG stress. Daphragmatic attenuation noted. Myocardial perfusion is normal. Stress LV EF: 75%.  No previous exam available for comparison. Low risk study.   Heart Catheterization: None  LABORATORY DATA: CBC Latest Ref Rng & Units 06/27/2020 10/31/2019 12/27/2016  WBC 4.0 - 10.5 K/uL 6.0 4.2 4.5  Hemoglobin 12.0 - 15.0 g/dL 11.5(L) 12.0 12.2  Hematocrit 36.0 - 46.0 % 36.6 38.2 36.5  Platelets 150 - 400 K/uL 211 232 200    CMP Latest Ref Rng & Units 10/05/2020 06/27/2020 04/06/2020  Glucose 65 - 99 mg/dL 92 123(H) 97  BUN 8 - 27 mg/dL 20 15 18   Creatinine 0.57 - 1.00 mg/dL 0.86 0.78 0.91  Sodium 134 - 144 mmol/L 140 137 140  Potassium 3.5 - 5.2 mmol/L 3.7 3.7 4.2  Chloride 96 - 106 mmol/L 100 100 101  CO2 20 - 29 mmol/L 25 26 24   Calcium 8.7 - 10.3 mg/dL 10.0 9.5 9.6  Total Protein 6.5 - 8.1 g/dL - 8.5(H) -  Total Bilirubin 0.3 - 1.2 mg/dL - 0.4 -  Alkaline Phos 38 - 126 U/L - 75 -  AST 15 - 41 U/L - 20 -  ALT 0 - 44 U/L - 14 -    Lipid Panel     Component Value Date/Time   CHOL 151 09/30/2019 0914   TRIG 59 09/30/2019 0914   HDL 42 09/30/2019 0914   LDLCALC 97 09/30/2019 0914   LABVLDL 12  09/30/2019 0914    Lab Results  Component Value Date   HGBA1C 6.4 (H) 09/30/2019   HGBA1C 6.2 (H) 06/28/2016   No components found for: NTPROBNP Lab Results  Component Value Date   TSH 1.111 06/28/2016   No recent blood work available for review.  FINAL MEDICATION LIST END OF ENCOUNTER: No orders of the defined types were placed in this encounter.   There are no discontinued medications.    Current Outpatient Medications:    amLODipine (NORVASC) 5 MG tablet, Take 10 mg by mouth daily., Disp: , Rfl:    aspirin EC 81 MG tablet, Take 81 mg by mouth daily., Disp: , Rfl:    hydrochlorothiazide (HYDRODIURIL) 25 MG tablet, TAKE 1 TABLET BY MOUTH IN THE MORNING, Disp: 90 tablet, Rfl: 0  IMPRESSION:    ICD-10-CM   1. Essential hypertension  I10 EKG 12-Lead    2. First degree AV block  I44.0     3. Class 2 obesity due to excess calories without serious comorbidity with body mass index (BMI) of 35.0 to 35.9 in adult  E66.09    Z68.35        RECOMMENDATIONS: Essential hypertension  Office blood pressures within acceptable range. Medications reconciled. She consumes a low-salt diet Patient is lightheaded and dizziness is usually quickly self-limited.  However if the symptoms continue to occur on a regular basis she is advised to reduce hydrochlorothiazide to 12.5 mg p.o. every morning.  First degree AV block Chronic and stable Asymptomatic Not on AV nodal blocking agents.  Class 2 obesity due to excess calories without serious comorbidity with body mass index (BMI) of 35-35.9 in adult Body mass index is 35.11 kg/m. I reviewed with the patient the importance of diet, regular physical activity/exercise, weight loss.   Patient is educated on increasing physical activity gradually as tolerated.  With the goal of moderate intensity exercise for 30 minutes a day 5 days a week.  Patient has a 59-month follow-up EKG which shows sinus mechanism with T wave inversions in the anterior  precordial leads suggestive of possible ischemia.  These findings were noted on prior ECGs as well therefore appears to be stable.  She has undergone an ischemic evaluation in the recent past and her stress test was overall noted to be low risk.  Clinically she denies any chest pain or anginal equivalent.  Would recommend close follow-up.  Orders Placed This Encounter  Procedures   EKG 12-Lead   --Continue cardiac medications as reconciled in final medication list. --Return in about 6 months (around 09/28/2021) for Follow up, BP.. Or sooner if needed. --Continue follow-up with your primary care physician regarding the management of your other chronic comorbid conditions.  Patient's questions and concerns were addressed to her satisfaction. She voices understanding of the instructions provided during this encounter.   This note was created using a voice recognition software as a result there may be grammatical errors inadvertently enclosed that do not reflect the nature of this encounter. Every attempt is made to correct such errors.  Rex Kras, [place], [facility]  Pager: [phone] Office: [phone]

## 2021-06-11 ENCOUNTER — Other Ambulatory Visit: Payer: Self-pay | Admitting: Cardiology

## 2021-06-11 DIAGNOSIS — I10 Essential (primary) hypertension: Secondary | ICD-10-CM

## 2021-07-09 ENCOUNTER — Other Ambulatory Visit: Payer: Self-pay | Admitting: Internal Medicine

## 2021-07-10 LAB — BASIC METABOLIC PANEL WITH GFR
BUN/Creatinine Ratio: 19 (calc) (ref 6–22)
BUN: 20 mg/dL (ref 7–25)
CO2: 28 mmol/L (ref 20–32)
Calcium: 9.5 mg/dL (ref 8.6–10.4)
Chloride: 102 mmol/L (ref 98–110)
Creat: 1.06 mg/dL — ABNORMAL HIGH (ref 0.60–0.95)
Glucose, Bld: 78 mg/dL (ref 65–99)
Potassium: 4.3 mmol/L (ref 3.5–5.3)
Sodium: 139 mmol/L (ref 135–146)
eGFR: 52 mL/min/{1.73_m2} — ABNORMAL LOW (ref >=60)

## 2021-07-10 LAB — CBC WITH DIFFERENTIAL/PLATELET
Absolute Monocytes: 529 cells/uL (ref 200–950)
Basophils Absolute: 8 cells/uL (ref 0–200)
Basophils Relative: 0.2 %
Eosinophils Absolute: 42 cells/uL (ref 15–500)
Eosinophils Relative: 1 %
HCT: 34.7 % — ABNORMAL LOW (ref 35.0–45.0)
Hemoglobin: 11.2 g/dL — ABNORMAL LOW (ref 11.7–15.5)
Lymphs Abs: 979 cells/uL (ref 850–3900)
MCH: 30.1 pg (ref 27.0–33.0)
MCHC: 32.3 g/dL (ref 32.0–36.0)
MCV: 93.3 fL (ref 80.0–100.0)
MPV: 10.7 fL (ref 7.5–12.5)
Monocytes Relative: 12.6 %
Neutro Abs: 2642 cells/uL (ref 1500–7800)
Neutrophils Relative %: 62.9 %
Platelets: 217 10*3/uL (ref 140–400)
RBC: 3.72 10*6/uL — ABNORMAL LOW (ref 3.80–5.10)
RDW: 13.5 % (ref 11.0–15.0)
Total Lymphocyte: 23.3 %
WBC: 4.2 10*3/uL (ref 3.8–10.8)

## 2021-07-10 LAB — URINE CULTURE
MICRO NUMBER:: 12868742
SPECIMEN QUALITY:: ADEQUATE

## 2021-07-22 ENCOUNTER — Ambulatory Visit: Payer: Medicare HMO | Admitting: Podiatry

## 2021-07-22 ENCOUNTER — Other Ambulatory Visit: Payer: Self-pay

## 2021-07-22 ENCOUNTER — Encounter: Payer: Self-pay | Admitting: Podiatry

## 2021-07-22 DIAGNOSIS — L600 Ingrowing nail: Secondary | ICD-10-CM | POA: Diagnosis not present

## 2021-07-22 DIAGNOSIS — B351 Tinea unguium: Secondary | ICD-10-CM | POA: Diagnosis not present

## 2021-07-22 DIAGNOSIS — M79676 Pain in unspecified toe(s): Secondary | ICD-10-CM | POA: Diagnosis not present

## 2021-07-22 DIAGNOSIS — E119 Type 2 diabetes mellitus without complications: Secondary | ICD-10-CM | POA: Diagnosis not present

## 2021-07-22 MED ORDER — NEOMYCIN-POLYMYXIN-HC 1 % OT SOLN: OTIC | 1 refills | Status: DC

## 2021-07-22 NOTE — Patient Instructions (Signed)

## 2021-07-22 NOTE — Progress Notes (Signed)
°  Subjective:  Patient ID: Megan Ryan, female    DOB: 02/24/1936,  MRN: [id number] HPI Chief Complaint  Patient presents with   Nail Problem    2nd toenail right - tender tip and medial border, thick, dark, unable to cut well herself-trim other nails   Diabetes    Recently diagnosed - no meds yet   New Patient (Initial Visit)    86 y.o. female presents with the above complaint.   ROS: Denies fever chills nausea run muscle aches pains calf pain back pain chest pain shortness of breath.  Past Medical History:  Diagnosis Date   Diabetes mellitus ([place]) 06/27/2016   Hypertension    Past Surgical History:  Procedure Laterality Date   HERNIA REPAIR      Current Outpatient Medications:    NEOMYCIN-POLYMYXIN-HYDROCORTISONE (CORTISPORIN) 1 % SOLN OTIC solution, Apply 1-2 drops to toe BID after soaking, Disp: 10 mL, Rfl: 1   amLODipine (NORVASC) 5 MG tablet, Take 10 mg by mouth daily., Disp: , Rfl:    aspirin EC 81 MG tablet, Take 81 mg by mouth daily., Disp: , Rfl:    hydrochlorothiazide (HYDRODIURIL) 25 MG tablet, TAKE 1 TABLET BY MOUTH IN THE MORNING, Disp: 90 tablet, Rfl: 0  No Known Allergies Review of Systems Objective:  There were no vitals filed for this visit.  General: Well developed, nourished, in no acute distress, alert and oriented x3   Dermatological: Skin is warm, dry and supple bilateral. Nails x 10 are long thick yellow dystrophic with mycotic remaining integument appears unremarkable at this time. There are no open sores, no preulcerative lesions, no rash or signs of infection present.  Sharply rated nail margin along the tibial border of the second digit of the right foot.  No erythema cellulitis drainage or odor.  Mild hyperpigmentation on the nail border surrounding it.  Vascular: Dorsalis Pedis artery and Posterior Tibial artery pedal pulses are 2/4 bilateral with immedate capillary fill time. Pedal hair growth present. No varicosities and no lower extremity  edema present bilateral.   Neruologic: Grossly intact via light touch bilateral. Vibratory intact via tuning fork bilateral. Protective threshold with Semmes Wienstein monofilament intact to all pedal sites bilateral. Patellar and Achilles deep tendon reflexes 2+ bilateral. No Babinski or clonus noted bilateral.   Musculoskeletal: No gross boney pedal deformities bilateral. No pain, crepitus, or limitation noted with foot and ankle range of motion bilateral. Muscular strength 5/5 in all groups tested bilateral.  Gait: Unassisted, Nonantalgic.    Radiographs:  None taken  Assessment & Plan:   Assessment: Ingrown nail tibial border hallux right second right toenails are long thick yellow dystrophic clinical mycotic.   Plan: Chemical matricectomy was performed to the tibial border the second right.  Tolerated procedure well without complication was given both oral and written home-going structure for care and soaking of the toe as well as a prescription for Cortisporin Otic to be applied twice daily after soaking.  Tolerated procedure well and I will follow-up with her in 2 weeks.  Also debrided her elongated toenails today.     Cheryllynn Sarff T. [place], [place] right

## 2021-08-05 ENCOUNTER — Other Ambulatory Visit: Payer: Self-pay

## 2021-08-05 ENCOUNTER — Ambulatory Visit: Payer: Medicare HMO | Admitting: Podiatrist

## 2021-08-05 ENCOUNTER — Encounter: Payer: Self-pay | Admitting: Podiatrist

## 2021-08-05 DIAGNOSIS — L600 Ingrowing nail: Secondary | ICD-10-CM

## 2021-08-05 NOTE — Progress Notes (Signed)
Chief Complaint  Patient presents with   Nail Problem    Right foot 2nd toe nail causing some pain and discomfort      HPI: Patient is 86 y.o. female who presents today for a recheck of an ingrown nail procedure performed in the right second toe.  She states it still somewhat uncomfortable however it is much improved from the previous visit.  She has been soaking it as instructed and covering with antibiotic ointment and a Band-Aid.   No Known Allergies  Review of systems is negative except as noted in the HPI.  Denies nausea/ vomiting/ fevers/ chills or night sweats.   Denies difficulty breathing, denies calf pain or tenderness  Physical Exam  Patient is awake, alert, and oriented x 3.  In no acute distress.    Vascular status is intact with palpable pedal pulses DP and PT bilateral and capillary refill time less than 3 seconds bilateral.  No edema or erythema noted.   Neurological exam reveals epicritic and protective sensation grossly intact bilateral.   Dermatological excellent appearance of the right second toenail medial nail border is noted.  It does appear to be healing well.  Some slight yellow slough present which is removed today's visit without complication.  Musculoskeletal exam: Musculature intact with dorsiflexion, plantarflexion, inversion, eversion. Ankle and First MPJ joint range of motion normal.    Assessment:   ICD-10-CM   1. Ingrown nail of second toe of right foot  L60.0        Plan: I recommend that she continue to soak once a day in Epsom salt or vinegar water and then after this week she may stop.  She is also to leave open to air at night and cover during the day when she is up and on her feet.  This will go on to heal uneventfully.  However should she notice any increased redness, swelling or sign of infection she is to call immediately.  Otherwise she will be seen back as needed for follow-up.

## 2021-08-05 NOTE — Patient Instructions (Signed)
Continue soaks in epsom salt or vinegar- Once a day for the next 5-7 days.  You may start to leave the toe open to air when resting or at night.  Cover with antibiotic ointment and a bandaid when out and about or when wearing a shoe.  After a week, you may stop soaks and may stop using the bandage covering.    Call us if any increased pain, redness, or drainage is noticed.

## 2021-09-09 ENCOUNTER — Other Ambulatory Visit: Payer: Self-pay | Admitting: Cardiology

## 2021-09-09 DIAGNOSIS — I10 Essential (primary) hypertension: Secondary | ICD-10-CM

## 2021-09-17 ENCOUNTER — Other Ambulatory Visit: Payer: Self-pay | Admitting: Obstetrics and Gynecology

## 2021-09-17 ENCOUNTER — Emergency Department (HOSPITAL_COMMUNITY): Payer: Medicare HMO

## 2021-09-17 ENCOUNTER — Emergency Department (HOSPITAL_COMMUNITY)
Admission: EM | Admit: 2021-09-17 | Discharge: 2021-09-17 | Disposition: A | Discharge status: Home / Self Care | Payer: Medicare HMO | Attending: Emergency Medicine | Admitting: Emergency Medicine

## 2021-09-17 ENCOUNTER — Encounter (HOSPITAL_COMMUNITY): Payer: Self-pay

## 2021-09-17 ENCOUNTER — Other Ambulatory Visit: Payer: Self-pay

## 2021-09-17 DIAGNOSIS — E119 Type 2 diabetes mellitus without complications: Secondary | ICD-10-CM | POA: Insufficient documentation

## 2021-09-17 DIAGNOSIS — N888 Other specified noninflammatory disorders of cervix uteri: Secondary | ICD-10-CM

## 2021-09-17 DIAGNOSIS — C539 Malignant neoplasm of cervix uteri, unspecified: Secondary | ICD-10-CM | POA: Insufficient documentation

## 2021-09-17 DIAGNOSIS — Z7982 Long term (current) use of aspirin: Secondary | ICD-10-CM | POA: Diagnosis not present

## 2021-09-17 DIAGNOSIS — N939 Abnormal uterine and vaginal bleeding, unspecified: Secondary | ICD-10-CM

## 2021-09-17 DIAGNOSIS — I10 Essential (primary) hypertension: Secondary | ICD-10-CM | POA: Insufficient documentation

## 2021-09-17 DIAGNOSIS — Z79899 Other long term (current) drug therapy: Secondary | ICD-10-CM | POA: Insufficient documentation

## 2021-09-17 LAB — URINALYSIS, ROUTINE W REFLEX MICROSCOPIC
Bilirubin Urine: NEGATIVE
Glucose, UA: NEGATIVE mg/dL
Ketones, ur: NEGATIVE mg/dL
Nitrite: NEGATIVE
Protein, ur: NEGATIVE mg/dL
Specific Gravity, Urine: 1.016 (ref 1.005–1.030)
pH: 5 (ref 5.0–8.0)

## 2021-09-17 LAB — CBC
HCT: 36.9 % (ref 36.0–46.0)
Hemoglobin: 11.5 g/dL — ABNORMAL LOW (ref 12.0–15.0)
MCH: 30.3 pg (ref 26.0–34.0)
MCHC: 31.2 g/dL (ref 30.0–36.0)
MCV: 97.1 fL (ref 80.0–100.0)
Platelets: 208 10*3/uL (ref 150–400)
RBC: 3.8 MIL/uL — ABNORMAL LOW (ref 3.87–5.11)
RDW: 14.6 % (ref 11.5–15.5)
WBC: 4.8 10*3/uL (ref 4.0–10.5)
nRBC: 0 % (ref 0.0–0.2)

## 2021-09-17 LAB — COMPREHENSIVE METABOLIC PANEL
ALT: 14 U/L (ref 0–44)
AST: 16 U/L (ref 15–41)
Albumin: 3.1 g/dL — ABNORMAL LOW (ref 3.5–5.0)
Alkaline Phosphatase: 77 U/L (ref 38–126)
Anion gap: 5 (ref 5–15)
BUN: 16 mg/dL (ref 8–23)
CO2: 28 mmol/L (ref 22–32)
Calcium: 9.8 mg/dL (ref 8.9–10.3)
Chloride: 106 mmol/L (ref 98–111)
Creatinine, Ser: 0.77 mg/dL (ref 0.44–1.00)
GFR, Estimated: >60 mL/min (ref >60)
Glucose, Bld: 83 mg/dL (ref 70–99)
Potassium: 3.6 mmol/L (ref 3.5–5.1)
Sodium: 139 mmol/L (ref 135–145)
Total Bilirubin: 0.3 mg/dL (ref 0.3–1.2)
Total Protein: 8 g/dL (ref 6.5–8.1)

## 2021-09-17 LAB — POC OCCULT BLOOD, ED: Fecal Occult Bld: NEGATIVE

## 2021-09-17 LAB — TYPE AND SCREEN
ABO/RH(D): O NEG
Antibody Screen: NEGATIVE

## 2021-09-17 MED ORDER — IOHEXOL 350 MG/ML SOLN
100.0000 mL | Freq: Once | INTRAVENOUS | Status: AC | PRN
  Administered 2021-09-17: 100 mL via INTRAVENOUS

## 2021-09-17 NOTE — ED Triage Notes (Signed)
Pt arrived POV from home c/o bleeding. Pt states she is not sure if it is coming from her vagina or her rectum. Pt denies any pain but states it is a lot of blood so she is wearing a pad.  ?

## 2021-09-17 NOTE — Discharge Instructions (Signed)
You have a mass that appears to be coming from your cervix that is approximately 6 cm and looks like it involves your rectal wall and potentially her bladder as well.  This is extremely concerning for cervical cancer.  This mass is the cause of your bleeding but is not causing large amounts of dangerous bleeding.  Please continue to use a pad and if you start having heavier bleeding or feel like you are going to pass out return to the emergency department. ? ?You have been referred to the gynecologic oncology clinic and you should receive a phone call from them but you can also call to schedule close follow-up. ?

## 2021-09-17 NOTE — ED Provider Notes (Signed)
?[facility] ?Provider Note ? ? ?CSN: [id number] ?Arrival date & time: 09/17/21  1018 ? ?  ? ?History ? ?Chief Complaint  ?Patient presents with  ? Rectal Bleeding  ? ? ?Megan Ryan is a 86 y.o. female. ? ?Megan Ryan is a 86 y.o. female with hx of HTN and diabetes, who presents for evaluation of bleeding.  Patient reports for the past 2 to 3 weeks she has been noticed something bleeding that she sees on the pad she wears on her underwear and she reports that this seems to be worse overnight, but she is not sure where it is coming from.  She reports history of a previous hysterectomy many years ago and so I do not think it could be coming from the vagina but has not noticed blood in her stool specifically when she goes to the bathroom.  Patient reports she has not noted constant bleeding and denies rectal pain or abdominal pain.  She has not noted blood in her urine and denies urinary symptoms.  In addition to history of hysterectomy patient reports that she had a hernia repair and mesh placed and did not know if that could be contributing to symptoms.  Reports she has been having constipation but this is not uncommon for her, does think its been a bit worse than usual recently. ? ?The history is provided by the patient, a relative and medical records.  ? ?  ? ?Home Medications ?Prior to Admission medications   ?Medication Sig Start Date End Date Taking? Authorizing Provider  ?amLODipine (NORVASC) 5 MG tablet Take 10 mg by mouth daily. 07/06/19   [provider]  ?aspirin EC 81 MG tablet Take 81 mg by mouth daily.    [provider]  ?hydrochlorothiazide (HYDRODIURIL) 25 MG tablet TAKE 1 TABLET BY MOUTH IN THE MORNING ?Patient taking differently: Take 25 mg by mouth daily. 09/09/21   Tolia, Sunit, DO  ?NEOMYCIN-POLYMYXIN-HYDROCORTISONE (CORTISPORIN) 1 % SOLN OTIC solution Apply 1-2 drops to toe BID after soaking 07/22/21   Hyatt, Max T, DPM  ?    ? ?Allergies    ?Patient has no known allergies.   ? ?Review of Systems   ?Review of Systems  ?Constitutional:  Negative for chills and fever.  ?HENT: Negative.    ?Respiratory:  Negative for cough and shortness of breath.   ?Cardiovascular:  Negative for chest pain.  ?Gastrointestinal:  Positive for constipation and hematochezia. Negative for abdominal pain, diarrhea, nausea and vomiting.  ?Genitourinary:  Negative for dysuria, flank pain and frequency.  ?Musculoskeletal:  Negative for arthralgias and myalgias.  ?Neurological:  Negative for dizziness, syncope and light-headedness.  ?All other systems reviewed and are negative. ? ?Physical Exam ?Updated Vital Signs ?BP (!) 149/89 (BP Location: Right Arm)   Pulse 63   Temp 98.2 ?F (36.8 ?C) (Oral)   Resp 15   Ht '5\' 7"'$  (1.702 m)   Wt 92.1 kg   SpO2 100%   BMI 31.79 kg/m?  ?Physical Exam ?Vitals and nursing note reviewed.  ?Constitutional:   ?   General: She is not in acute distress. ?   Appearance: Normal appearance. She is well-developed. She is not diaphoretic.  ?HENT:  ?   Head: Normocephalic and atraumatic.  ?Eyes:  ?   General:     ?   Right eye: No discharge.     ?   Left eye: No discharge.  ?   Pupils: Pupils are equal, round, and  reactive to light.  ?Cardiovascular:  ?   Rate and Rhythm: Normal rate and regular rhythm.  ?   Pulses: Normal pulses.  ?   Heart sounds: Normal heart sounds.  ?Pulmonary:  ?   Effort: Pulmonary effort is normal. No respiratory distress.  ?   Breath sounds: Normal breath sounds. No wheezing or rales.  ?   Comments: Respirations equal and unlabored, patient able to speak in full sentences, lungs clear to auscultation bilaterally  ?Abdominal:  ?   General: Bowel sounds are normal. There is no distension.  ?   Palpations: Abdomen is soft. There is no mass.  ?   Tenderness: There is no abdominal tenderness. There is no guarding.  ?   Comments: Abdomen soft, nondistended, nontender to palpation in all quadrants without guarding or  peritoneal signs  ?Genitourinary: ?   Comments: Chaperone present during exam. ?On digital rectal exam there is no hemorrhoids, fissures or external signs of bleeding, on internal exam there is normal rectal tone and a palpable soft tissue mass within the anterior rectal wall, no palpable hemorrhoids and no gross blood noted, soft brown stool. ?On speculum exam there is a irregular mass within the vaginal canal that appears to be coming from the cervix, tissue is friable with small amounts of bleeding, no discharge ?Musculoskeletal:     ?   General: No deformity.  ?   Cervical back: Neck supple.  ?Skin: ?   General: Skin is warm and dry.  ?   Capillary Refill: Capillary refill takes less than 2 seconds.  ?Neurological:  ?   Mental Status: She is alert and oriented to person, place, and time.  ?   Coordination: Coordination normal.  ?   Comments: Speech is clear, able to follow commands ?Moves extremities without ataxia, coordination intact  ?Psychiatric:     ?   Mood and Affect: Mood normal.     ?   Behavior: Behavior normal.  ? ? ?ED Results / Procedures / Treatments   ?Labs ?(all labs ordered are listed, but only abnormal results are displayed) ?Labs Reviewed  ?URINE CULTURE - Abnormal; Notable for the following components:  ?    Result Value  ? Culture   (*)   ? Value: <10,000 COLONIES/mL INSIGNIFICANT GROWTH ?Performed at [facility], [place] [address] [place], [place] [address] ?  ? All other components within normal limits  ?COMPREHENSIVE METABOLIC PANEL - Abnormal; Notable for the following components:  ? Albumin 3.1 (*)   ? All other components within normal limits  ?CBC - Abnormal; Notable for the following components:  ? RBC 3.80 (*)   ? Hemoglobin 11.5 (*)   ? All other components within normal limits  ?URINALYSIS, ROUTINE W REFLEX MICROSCOPIC - Abnormal; Notable for the following components:  ? APPearance HAZY (*)   ? Hgb urine dipstick SMALL (*)   ? Leukocytes,Ua MODERATE (*)   ? Bacteria, UA FEW  (*)   ? Non Squamous Epithelial 6-10 (*)   ? All other components within normal limits  ?POC OCCULT BLOOD, ED  ?TYPE AND SCREEN  ? ? ?EKG ?None ? ?Radiology ?CT ABDOMEN PELVIS W CONTRAST ? ?Result Date: 09/17/2021 ?CLINICAL DATA:  GI bleeding.  Palpable rectal mass on physical exam. EXAM: CT ABDOMEN AND PELVIS WITH CONTRAST TECHNIQUE: Multidetector CT imaging of the abdomen and pelvis was performed using the standard protocol following bolus administration of intravenous contrast. RADIATION DOSE REDUCTION: This exam was performed according to the  departmental dose-optimization program which includes automated exposure control, adjustment of the mA and/or kV according to patient size and/or use of iterative reconstruction technique. CONTRAST:  130m OMNIPAQUE IOHEXOL 350 MG/ML SOLN COMPARISON:  06/28/2020 FINDINGS: Lower Chest: No acute findings. Hepatobiliary: No hepatic masses identified. Gallbladder is unremarkable. No evidence of biliary ductal dilatation. Pancreas:  No mass or inflammatory changes. Spleen: Within normal limits in size and appearance. Adrenals/Urinary Tract: Stable 1.3 cm left adrenal mass, consistent with benign adenoma. A few tiny renal cysts are again noted. No masses identified. No evidence of ureteral calculi or hydronephrosis. Stomach/Bowel: No evidence of obstruction, inflammatory process or abnormal fluid collections. Vascular/Lymphatic: No pathologically enlarged lymph nodes. No acute vascular findings. Reproductive: A few tiny calcified uterine fibroids are again seen. A ill-defined soft tissue mass is seen which is centered in the region of the cervix and upper vagina. This mass abuts the inferior rectum and urinary bladder, and invasion of these adjacent organs cannot be excluded. This measures 5.0 x 3.8 cm on image 68/3, and is new since prior exam. This is suspicious for cervical carcinoma. Other: Surgical mesh noted in the anterior abdominal wall. No evidence of recurrent ventral  hernia. Musculoskeletal:  No suspicious bone lesions identified. IMPRESSION: New 6 cm ill-defined soft tissue mass centered in the region of the cervix and upper vagina, with possible involvement of the rectum and/or bladde

## 2021-09-17 NOTE — ED Notes (Signed)
Pt verbalized understanding of d/c instructions, meds, and followup care. Denies questions. VSS, no distress noted. Assisted to wheelchair and out to lobby with family. ?

## 2021-09-19 LAB — URINE CULTURE: Culture: <10000 — AB

## 2021-09-20 ENCOUNTER — Telehealth: Payer: Self-pay

## 2021-09-20 NOTE — Telephone Encounter (Signed)
New patient appointment has been scheduled with Dr. Berline Lopes on 09/27/21 at 0945. Patient agrees to date and time. She has been provided with office address and location. She is also aware of our mask and visitor policy. She verbalized understanding and will call with any questions.    ?

## 2021-09-24 ENCOUNTER — Encounter: Payer: Self-pay | Admitting: Gynecologic Oncology

## 2021-09-27 ENCOUNTER — Inpatient Hospital Stay: Payer: Medicare HMO | Attending: Gynecologic Oncology | Admitting: Gynecologic Oncology

## 2021-09-27 ENCOUNTER — Other Ambulatory Visit: Payer: Self-pay

## 2021-09-27 ENCOUNTER — Encounter: Payer: Self-pay | Admitting: Gynecologic Oncology

## 2021-09-27 ENCOUNTER — Encounter: Payer: Self-pay | Admitting: Oncology

## 2021-09-27 VITALS — BP 146/72 | HR 73 | Temp 99.1°F | Resp 16 | Ht 65.5 in | Wt 218.3 lb

## 2021-09-27 DIAGNOSIS — Z79899 Other long term (current) drug therapy: Secondary | ICD-10-CM | POA: Diagnosis not present

## 2021-09-27 DIAGNOSIS — R32 Unspecified urinary incontinence: Secondary | ICD-10-CM | POA: Diagnosis not present

## 2021-09-27 DIAGNOSIS — D500 Iron deficiency anemia secondary to blood loss (chronic): Secondary | ICD-10-CM | POA: Diagnosis not present

## 2021-09-27 DIAGNOSIS — K5900 Constipation, unspecified: Secondary | ICD-10-CM | POA: Insufficient documentation

## 2021-09-27 DIAGNOSIS — Z7982 Long term (current) use of aspirin: Secondary | ICD-10-CM | POA: Insufficient documentation

## 2021-09-27 DIAGNOSIS — N888 Other specified noninflammatory disorders of cervix uteri: Secondary | ICD-10-CM

## 2021-09-27 DIAGNOSIS — I10 Essential (primary) hypertension: Secondary | ICD-10-CM | POA: Diagnosis not present

## 2021-09-27 DIAGNOSIS — E119 Type 2 diabetes mellitus without complications: Secondary | ICD-10-CM | POA: Insufficient documentation

## 2021-09-27 DIAGNOSIS — Z90722 Acquired absence of ovaries, bilateral: Secondary | ICD-10-CM | POA: Insufficient documentation

## 2021-09-27 DIAGNOSIS — C539 Malignant neoplasm of cervix uteri, unspecified: Secondary | ICD-10-CM | POA: Diagnosis not present

## 2021-09-27 DIAGNOSIS — N950 Postmenopausal bleeding: Secondary | ICD-10-CM

## 2021-09-27 DIAGNOSIS — R42 Dizziness and giddiness: Secondary | ICD-10-CM | POA: Insufficient documentation

## 2021-09-27 DIAGNOSIS — R634 Abnormal weight loss: Secondary | ICD-10-CM | POA: Insufficient documentation

## 2021-09-27 DIAGNOSIS — I440 Atrioventricular block, first degree: Secondary | ICD-10-CM | POA: Insufficient documentation

## 2021-09-27 DIAGNOSIS — Z90710 Acquired absence of both cervix and uterus: Secondary | ICD-10-CM | POA: Diagnosis not present

## 2021-09-27 DIAGNOSIS — C52 Malignant neoplasm of vagina: Secondary | ICD-10-CM | POA: Insufficient documentation

## 2021-09-27 NOTE — Progress Notes (Signed)
Met with Rabab and her daughter and went over the plan for a PET scan and appointments with Dr. Sondra Come and Dr. Alvy Bimler.  Provided her with the [employer] and encouraged her to call with any questions or needs. ?

## 2021-09-27 NOTE — Patient Instructions (Signed)
It was a pleasure to meet you today. ? ?Based on my exam, you have a cancer either originating in the vagina or the cervix that is extending along most of the length of the left side of the vagina.  While I can feel it on rectal exam, I do not feel that it has grown into the rectum itself. ? ?We will have some biopsies back by mid to later this week.  I will let you know once I have the biopsy results. ? ?We will get a PET scan to evaluate for distant metastatic disease.  There was no definitive evidence of metastatic disease on your recent CT scan. ? ?I would like you to meet with both our radiation oncologist as well as her medical oncologist.  The treatment for your cancer would be combination radiation with chemotherapy to help the radiation work better.  If you ultimately decide not to pursue treatment, I would still like you to have a conversation with our radiation oncologist about some palliative radiation (either now or in the future) to decrease the risk of significant bleeding. ?

## 2021-09-27 NOTE — Progress Notes (Signed)
GYNECOLOGIC ONCOLOGY NEW PATIENT CONSULTATION  ? ?Patient Name: Megan Ryan  ?Patient Age: 86 y.o. ?Date of Service: 09/27/2021 ?Referring Provider: Dr. Harrell Gave Tegeler ? ?Primary Care Provider: [name], MD ?Consulting Provider: Jeral Pinch, MD  ? ?Assessment/Plan:  ?Postmenopausal patient with at least stage II vaginal cancer versus stage IIIA cervix cancer. ? ?I discussed findings on my exam today with both the patient and her daughter.  Additionally, we reviewed her recent CT scan, which shows a mass within the vagina and concern for possible rectal as well as bladder invasion.  On exam today, I can appreciate the mass up to the rectum but not through the mucosa.  The patient has a history of hysterectomy.  Although I cannot palpate or visualize any normal cervix, the entire apex of the vagina as well as much of the left side of the vagina is replaced with tumor.  Thus, I suspect that this is either primary vaginal cancer or that she had a supracervical hysterectomy and that this represents locally advanced cervix cancer. ? ?Biopsy was taken today for tissue diagnosis.  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 ? ?Patient has had some dizziness, although not significantly worse recently.  Her last CBC showed a hemoglobin of 11.5.  The patient is not having significant vaginal bleeding. ? ?Although there is not evidence of metastatic disease outside of the pelvis, I recommended that we proceed with PET scan as we finalize treatment recommendations.  Given at least locally advanced vaginal versus cervical cancer, I discussed the reasons why surgery is not recommended.  In this setting, we would use radiation with sensitizing chemotherapy. ? ?Today, the patient voiced not being interested in radiation or chemotherapy.  Given her age and story she has heard about others experiences with these types of treatments, her preference would be to forego treatment.  She and I discussed  the natural progression of disease and that given the tumors size and location, it is very possible that she will develop significant bleeding in the near future and/or invasion into the rectum.  I encouraged her to meet with both her radiation and medical oncologist to hear more about treatment before making her decision.  Even if she foregoes treatment with the intent of cure, it may be worth considering, either now or in the near future, palliative radiation to decrease the risk of having significant bleeding. ? ?Patient was scheduled for a PET scan.  She will be scheduled to see both radiation oncology as well as medical oncology after her imaging. ? ?I will call her once I have the biopsy results. ? ?A copy of this note was sent to the patient's referring provider.  ? ?60 minutes of total time was spent for this patient encounter, including preparation, face-to-face counseling with the patient and coordination of care, and documentation of the encounter. ? ? ?Jeral Pinch, MD  ?Division of Gynecologic Oncology  ?Department of Obstetrics and Gynecology  ?[facility]  ?___________________________________________  ?Chief Complaint: ?Chief Complaint  ?Patient presents with  ? Cervical mass  ? ? ?History of Present Illness:  ?Megan Ryan is a 86 y.o. y.o. female who is seen in consultation at the request of Dr. Sherry Ruffing for an evaluation of postmenopausal bleeding, vaginal mass. ? ?The patient initially presented on 3/24 with several weeks of postmenopausal bleeding.  Given history of a hysterectomy, she was unsure whether this was vaginal bleeding or rectal bleeding.  Denied any hematuria or blood in  her stool.  She also endorsed constipation at the time.  Pelvic exam was notable for an irregular mass within the vaginal canal with friable tissue.  Palpable soft tissue mass felt within the anterior rectal wall on rectovaginal exam.  CT abdomen performed during her emergency  department visit showed an ill-defined soft tissue mass within the region of the cervix and upper vagina.  The mass abuts the inferior rectum and urinary bladder and invasion into the adjacent organs cannot be excluded.  Mass measures 5 x 3.8 cm and new since prior exam (last CT scan was in 06/2020). ? ?Today, the patient comes in with her daughter, Megan Ryan.  She notes overall doing well although has had about a month of vaginal bleeding.  She wears a pad for urinary incontinence and changes it twice a day.  She notes that most of the time there is just a streak of blood on the pad.  She denies any associated clots, pain, or cramping.  She denies any urinary symptoms.  She saw her primary care provider shortly after the bleeding started.  She was unsure at that time if it was coming from her vagina or from her rectum.  She notes being treated with antibiotics for a presumed urinary tract infection although did not have any change in her symptoms.  She denies any vaginal discharge. ? ?Patient has about a year worth of dizziness and lightheadedness intermittently.  She walks with a cane.  Denies any recent falls.  She endorses some urinary incontinence.  Has constipation at baseline, takes MiraLAX with relief.  She notes appetite has been up and down, has occasional nausea, denies emesis.  She has lost about 40 pounds over the last year.  Initially states that she was not trying to, but her primary care provider put her on a diet with which she has been mostly compliant. ? ?Patient sees a cardiologist regularly.  This is given a history of hypertension, first-degree AV block.  She has continued dyspnea on exertion, no change recently.  Denies any chest pain.  Patient surgical history is notable for total hysterectomy with removal of her tubes and ovaries per her report.  She does not remember why she had the surgery.  She denies any menopausal symptoms at the time of her hysterectomy or subsequently.  Does not  remember ever being on HRT.  Does not remember indication for her hysterectomy.  She had more recent surgery for incisional hernia with mesh placement. ? ?The patient is retired.  She worked as a Garment/textile technologist previously. ? ?PAST MEDICAL HISTORY:  ?Past Medical History:  ?Diagnosis Date  ? Arthritis   ? knee and back pain  ? Diabetes mellitus ([place]) 06/27/2016  ? Hypertension   ?  ? ?PAST SURGICAL HISTORY:  ?Past Surgical History:  ?Procedure Laterality Date  ? HERNIA REPAIR  2003  ? repair with mesh  ? HYSTERECTOMY ABDOMINAL WITH SALPINGO-OOPHORECTOMY  1960  ? doesn't remember why, doesn't remember being on HRT (never had menopausal symptoms)  ? ? ?OB/GYN HISTORY:  ?OB History  ?Gravida Para Term Preterm AB Living  ?2 2       2   ?SAB IAB Ectopic Multiple Live Births  ?           ?  ?# Outcome Date GA Lbr Len/2nd Weight Sex Delivery Anes PTL Lv  ?2 Para           ?1 Para           ? ? ?  No LMP recorded. Patient has had a hysterectomy. ? ?Age at menarche: 31 ?Age at menopause: Patient is unsure ?Hx of HRT: Denies ?Hx of STDs: Denies ?Last pap: Unsure ?History of abnormal pap smears: Denies ? ?SCREENING STUDIES:  ?Last mammogram: 2006  ?Last colonoscopy: Does not remember ? ?MEDICATIONS: ?Outpatient Encounter Medications as of 09/27/2021  ?Medication Sig  ? acetaminophen (TYLENOL) 325 MG tablet Take 650 mg by mouth as needed.  ? amLODipine (NORVASC) 5 MG tablet Take 10 mg by mouth daily.  ? aspirin EC 81 MG tablet Take 81 mg by mouth daily.  ? hydrochlorothiazide (HYDRODIURIL) 25 MG tablet TAKE 1 TABLET BY MOUTH IN THE MORNING (Patient taking differently: Take 25 mg by mouth daily.)  ? NEOMYCIN-POLYMYXIN-HYDROCORTISONE (CORTISPORIN) 1 % SOLN OTIC solution Apply 1-2 drops to toe BID after soaking  ? ?No facility-administered encounter medications on file as of 09/27/2021.  ? ? ?ALLERGIES:  ?No Known Allergies  ? ?FAMILY HISTORY:  ?Family History  ?Problem Relation Age of Onset  ? Diabetes Mother   ? Diabetes Father   ? Breast  cancer Sister   ? Diabetes Sister   ? Cancer Sister   ?     thinks may have had colon or stomach cancer  ? Prostate cancer Brother   ? Diabetes Brother   ? Cancer Brother   ?     Liver cancer  ? CAD Neg Hx   ? Colon c

## 2021-09-28 ENCOUNTER — Ambulatory Visit: Payer: Medicare HMO | Admitting: Cardiology

## 2021-09-28 LAB — SURGICAL PATHOLOGY

## 2021-10-02 ENCOUNTER — Encounter: Payer: Self-pay | Admitting: Hematology and Oncology

## 2021-10-02 DIAGNOSIS — C539 Malignant neoplasm of cervix uteri, unspecified: Secondary | ICD-10-CM

## 2021-10-02 DIAGNOSIS — C52 Malignant neoplasm of vagina: Secondary | ICD-10-CM | POA: Insufficient documentation

## 2021-10-02 HISTORY — DX: Malignant neoplasm of cervix uteri, unspecified: C53.9

## 2021-10-04 ENCOUNTER — Other Ambulatory Visit: Payer: Self-pay | Admitting: Oncology

## 2021-10-04 ENCOUNTER — Telehealth: Payer: Self-pay | Admitting: Oncology

## 2021-10-04 NOTE — Telephone Encounter (Signed)
Called Steffi and rescheduled new patient appointment with Dr. Alvy Bimler for tomorrow, 10/05/21 for 1:00 with 12:30 arrival. She verbalized understanding and agreement. ?

## 2021-10-04 NOTE — Progress Notes (Signed)
Gynecologic Oncology Multi-Disciplinary Disposition Conference Note ? ?Date of the Conference: 10/04/2021 ? ?Patient Name: Megan Ryan  ?Referring Provider: Dr. Sherry Ruffing ?Primary GYN Oncologist: Dr. Berline Lopes ? ?Stage/Disposition:  At least stage II vaginal cancer versus stage IIIA cervix cancer. Disposition is to PET scan followed by chemotherapy and radiation versus palliative radiation.  Also, PD-L1 testing has been requested.  ? ?This Multidisciplinary conference took place involving physicians from [place], Medical Oncology, Radiation Oncology, Pathology, Radiology along with the Gynecologic Oncology Nurse Practitioner and Gynecologic Oncology Nurse Navigator.  Comprehensive assessment of the patient's malignancy, staging, need for surgery, chemotherapy, radiation therapy, and need for further testing were reviewed. Supportive measures, both inpatient and following discharge were also discussed. The recommended plan of care is documented. Greater than 35 minutes were spent correlating and coordinating this patient's care.  ?

## 2021-10-05 ENCOUNTER — Inpatient Hospital Stay: Payer: Medicare HMO | Admitting: Hematology and Oncology

## 2021-10-05 ENCOUNTER — Telehealth: Payer: Self-pay | Admitting: Oncology

## 2021-10-05 NOTE — Telephone Encounter (Signed)
Left a message regarding rescheduling appointment today with Dr. Alvy Bimler to 10/14/21 at 2:00 due to transportation.  Requested a return call to confirm new appointment. ?

## 2021-10-05 NOTE — Progress Notes (Signed)
GYN Location of Tumor / Histology: Cervical ? ?Hal Morales presented with symptoms of: bleeding ? ?Biopsies revealed:  ?A. VAGINAL SIDEWALL, BIOPSY:  ?-  Invasive squamous cell carcinoma, keratinizing  ? ?Past/Anticipated interventions by Gyn/Onc surgery, if any: surgery is not recommended ? ?Past/Anticipated interventions by medical oncology, if any:  ?patient voiced not being interested in radiation or chemotherapy. palliative radiation to decrease the risk of having significant bleeding. ? ?Weight changes, if any: no ? ?Bowel/Bladder complaints, if any: denies diarrhea. Reports constipation, frequency, urgency, nocturia x5 ? ?Nausea/Vomiting, if any: no ? ?Pain issues, if any:  no ? ?SAFETY ISSUES: ?Prior radiation? no ?Pacemaker/ICD? no ?Possible current pregnancy? no, hysterectomy ?Is the patient on methotrexate? no ? ?Current Complaints / other details:  postmenopausal bleeding continues ? ?Vitals:  ? 10/11/21 0834  ?BP: (!) 151/64  ?Pulse: 67  ?Resp: 20  ?Temp: 98.1 ?F (36.7 ?C)  ?SpO2: 100%  ?Weight: 220 lb (99.8 kg)  ?Height: '5\' 5"'$  (1.651 m)  ? ? ? ?

## 2021-10-05 NOTE — Telephone Encounter (Signed)
Called Megan Ryan back and reviewed all upcoming appointments and advised that Dr. Calton Dach appointment has been rescheduled to 10/14/21 at 2:00.  She verbalized understanding and agreement. ?

## 2021-10-08 ENCOUNTER — Encounter: Payer: Self-pay | Admitting: Hematology and Oncology

## 2021-10-08 ENCOUNTER — Ambulatory Visit (HOSPITAL_COMMUNITY)
Admission: RE | Admit: 2021-10-08 | Discharge: 2021-10-08 | Disposition: A | Discharge status: Home / Self Care | Payer: Medicare HMO | Source: Ambulatory Visit | Attending: Gynecologic Oncology | Admitting: Gynecologic Oncology

## 2021-10-08 DIAGNOSIS — C539 Malignant neoplasm of cervix uteri, unspecified: Secondary | ICD-10-CM | POA: Diagnosis present

## 2021-10-08 DIAGNOSIS — I2510 Atherosclerotic heart disease of native coronary artery without angina pectoris: Secondary | ICD-10-CM | POA: Insufficient documentation

## 2021-10-08 DIAGNOSIS — N888 Other specified noninflammatory disorders of cervix uteri: Secondary | ICD-10-CM

## 2021-10-08 LAB — GLUCOSE, CAPILLARY: Glucose-Capillary: 97 mg/dL (ref 70–99)

## 2021-10-08 MED ORDER — FLUDEOXYGLUCOSE F - 18 (FDG) INJECTION
11.0000 | Freq: Once | INTRAVENOUS | Status: AC
  Administered 2021-10-08: 11 via INTRAVENOUS

## 2021-10-10 NOTE — Progress Notes (Signed)
?Radiation Oncology         [phone]) [phone] ?________________________________ ? ?Initial Outpatient Consultation ? ?Name: Megan Ryan MRN: [id number]  ?Date: 10/11/2021  DOB: [birth year] ? ?[id number], Christean Grief, MD  Lafonda Mosses, MD  ? ?REFERRING PHYSICIAN: Lafonda Mosses, MD ? ?DIAGNOSIS: The encounter diagnosis was Vaginal cancer ([place]). ? 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 ?HISTORY OF PRESENT ILLNESS::Megan Ryan is a 86 y.o. female who is accompanied by her daughter. she is seen as a courtesy of Dr. Berline Lopes for an opinion concerning radiation therapy as part of management for her recently diagnosed vaginal cancer.  ? ?The patient presented to the [facility] ED on 09/17/21 with reports of  postmenopausal bleeding x 2-3 weeks, worse at night. The patient reported that she was unsure whether her bleeding was rectal or vaginal, and reported a hysterectomy and BSO performed in the 1960's (though does not remember any indications for her procedure). Speculum exam performed in the ED showed an irregular mass within the vaginal canal appearing originate from the cervix. Friable tissue was also noted with small amounts of bleeding, but no discharge. Rectal exam also revealed a palpable soft tissue mass within the anterior rectal wall. CT of the abdomen and pelvis performed in the ED further revealed a new 6 cm ill-defined soft tissue mass centered in the region of the cervix and upper vagina, with possible involvement of the rectum and/or bladder suspicious for cervical carcinoma. CT also showed a stable small benign left adrenal adenoma. No evidence of metastatic disease was appreciated. ? ?Accordingly, the patient was referred to Dr. Berline Lopes on 09/27/21 for further evaluation. During this visit, the patient denied any associated clots, pain, cramping, vaginal discharge or any urinary symptoms (other than baseline urinary incontinence).  She also reported seeing her primary care provider  shortly after the bleeding started and being treated with antibiotics for a presumed urinary tract infection although did not have any change in her symptoms. Pelvic exam performed during this visit again revealed the friable mass, noted as frondular in appearance, originating from the left vaginal sidewall and extending up to the apex measuring 6-8 cm.  On bimanual exam, the 6-8 cm mass was again noted, extending from the anterior to the posterior vagina along the left sidewall.  The mass was noted to presumably extend up to the rectum, but invasion through the rectal mucosa could not be confirmed by palpation.  ?  ?Dr. Berline Lopes collected biopsies of the vaginal side wall mass during that visit (09/27/21) which revealed invasive keratinizing squamous cell carcinoma. ? ?Given her age and stories she has heard regarding cancer treatment, the patient voiced disinterest in pursuing any treatment  Dr. Berline Lopes discussed the natural progression of disease with her and that given the tumors size and location, it is very possible that she could develop significant bleeding in the near future and/or invasion into the rectum.  Dr. Berline Lopes encouraged the patient to meet with both radiation and medical oncology to hear more about treatment options before making her decision.  Regardless if she forgoes any form of treatment, the patient was informed that palliative radiation could decrease her risk of significant bleeding. ?  ?Pertinent imaging thus far includes:  ?-- PET on 10/08/21 which demonstrated a marked hypermetabolism associated with the cervical mass, consistent with cervical cancer, and a small hypermetabolic right pelvic sidewall node suspicious for metastatic adenopathy. Otherwise, PET showed no evidence of omental or peritoneal surface lesions, or findings suspicious  for metastatic disease involving the chest or bony ?structures. (Other findings of potential clinical significance included a diffuse hypermetabolism in  the thyroid gland, likely reflecting ?Thyroiditis, and age related atherosclerotic disease).  ? ?Of note: the patient's case was discussed at the tumor board held on 10/04/21. ? ?PREVIOUS RADIATION THERAPY: No ? ?PAST MEDICAL HISTORY:  ?Past Medical History:  ?Diagnosis Date  ? Arthritis   ? knee and back pain  ? Cervical cancer ([place]) 10/02/2021  ? Diabetes mellitus ([place]) 06/27/2016  ? Hypertension   ? ? ?PAST SURGICAL HISTORY: ?Past Surgical History:  ?Procedure Laterality Date  ? HERNIA REPAIR  2003  ? repair with mesh  ? HYSTERECTOMY ABDOMINAL WITH SALPINGO-OOPHORECTOMY  1960  ? doesn't remember why, doesn't remember being on HRT (never had menopausal symptoms)  ? ? ?FAMILY HISTORY:  ?Family History  ?Problem Relation Age of Onset  ? Diabetes Mother   ? Diabetes Father   ? Breast cancer Sister   ? Diabetes Sister   ? Cancer Sister   ?     thinks may have had colon or stomach cancer  ? Prostate cancer Brother   ? Diabetes Brother   ? Cancer Brother   ?     Liver cancer  ? CAD Neg Hx   ? Colon cancer Neg Hx   ? Ovarian cancer Neg Hx   ? Endometrial cancer Neg Hx   ? Pancreatic cancer Neg Hx   ? ? ?SOCIAL HISTORY:  ?Social History  ? ?Tobacco Use  ? Smoking status: Never  ? Smokeless tobacco: Never  ?Vaping Use  ? Vaping Use: Never used  ?Substance Use Topics  ? Alcohol use: No  ? Drug use: No  ? ? ?ALLERGIES: No Known Allergies ? ?MEDICATIONS:  ?Current Outpatient Medications  ?Medication Sig Dispense Refill  ? acetaminophen (TYLENOL) 325 MG tablet Take 650 mg by mouth as needed.    ? amLODipine (NORVASC) 5 MG tablet Take 10 mg by mouth daily.    ? aspirin EC 81 MG tablet Take 81 mg by mouth daily.    ? hydrochlorothiazide (HYDRODIURIL) 25 MG tablet TAKE 1 TABLET BY MOUTH IN THE MORNING (Patient taking differently: Take 25 mg by mouth daily.) 90 tablet 0  ? polyethylene glycol (MIRALAX / GLYCOLAX) 17 g packet Take 17 g by mouth daily.    ? NEOMYCIN-POLYMYXIN-HYDROCORTISONE (CORTISPORIN) 1 % SOLN OTIC solution Apply  1-2 drops to toe BID after soaking (Patient not taking: Reported on 10/11/2021) 10 mL 1  ? ?No current facility-administered medications for this encounter.  ? ? ?REVIEW OF SYSTEMS:  A 10+ POINT REVIEW OF SYSTEMS WAS OBTAINED including neurology, dermatology, psychiatry, cardiac, respiratory, lymph, extremities, GI, GU, musculoskeletal, constitutional, reproductive, HEENT.  She reports ongoing vaginal bleeding.  She changes her pad approximately 3 times per day.  No significant bleeding recently.  She denies any significant pain in the pelvis area.  She does report some urinary incontinence. ?  ?PHYSICAL EXAM:  height is '5\' 5"'$  (1.651 m) and weight is 220 lb (99.8 kg). Her temperature is 98.1 ?F (36.7 ?C). Her blood pressure is 151/64 (abnormal) and her pulse is 67. Her respiration is 20 and oxygen saturation is 100%.   ?General: Alert and oriented, in no acute distress ?HEENT: Head is normocephalic. Extraocular movements are intact.  ?Neck: Neck is supple, no palpable cervical or supraclavicular lymphadenopathy. ?Heart: Regular in rate and rhythm with no murmurs, rubs, or gallops. ?Chest: Clear to auscultation bilaterally, with no rhonchi, wheezes,  or rales. ?Abdomen: Soft, nontender, nondistended, with no rigidity or guarding. ?Extremities: No cyanosis or edema. ?Lymphatics: see Neck Exam ?Skin: No concerning lesions. ?Musculoskeletal: symmetric strength and muscle tone throughout. ?Neurologic: Cranial nerves II through XII are grossly intact. No obvious focalities. Speech is fluent. Coordination is intact. ?Psychiatric: Judgment and insight are intact. Affect is appropriate. ? ?On pelvic examination the external genitalia were unremarkable. A speculum exam was performed.  There is a palpable mass which appears to originate from the left vaginal sidewall.  This appears to extend up to the upper extent of the vaginal vault.  On bimanual examination the mass extends from approximately the 12 o'clock position to the 6  o'clock position posteriorly.  On rectovaginal examination the mass is easily palpable with a thin layer of mucosa palpable  between the mass in the rectal lumen..  No obvious penetration through the re

## 2021-10-11 ENCOUNTER — Ambulatory Visit
Admission: RE | Admit: 2021-10-11 | Discharge: 2021-10-11 | Disposition: A | Discharge status: Home / Self Care | Payer: Medicare HMO | Source: Ambulatory Visit | Attending: Radiation Oncology | Admitting: Radiation Oncology

## 2021-10-11 ENCOUNTER — Encounter: Payer: Self-pay | Admitting: Radiation Oncology

## 2021-10-11 ENCOUNTER — Telehealth: Payer: Self-pay | Admitting: Gynecologic Oncology

## 2021-10-11 ENCOUNTER — Other Ambulatory Visit: Payer: Self-pay

## 2021-10-11 ENCOUNTER — Ambulatory Visit: Payer: Medicare HMO | Admitting: Hematology and Oncology

## 2021-10-11 VITALS — BP 151/64 | HR 67 | Temp 98.1°F | Resp 20 | Ht 65.0 in | Wt 220.0 lb

## 2021-10-11 DIAGNOSIS — C52 Malignant neoplasm of vagina: Secondary | ICD-10-CM | POA: Diagnosis present

## 2021-10-11 DIAGNOSIS — E119 Type 2 diabetes mellitus without complications: Secondary | ICD-10-CM | POA: Diagnosis not present

## 2021-10-11 DIAGNOSIS — Z809 Family history of malignant neoplasm, unspecified: Secondary | ICD-10-CM | POA: Diagnosis not present

## 2021-10-11 DIAGNOSIS — C775 Secondary and unspecified malignant neoplasm of intrapelvic lymph nodes: Secondary | ICD-10-CM | POA: Insufficient documentation

## 2021-10-11 DIAGNOSIS — I119 Hypertensive heart disease without heart failure: Secondary | ICD-10-CM | POA: Insufficient documentation

## 2021-10-11 DIAGNOSIS — D3502 Benign neoplasm of left adrenal gland: Secondary | ICD-10-CM | POA: Insufficient documentation

## 2021-10-11 DIAGNOSIS — Z7982 Long term (current) use of aspirin: Secondary | ICD-10-CM | POA: Diagnosis not present

## 2021-10-11 DIAGNOSIS — E279 Disorder of adrenal gland, unspecified: Secondary | ICD-10-CM | POA: Diagnosis not present

## 2021-10-11 DIAGNOSIS — Z79899 Other long term (current) drug therapy: Secondary | ICD-10-CM | POA: Diagnosis not present

## 2021-10-11 DIAGNOSIS — Z8541 Personal history of malignant neoplasm of cervix uteri: Secondary | ICD-10-CM | POA: Diagnosis not present

## 2021-10-11 NOTE — Progress Notes (Signed)
See MD note for nursing evaluation. °

## 2021-10-11 NOTE — Telephone Encounter (Signed)
Called patient.  Discussed PET scan results, which had been reviewed with her at her radiation oncology appointment today.  All questions answered. ? ?Jeral Pinch MD ?Gynecologic Oncology ? ?

## 2021-10-14 ENCOUNTER — Inpatient Hospital Stay: Payer: Medicare HMO | Admitting: Hematology and Oncology

## 2021-10-14 ENCOUNTER — Other Ambulatory Visit: Payer: Self-pay

## 2021-10-14 ENCOUNTER — Encounter: Payer: Self-pay | Admitting: Hematology and Oncology

## 2021-10-14 DIAGNOSIS — C52 Malignant neoplasm of vagina: Secondary | ICD-10-CM

## 2021-10-14 DIAGNOSIS — I10 Essential (primary) hypertension: Secondary | ICD-10-CM

## 2021-10-14 NOTE — Assessment & Plan Note (Signed)
Her blood pressure is elevated today could be due to anxiety ?She will continue follow-up with primary care doctor for medication adjustment ?

## 2021-10-14 NOTE — Progress Notes (Signed)
[place] ?CONSULT NOTE ? ?Patient Care Team: ?[name], MD as PCP - General (Internal Medicine) ? ?ASSESSMENT & PLAN:  ?Vaginal cancer ([place]) ?I have reviewed PET CT imaging with the patient and her sister ?The patient has clinically advanced disease with likely lymph node involvement in the pelvic sidewall ?We discussed the role of concurrent chemoradiation therapy versus sequential radiation followed by chemotherapy ?The patient is not interested to pursue chemotherapy ?I will reach out to GYN navigator to inform radiation oncologist that she is only interested to pursue radiation treatment ?She will follow-up with radiation oncologist and GYN surgeon in the future ? ?Essential hypertension ?Her blood pressure is elevated today could be due to anxiety ?She will continue follow-up with primary care doctor for medication adjustment ? ?No orders of the defined types were placed in this encounter. ? ? ?The total time spent in the appointment was 60 minutes encounter with patients including review of chart and various tests results, discussions about plan of care and coordination of care plan ? ? All questions were answered. The patient knows to call the clinic with any problems, questions or concerns. No barriers to learning was detected. ? ?Heath Lark, MD ?4/20/20232:38 PM ? ?CHIEF COMPLAINTS/PURPOSE OF CONSULTATION:  ?Vaginal cancer, for further evaluation ? ?HISTORY OF PRESENTING ILLNESS:  ?Megan Ryan 86 y.o. female is here because of recent diagnosis of vaginal cancer ?She is here accompanied by his sister ?The patient is retired ?She has 2 children who lives far away ?She has been active with activities of daily living  ?Her symptoms started with postmenopausal bleeding ?She also have sensation of frequent urination ?She underwent further evaluation and subsequently had biopsy proven vaginal cancer ?She has seen radiation oncologist recently ?She denies abdominal pain, weight loss or  nausea ?She has mild chronic constipation ? ?I have reviewed her chart and materials related to her cancer extensively and collaborated history with the patient. Summary of oncologic history is as follows: ?Oncology History  ?Vaginal cancer ([place])  ?09/16/2021 Initial Diagnosis  ? The patient initially presented on 3/24 with several weeks of postmenopausal bleeding.  ?  ?09/17/2021 Imaging  ? Ct abdomen and pelvis ?New 6 cm ill-defined soft tissue mass centered in the region of the cervix and upper vagina, with possible involvement of the rectum and/or bladder. This is suspicious for cervical carcinoma. Recommend GYN consultation, and consider pelvic MRI without and with contrast for further evaluation. ?  ?Several tiny calcified uterine fibroids. ?  ?No evidence of metastatic disease. ?  ?Stable small benign left adrenal adenoma. ?  ?09/27/2021 Pathology Results  ? A. VAGINAL SIDEWALL, BIOPSY:  ?-  Invasive squamous cell carcinoma, keratinizing  ?-  See comment ?  ?10/02/2021 Initial Diagnosis  ? Cervical cancer [facility]) ?  ?10/08/2021 PET scan  ? 1. Marked hypermetabolism associated with the cervical mass consistent with cervical cancer. ?2. Small hypermetabolic right pelvic sidewall node is suspicious for metastatic adenopathy. ?3. No omental or peritoneal surface lesions are identified.  ?4. No findings for metastatic disease involving the chest or bony structures. ?5. Diffuse hypermetabolism in the thyroid gland likely reflecting thyroiditis. ?6. Age related atherosclerotic disease. ?  ?  ?  ? ? ?MEDICAL HISTORY:  ?Past Medical History:  ?Diagnosis Date  ? Arthritis   ? knee and back pain  ? Cervical cancer ([place]) 10/02/2021  ? Diabetes mellitus ([place]) 06/27/2016  ? Hypertension   ? ? ?SURGICAL HISTORY: ?Past Surgical History:  ?Procedure Laterality Date  ?  HERNIA REPAIR  2003  ? repair with mesh  ? HYSTERECTOMY ABDOMINAL WITH SALPINGO-OOPHORECTOMY  1960  ? doesn't remember why, doesn't remember being on HRT (never had  menopausal symptoms)  ? ? ?SOCIAL HISTORY: ?Social History  ? ?Socioeconomic History  ? Marital status: Divorced  ?  Spouse name: Not on file  ? Number of children: 2  ? Years of education: Not on file  ? Highest education level: Not on file  ?Occupational History  ? Not on file  ?Tobacco Use  ? Smoking status: Never  ? Smokeless tobacco: Never  ?Vaping Use  ? Vaping Use: Never used  ?Substance and Sexual Activity  ? Alcohol use: No  ? Drug use: No  ? Sexual activity: Not Currently  ?Other Topics Concern  ? Not on file  ?Social History Narrative  ? Not on file  ? ?Social Determinants of Health  ? ?Financial Resource Strain: Not on file  ?Food Insecurity: Not on file  ?Transportation Needs: Not on file  ?Physical Activity: Not on file  ?Stress: Not on file  ?Social Connections: Not on file  ?Intimate Partner Violence: Not on file  ? ? ?FAMILY HISTORY: ?Family History  ?Problem Relation Age of Onset  ? Diabetes Mother   ? Diabetes Father   ? Breast cancer Sister   ? Diabetes Sister   ? Cancer Sister   ?     thinks may have had colon or stomach cancer  ? Prostate cancer Brother   ? Diabetes Brother   ? Cancer Brother   ?     Liver cancer  ? CAD Neg Hx   ? Colon cancer Neg Hx   ? Ovarian cancer Neg Hx   ? Endometrial cancer Neg Hx   ? Pancreatic cancer Neg Hx   ? ? ?ALLERGIES:  has No Known Allergies. ? ?MEDICATIONS:  ?Current Outpatient Medications  ?Medication Sig Dispense Refill  ? acetaminophen (TYLENOL) 325 MG tablet Take 650 mg by mouth as needed.    ? amLODipine (NORVASC) 5 MG tablet Take 10 mg by mouth daily.    ? aspirin EC 81 MG tablet Take 81 mg by mouth daily.    ? hydrochlorothiazide (HYDRODIURIL) 25 MG tablet TAKE 1 TABLET BY MOUTH IN THE MORNING (Patient taking differently: Take 25 mg by mouth daily.) 90 tablet 0  ? NEOMYCIN-POLYMYXIN-HYDROCORTISONE (CORTISPORIN) 1 % SOLN OTIC solution Apply 1-2 drops to toe BID after soaking (Patient not taking: Reported on 10/11/2021) 10 mL 1  ? polyethylene glycol  (MIRALAX / GLYCOLAX) 17 g packet Take 17 g by mouth daily.    ? ?No current facility-administered medications for this visit.  ? ? ?REVIEW OF SYSTEMS:   ?Constitutional: Denies fevers, chills or abnormal night sweats ?Eyes: Denies blurriness of vision, double vision or watery eyes ?Ears, nose, mouth, throat, and face: Denies mucositis or sore throat ?Respiratory: Denies cough, dyspnea or wheezes ?Cardiovascular: Denies palpitation, chest discomfort or lower extremity swelling ?Skin: Denies abnormal skin rashes ?Lymphatics: Denies new lymphadenopathy or easy bruising ?Neurological:Denies numbness, tingling or new weaknesses ?Behavioral/Psych: Mood is stable, no new changes  ?All other systems were reviewed with the patient and are negative. ? ?PHYSICAL EXAMINATION: ?ECOG PERFORMANCE STATUS: 1 - Symptomatic but completely ambulatory ? ?Vitals:  ? 10/14/21 1354  ?BP: (!) 161/61  ?Pulse: 80  ?Temp: 98.4 ?F (36.9 ?C)  ?SpO2: 99%  ? ?Filed Weights  ? 10/14/21 1354  ?Weight: 219 lb (99.3 kg)  ? ? ?GENERAL:alert, no distress and comfortable ?  SKIN: skin color, texture, turgor are normal, no rashes or significant lesions ?EYES: normal, conjunctiva are pink and non-injected, sclera clear ?OROPHARYNX:no exudate, no erythema and lips, buccal mucosa, and tongue normal  ?NECK: supple, thyroid normal size, non-tender, without nodularity ?LYMPH:  no palpable lymphadenopathy in the cervical, axillary or inguinal ?LUNGS: clear to auscultation and percussion with normal breathing effort ?HEART: regular rate & rhythm and no murmurs and no lower extremity edema ?ABDOMEN:abdomen soft, non-tender and normal bowel sounds ?Musculoskeletal:no cyanosis of digits and no clubbing  ?PSYCH: alert & oriented x 3 with fluent speech ?NEURO: no focal motor/sensory deficits ? ?LABORATORY DATA:  ?I have reviewed the data as listed ?Lab Results  ?Component Value Date  ? WBC 4.8 09/17/2021  ? HGB 11.5 (L) 09/17/2021  ? HCT 36.9 09/17/2021  ? MCV 97.1  09/17/2021  ? PLT 208 09/17/2021  ? ?Recent Labs  ?  07/09/21 ?0000 09/17/21 ?1120  ?NA 139 139  ?K 4.3 3.6  ?CL 102 106  ?CO2 28 28  ?GLUCOSE 78 83  ?BUN 20 16  ?CREATININE 1.06* 0.77  ?CALCIUM 9.5 9.8  ?GFRNONAA  --

## 2021-10-14 NOTE — Assessment & Plan Note (Signed)
I have reviewed PET CT imaging with the patient and her sister ?The patient has clinically advanced disease with likely lymph node involvement in the pelvic sidewall ?We discussed the role of concurrent chemoradiation therapy versus sequential radiation followed by chemotherapy ?The patient is not interested to pursue chemotherapy ?I will reach out to GYN navigator to inform radiation oncologist that she is only interested to pursue radiation treatment ?She will follow-up with radiation oncologist and GYN surgeon in the future ?

## 2021-10-22 ENCOUNTER — Ambulatory Visit (INDEPENDENT_AMBULATORY_CARE_PROVIDER_SITE_OTHER): Payer: Medicare HMO | Admitting: Podiatry

## 2021-10-22 ENCOUNTER — Encounter: Payer: Self-pay | Admitting: Podiatry

## 2021-10-22 DIAGNOSIS — B351 Tinea unguium: Secondary | ICD-10-CM | POA: Diagnosis not present

## 2021-10-22 DIAGNOSIS — M79676 Pain in unspecified toe(s): Secondary | ICD-10-CM | POA: Diagnosis not present

## 2021-10-31 NOTE — Progress Notes (Signed)
?  Subjective:  ?Patient ID: Megan Ryan, female    DOB: September 20, 1935,  MRN: [id number] ? ?Hal Morales presents to clinic today for preventative diabetic foot care and painful elongated mycotic toenails 1-5 bilaterally which are tender when wearing enclosed shoe gear. Pain is relieved with periodic professional debridement. ? ?Last known HgA1c was 8.7%. Patient did not check blood glucose today. ? ?New problem(s): None.  ? ?PCP is [name], MD. ? ?No Known Allergies ? ?Review of Systems: Negative except as noted in the HPI. ? ?Objective: No changes noted in today's physical examination. ? ?Objective:  ? ?Vascular Examination: ?Vascular status intact b/l with palpable pedal pulses. Pedal hair present b/l. CFT <3 seconds b/l. No edema. No pain with calf compression b/l. Skin temperature gradient WNL b/l.  ? ?Neurological Examination: ?Sensation grossly intact b/l with 10 gram monofilament. Vibratory sensation intact b/l.  ? ?Dermatological Examination: ?Pedal skin with normal turgor, texture and tone b/l. Toenails 1-5 b/l thick, discolored, elongated with subungual debris and pain on dorsal palpation. No hyperkeratotic lesions noted b/l.  ? ?Musculoskeletal Examination: ?Muscle strength 5/5 to b/l LE. No pain, crepitus or joint limitation noted with ROM bilateral LE. ? ?Radiographs: None ? ?Assessment/Plan: ?1. Pain due to onychomycosis of toenail   ?  ?-Patient was evaluated and treated. All patient's and/or POA's questions/concerns answered on today's visit. ?-Continue foot and shoe inspections daily. Monitor blood glucose per PCP/Endocrinologist's recommendations. ?-Patient to continue soft, supportive shoe gear daily. ?-Mycotic toenails 1-5 bilaterally were debrided in length and girth with sterile nail nippers and dremel without incident. ?-Patient/POA to call should there be question/concern in the interim.  ? ?Return in about 3 months (around 01/21/2022). ? ?Marzetta Board, DPM  ?

## 2021-11-04 ENCOUNTER — Telehealth: Payer: Self-pay

## 2021-11-04 NOTE — Telephone Encounter (Signed)
Called patient to reschedule CT sim, patients new appointment will be on 5/15 @ 1030 am to see the nurse and 11 am for CT sim. Patient voiced understanding.  ?

## 2021-11-05 ENCOUNTER — Ambulatory Visit: Payer: Medicare HMO | Admitting: Radiation Oncology

## 2021-11-08 ENCOUNTER — Ambulatory Visit
Admission: RE | Admit: 2021-11-08 | Discharge: 2021-11-08 | Disposition: A | Discharge status: Home / Self Care | Payer: Medicare HMO | Source: Ambulatory Visit | Attending: Radiation Oncology | Admitting: Radiation Oncology

## 2021-11-08 ENCOUNTER — Other Ambulatory Visit: Payer: Self-pay

## 2021-11-08 VITALS — BP 138/74 | HR 81 | Temp 98.4°F | Resp 16 | Wt 217.4 lb

## 2021-11-08 DIAGNOSIS — Z510 Encounter for antineoplastic radiation therapy: Secondary | ICD-10-CM | POA: Insufficient documentation

## 2021-11-08 DIAGNOSIS — C52 Malignant neoplasm of vagina: Secondary | ICD-10-CM | POA: Insufficient documentation

## 2021-11-15 DIAGNOSIS — Z510 Encounter for antineoplastic radiation therapy: Secondary | ICD-10-CM | POA: Diagnosis not present

## 2021-11-17 ENCOUNTER — Other Ambulatory Visit: Payer: Self-pay

## 2021-11-17 ENCOUNTER — Ambulatory Visit
Admission: RE | Admit: 2021-11-17 | Discharge: 2021-11-17 | Disposition: A | Discharge status: Home / Self Care | Payer: Medicare HMO | Source: Ambulatory Visit | Attending: Radiation Oncology | Admitting: Radiation Oncology

## 2021-11-17 DIAGNOSIS — Z510 Encounter for antineoplastic radiation therapy: Secondary | ICD-10-CM | POA: Diagnosis not present

## 2021-11-17 LAB — RAD ONC ARIA SESSION SUMMARY
Course Elapsed Days: 0
Plan Fractions Treated to Date: 1
Plan Prescribed Dose Per Fraction: 1.8 Gy
Plan Total Fractions Prescribed: 25
Plan Total Prescribed Dose: 45 Gy
Reference Point Dosage Given to Date: 1.8 Gy
Reference Point Session Dosage Given: 1.8 Gy
Session Number: 1

## 2021-11-18 ENCOUNTER — Other Ambulatory Visit: Payer: Self-pay

## 2021-11-18 ENCOUNTER — Ambulatory Visit
Admission: RE | Admit: 2021-11-18 | Discharge: 2021-11-18 | Disposition: A | Discharge status: Home / Self Care | Payer: Medicare HMO | Source: Ambulatory Visit | Attending: Radiation Oncology | Admitting: Radiation Oncology

## 2021-11-18 DIAGNOSIS — Z510 Encounter for antineoplastic radiation therapy: Secondary | ICD-10-CM | POA: Diagnosis not present

## 2021-11-18 LAB — RAD ONC ARIA SESSION SUMMARY
Course Elapsed Days: 1
Plan Fractions Treated to Date: 2
Plan Prescribed Dose Per Fraction: 1.8 Gy
Plan Total Fractions Prescribed: 25
Plan Total Prescribed Dose: 45 Gy
Reference Point Dosage Given to Date: 3.6 Gy
Reference Point Session Dosage Given: 1.8 Gy
Session Number: 2

## 2021-11-19 ENCOUNTER — Other Ambulatory Visit: Payer: Self-pay

## 2021-11-19 ENCOUNTER — Ambulatory Visit
Admission: RE | Admit: 2021-11-19 | Discharge: 2021-11-19 | Disposition: A | Discharge status: Home / Self Care | Payer: Medicare HMO | Source: Ambulatory Visit | Attending: Radiation Oncology | Admitting: Radiation Oncology

## 2021-11-19 DIAGNOSIS — Z510 Encounter for antineoplastic radiation therapy: Secondary | ICD-10-CM | POA: Diagnosis not present

## 2021-11-19 LAB — RAD ONC ARIA SESSION SUMMARY
Course Elapsed Days: 2
Plan Fractions Treated to Date: 3
Plan Prescribed Dose Per Fraction: 1.8 Gy
Plan Total Fractions Prescribed: 25
Plan Total Prescribed Dose: 45 Gy
Reference Point Dosage Given to Date: 5.4 Gy
Reference Point Session Dosage Given: 1.8 Gy
Session Number: 3

## 2021-11-23 ENCOUNTER — Other Ambulatory Visit: Payer: Self-pay

## 2021-11-23 ENCOUNTER — Ambulatory Visit
Admission: RE | Admit: 2021-11-23 | Discharge: 2021-11-23 | Disposition: A | Discharge status: Home / Self Care | Payer: Medicare HMO | Source: Ambulatory Visit | Attending: Radiation Oncology | Admitting: Radiation Oncology

## 2021-11-23 DIAGNOSIS — Z510 Encounter for antineoplastic radiation therapy: Secondary | ICD-10-CM | POA: Diagnosis not present

## 2021-11-23 LAB — RAD ONC ARIA SESSION SUMMARY
Course Elapsed Days: 6
Plan Fractions Treated to Date: 4
Plan Prescribed Dose Per Fraction: 1.8 Gy
Plan Total Fractions Prescribed: 25
Plan Total Prescribed Dose: 45 Gy
Reference Point Dosage Given to Date: 7.2 Gy
Reference Point Session Dosage Given: 1.8 Gy
Session Number: 4

## 2021-11-24 ENCOUNTER — Other Ambulatory Visit: Payer: Self-pay

## 2021-11-24 ENCOUNTER — Ambulatory Visit
Admission: RE | Admit: 2021-11-24 | Discharge: 2021-11-24 | Disposition: A | Discharge status: Home / Self Care | Payer: Medicare HMO | Source: Ambulatory Visit | Attending: Radiation Oncology | Admitting: Radiation Oncology

## 2021-11-24 DIAGNOSIS — Z510 Encounter for antineoplastic radiation therapy: Secondary | ICD-10-CM | POA: Diagnosis not present

## 2021-11-24 LAB — RAD ONC ARIA SESSION SUMMARY
Course Elapsed Days: 7
Plan Fractions Treated to Date: 5
Plan Prescribed Dose Per Fraction: 1.8 Gy
Plan Total Fractions Prescribed: 25
Plan Total Prescribed Dose: 45 Gy
Reference Point Dosage Given to Date: 9 Gy
Reference Point Session Dosage Given: 1.8 Gy
Session Number: 5

## 2021-11-24 NOTE — Progress Notes (Signed)
Pt here for patient teaching.    Pt given Radiation and You booklet and skin care instructions.    Reviewed areas of pertinence such as diarrhea, fatigue, hair loss in treatment field, nausea and vomiting, sexual and fertility changes, skin changes, and urinary and bladder changes .   Pt able to give teach back of to pat skin, use unscented/gentle soap, use baby wipes, have Imodium on hand, and drink plenty of water,avoid applying anything to skin within 4 hours of treatment.   Pt verbalizes understanding of information given and will contact nursing with any questions or concerns.

## 2021-11-25 ENCOUNTER — Ambulatory Visit
Admission: RE | Admit: 2021-11-25 | Discharge: 2021-11-25 | Disposition: A | Discharge status: Home / Self Care | Payer: Medicare HMO | Source: Ambulatory Visit | Attending: Radiation Oncology | Admitting: Radiation Oncology

## 2021-11-25 ENCOUNTER — Other Ambulatory Visit: Payer: Self-pay

## 2021-11-25 DIAGNOSIS — C52 Malignant neoplasm of vagina: Secondary | ICD-10-CM | POA: Diagnosis not present

## 2021-11-25 LAB — RAD ONC ARIA SESSION SUMMARY
Course Elapsed Days: 8
Plan Fractions Treated to Date: 6
Plan Prescribed Dose Per Fraction: 1.8 Gy
Plan Total Fractions Prescribed: 25
Plan Total Prescribed Dose: 45 Gy
Reference Point Dosage Given to Date: 10.8 Gy
Reference Point Session Dosage Given: 1.8 Gy
Session Number: 6

## 2021-11-26 ENCOUNTER — Ambulatory Visit
Admission: RE | Admit: 2021-11-26 | Discharge: 2021-11-26 | Disposition: A | Discharge status: Home / Self Care | Payer: Medicare HMO | Source: Ambulatory Visit | Attending: Radiation Oncology | Admitting: Radiation Oncology

## 2021-11-26 ENCOUNTER — Other Ambulatory Visit: Payer: Self-pay

## 2021-11-26 DIAGNOSIS — C52 Malignant neoplasm of vagina: Secondary | ICD-10-CM | POA: Diagnosis not present

## 2021-11-26 LAB — RAD ONC ARIA SESSION SUMMARY
Course Elapsed Days: 9
Plan Fractions Treated to Date: 7
Plan Prescribed Dose Per Fraction: 1.8 Gy
Plan Total Fractions Prescribed: 25
Plan Total Prescribed Dose: 45 Gy
Reference Point Dosage Given to Date: 12.6 Gy
Reference Point Session Dosage Given: 1.8 Gy
Session Number: 7

## 2021-11-29 ENCOUNTER — Other Ambulatory Visit: Payer: Self-pay

## 2021-11-29 ENCOUNTER — Ambulatory Visit
Admission: RE | Admit: 2021-11-29 | Discharge: 2021-11-29 | Disposition: A | Discharge status: Home / Self Care | Payer: Medicare HMO | Source: Ambulatory Visit | Attending: Radiation Oncology | Admitting: Radiation Oncology

## 2021-11-29 DIAGNOSIS — C52 Malignant neoplasm of vagina: Secondary | ICD-10-CM | POA: Diagnosis not present

## 2021-11-29 LAB — RAD ONC ARIA SESSION SUMMARY
Course Elapsed Days: 12
Plan Fractions Treated to Date: 8
Plan Prescribed Dose Per Fraction: 1.8 Gy
Plan Total Fractions Prescribed: 25
Plan Total Prescribed Dose: 45 Gy
Reference Point Dosage Given to Date: 14.4 Gy
Reference Point Session Dosage Given: 1.8 Gy
Session Number: 8

## 2021-11-30 ENCOUNTER — Ambulatory Visit
Admission: RE | Admit: 2021-11-30 | Discharge: 2021-11-30 | Disposition: A | Discharge status: Home / Self Care | Payer: Medicare HMO | Source: Ambulatory Visit | Attending: Radiation Oncology | Admitting: Radiation Oncology

## 2021-11-30 ENCOUNTER — Ambulatory Visit: Payer: Medicare HMO

## 2021-11-30 ENCOUNTER — Other Ambulatory Visit: Payer: Self-pay

## 2021-11-30 ENCOUNTER — Other Ambulatory Visit: Payer: Self-pay | Admitting: Radiation Oncology

## 2021-11-30 DIAGNOSIS — C52 Malignant neoplasm of vagina: Secondary | ICD-10-CM | POA: Diagnosis not present

## 2021-11-30 LAB — RAD ONC ARIA SESSION SUMMARY
Course Elapsed Days: 13
Plan Fractions Treated to Date: 9
Plan Prescribed Dose Per Fraction: 1.8 Gy
Plan Total Fractions Prescribed: 25
Plan Total Prescribed Dose: 45 Gy
Reference Point Dosage Given to Date: 16.2 Gy
Reference Point Session Dosage Given: 1.8 Gy
Session Number: 9

## 2021-11-30 MED ORDER — ONDANSETRON HCL 4 MG PO TABS: 4.0000 mg | ORAL_TABLET | Freq: Three times a day (TID) | ORAL | 0 refills | Status: DC | PRN

## 2021-12-01 ENCOUNTER — Ambulatory Visit
Admission: RE | Admit: 2021-12-01 | Discharge: 2021-12-01 | Disposition: A | Discharge status: Home / Self Care | Payer: Medicare HMO | Source: Ambulatory Visit | Attending: Radiation Oncology | Admitting: Radiation Oncology

## 2021-12-01 ENCOUNTER — Other Ambulatory Visit: Payer: Self-pay

## 2021-12-01 DIAGNOSIS — C52 Malignant neoplasm of vagina: Secondary | ICD-10-CM | POA: Diagnosis not present

## 2021-12-01 LAB — RAD ONC ARIA SESSION SUMMARY
Course Elapsed Days: 14
Plan Fractions Treated to Date: 10
Plan Prescribed Dose Per Fraction: 1.8 Gy
Plan Total Fractions Prescribed: 25
Plan Total Prescribed Dose: 45 Gy
Reference Point Dosage Given to Date: 18 Gy
Reference Point Session Dosage Given: 1.8 Gy
Session Number: 10

## 2021-12-02 ENCOUNTER — Ambulatory Visit
Admission: RE | Admit: 2021-12-02 | Discharge: 2021-12-02 | Disposition: A | Discharge status: Home / Self Care | Payer: Medicare HMO | Source: Ambulatory Visit | Attending: Radiation Oncology | Admitting: Radiation Oncology

## 2021-12-02 ENCOUNTER — Other Ambulatory Visit: Payer: Self-pay

## 2021-12-02 DIAGNOSIS — C52 Malignant neoplasm of vagina: Secondary | ICD-10-CM | POA: Diagnosis not present

## 2021-12-02 LAB — RAD ONC ARIA SESSION SUMMARY
Course Elapsed Days: 15
Plan Fractions Treated to Date: 11
Plan Prescribed Dose Per Fraction: 1.8 Gy
Plan Total Fractions Prescribed: 25
Plan Total Prescribed Dose: 45 Gy
Reference Point Dosage Given to Date: 19.8 Gy
Reference Point Session Dosage Given: 1.8 Gy
Session Number: 11

## 2021-12-03 ENCOUNTER — Other Ambulatory Visit: Payer: Self-pay

## 2021-12-03 ENCOUNTER — Ambulatory Visit
Admission: RE | Admit: 2021-12-03 | Discharge: 2021-12-03 | Disposition: A | Discharge status: Home / Self Care | Payer: Medicare HMO | Source: Ambulatory Visit | Attending: Radiation Oncology | Admitting: Radiation Oncology

## 2021-12-03 DIAGNOSIS — C52 Malignant neoplasm of vagina: Secondary | ICD-10-CM | POA: Diagnosis not present

## 2021-12-03 LAB — RAD ONC ARIA SESSION SUMMARY
Course Elapsed Days: 16
Plan Fractions Treated to Date: 12
Plan Prescribed Dose Per Fraction: 1.8 Gy
Plan Total Fractions Prescribed: 25
Plan Total Prescribed Dose: 45 Gy
Reference Point Dosage Given to Date: 21.6 Gy
Reference Point Session Dosage Given: 1.8 Gy
Session Number: 12

## 2021-12-06 ENCOUNTER — Ambulatory Visit
Admission: RE | Admit: 2021-12-06 | Discharge: 2021-12-06 | Disposition: A | Discharge status: Home / Self Care | Payer: Medicare HMO | Source: Ambulatory Visit | Attending: Radiation Oncology | Admitting: Radiation Oncology

## 2021-12-06 ENCOUNTER — Other Ambulatory Visit: Payer: Self-pay

## 2021-12-06 DIAGNOSIS — C52 Malignant neoplasm of vagina: Secondary | ICD-10-CM | POA: Diagnosis not present

## 2021-12-06 LAB — RAD ONC ARIA SESSION SUMMARY
Course Elapsed Days: 19
Plan Fractions Treated to Date: 13
Plan Prescribed Dose Per Fraction: 1.8 Gy
Plan Total Fractions Prescribed: 25
Plan Total Prescribed Dose: 45 Gy
Reference Point Dosage Given to Date: 23.4 Gy
Reference Point Session Dosage Given: 1.8 Gy
Session Number: 13

## 2021-12-07 ENCOUNTER — Other Ambulatory Visit: Payer: Self-pay

## 2021-12-07 ENCOUNTER — Ambulatory Visit
Admission: RE | Admit: 2021-12-07 | Discharge: 2021-12-07 | Disposition: A | Discharge status: Home / Self Care | Payer: Medicare HMO | Source: Ambulatory Visit | Attending: Radiation Oncology | Admitting: Radiation Oncology

## 2021-12-07 ENCOUNTER — Ambulatory Visit (HOSPITAL_COMMUNITY)
Admission: RE | Admit: 2021-12-07 | Discharge: 2021-12-07 | Disposition: A | Discharge status: Home / Self Care | Payer: Medicare HMO | Source: Ambulatory Visit | Attending: Radiation Oncology | Admitting: Radiation Oncology

## 2021-12-07 ENCOUNTER — Ambulatory Visit: Payer: Medicare HMO

## 2021-12-07 ENCOUNTER — Other Ambulatory Visit: Payer: Self-pay | Admitting: Oncology

## 2021-12-07 ENCOUNTER — Telehealth: Payer: Self-pay | Admitting: Oncology

## 2021-12-07 DIAGNOSIS — C52 Malignant neoplasm of vagina: Secondary | ICD-10-CM | POA: Diagnosis present

## 2021-12-07 LAB — RAD ONC ARIA SESSION SUMMARY
Course Elapsed Days: 20
Plan Fractions Treated to Date: 14
Plan Prescribed Dose Per Fraction: 1.8 Gy
Plan Total Fractions Prescribed: 25
Plan Total Prescribed Dose: 45 Gy
Reference Point Dosage Given to Date: 25.2 Gy
Reference Point Session Dosage Given: 1.8 Gy
Session Number: 14

## 2021-12-07 NOTE — Telephone Encounter (Signed)
Called Megan Ryan and advised her of Symptom Management appointment tomorrow at 11:00 after radiation.  Also discussed that if she has any shortness of breath or chest pain during the night, to go to the ER to be evaluated. She verbalized understanding and agreement.

## 2021-12-07 NOTE — Progress Notes (Signed)
Orders entered for [facility] visit. 

## 2021-12-07 NOTE — Progress Notes (Signed)
Order placed for venous doppler of right leg per Dr. Sondra Come.

## 2021-12-07 NOTE — Progress Notes (Signed)
Right LE venous duplex study completed. Critical results given to Gery Pray MD.  Results can be found under chart review under CV PROC. 12/07/2021 1:04 PM Bobetta Lime BS, RVT

## 2021-12-08 ENCOUNTER — Other Ambulatory Visit: Payer: Self-pay

## 2021-12-08 ENCOUNTER — Inpatient Hospital Stay: Payer: Medicare HMO

## 2021-12-08 ENCOUNTER — Ambulatory Visit
Admission: RE | Admit: 2021-12-08 | Discharge: 2021-12-08 | Disposition: A | Discharge status: Home / Self Care | Payer: Medicare HMO | Source: Ambulatory Visit | Attending: Radiation Oncology | Admitting: Radiation Oncology

## 2021-12-08 ENCOUNTER — Other Ambulatory Visit: Payer: Self-pay | Admitting: Lab

## 2021-12-08 ENCOUNTER — Other Ambulatory Visit (HOSPITAL_COMMUNITY): Payer: Self-pay

## 2021-12-08 ENCOUNTER — Inpatient Hospital Stay: Payer: Medicare HMO | Attending: Gynecologic Oncology | Admitting: Physician Assistant

## 2021-12-08 VITALS — BP 145/63 | HR 80 | Temp 99.9°F | Resp 18 | Wt 212.0 lb

## 2021-12-08 DIAGNOSIS — I82411 Acute embolism and thrombosis of right femoral vein: Secondary | ICD-10-CM | POA: Diagnosis not present

## 2021-12-08 DIAGNOSIS — E876 Hypokalemia: Secondary | ICD-10-CM | POA: Diagnosis not present

## 2021-12-08 DIAGNOSIS — C52 Malignant neoplasm of vagina: Secondary | ICD-10-CM

## 2021-12-08 DIAGNOSIS — Z7982 Long term (current) use of aspirin: Secondary | ICD-10-CM | POA: Diagnosis not present

## 2021-12-08 DIAGNOSIS — I82401 Acute embolism and thrombosis of unspecified deep veins of right lower extremity: Secondary | ICD-10-CM

## 2021-12-08 LAB — RAD ONC ARIA SESSION SUMMARY
Course Elapsed Days: 21
Plan Fractions Treated to Date: 15
Plan Prescribed Dose Per Fraction: 1.8 Gy
Plan Total Fractions Prescribed: 25
Plan Total Prescribed Dose: 45 Gy
Reference Point Dosage Given to Date: 27 Gy
Reference Point Session Dosage Given: 1.8 Gy
Session Number: 15

## 2021-12-08 LAB — CMP (CANCER CENTER ONLY)
ALT: 22 U/L (ref 0–44)
AST: 21 U/L (ref 15–41)
Albumin: 3.1 g/dL — ABNORMAL LOW (ref 3.5–5.0)
Alkaline Phosphatase: 64 U/L (ref 38–126)
Anion gap: 7 (ref 5–15)
BUN: 14 mg/dL (ref 8–23)
CO2: 32 mmol/L (ref 22–32)
Calcium: 9.5 mg/dL (ref 8.9–10.3)
Chloride: 100 mmol/L (ref 98–111)
Creatinine: 0.78 mg/dL (ref 0.44–1.00)
GFR, Estimated: >60 mL/min (ref >60)
Glucose, Bld: 103 mg/dL — ABNORMAL HIGH (ref 70–99)
Potassium: 2.9 mmol/L — ABNORMAL LOW (ref 3.5–5.1)
Sodium: 139 mmol/L (ref 135–145)
Total Bilirubin: 0.4 mg/dL (ref 0.3–1.2)
Total Protein: 7.8 g/dL (ref 6.5–8.1)

## 2021-12-08 LAB — CBC WITH DIFFERENTIAL (CANCER CENTER ONLY)
Abs Immature Granulocytes: 0.02 10*3/uL (ref 0.00–0.07)
Basophils Absolute: 0 10*3/uL (ref 0.0–0.1)
Basophils Relative: 0 %
Eosinophils Absolute: 0 10*3/uL (ref 0.0–0.5)
Eosinophils Relative: 1 %
HCT: 28.5 % — ABNORMAL LOW (ref 36.0–46.0)
Hemoglobin: 9.2 g/dL — ABNORMAL LOW (ref 12.0–15.0)
Immature Granulocytes: 0 %
Lymphocytes Relative: 4 %
Lymphs Abs: 0.2 10*3/uL — ABNORMAL LOW (ref 0.7–4.0)
MCH: 30.3 pg (ref 26.0–34.0)
MCHC: 32.3 g/dL (ref 30.0–36.0)
MCV: 93.8 fL (ref 80.0–100.0)
Monocytes Absolute: 0.6 10*3/uL (ref 0.1–1.0)
Monocytes Relative: 12 %
Neutro Abs: 3.8 10*3/uL (ref 1.7–7.7)
Neutrophils Relative %: 83 %
Platelet Count: 198 10*3/uL (ref 150–400)
RBC: 3.04 MIL/uL — ABNORMAL LOW (ref 3.87–5.11)
RDW: 14.1 % (ref 11.5–15.5)
WBC Count: 4.6 10*3/uL (ref 4.0–10.5)
nRBC: 0 % (ref 0.0–0.2)

## 2021-12-08 MED ORDER — APIXABAN (ELIQUIS) VTE STARTER PACK (10MG AND 5MG)
ORAL_TABLET | ORAL | 0 refills | Status: DC
  Filled 2021-12-08: qty 74, 30d supply, fill #0

## 2021-12-08 MED ORDER — POTASSIUM CHLORIDE CRYS ER 20 MEQ PO TBCR
20.0000 meq | EXTENDED_RELEASE_TABLET | Freq: Two times a day (BID) | ORAL | 0 refills | Status: DC
  Filled 2021-12-08: qty 28, 14d supply, fill #0

## 2021-12-08 NOTE — Patient Instructions (Signed)
-  We are starting you on a blood thinner today because of the blood clot in your leg.  If your vaginal bleeding becomes heavy or you have any other signs of bleeding you will need to call us at the cancer center to let us know or seek emergency treatment if bleeding is severe.  -You should be careful to avoid falls waiver if you have 1 you should be evaluated in the emergency department to make sure there is no serious injuries.  -Your potassium was low today.  This can be related to your blood pressure medications.  Please follow-up with your cardiologist as we discussed.  I have sent potassium pills to your pharmacy.  Please take as prescribed.  Try to eat more foods rich with potassium.  I have included information in your discharge paperwork about that.  If any of your symptoms worsen and you need immediate medical treatment please call 911 or go to the closest ER.   I will call you tomorrow to let you know which doctor you will be following up with for the blood clot.

## 2021-12-08 NOTE — Progress Notes (Signed)
Symptom Management Consult note [facility]    Patient Care Team: [name], MD as PCP - General (Internal Medicine)    Name of the patient: Megan Ryan  [id number]  08-23-1935   Date of visit: 12/08/2021    Chief complaint/ Reason for visit- leg swelling and + DVT on [place]  Oncology History Overview Note  PD-L1 CPS 50%   Vaginal cancer ([place])  09/16/2021 Initial Diagnosis   The patient initially presented on 3/24 with several weeks of postmenopausal bleeding.    09/17/2021 Imaging   Ct abdomen and pelvis New 6 cm ill-defined soft tissue mass centered in the region of the cervix and upper vagina, with possible involvement of the rectum and/or bladder. This is suspicious for cervical carcinoma. Recommend GYN consultation, and consider pelvic MRI without and with contrast for further evaluation.   Several tiny calcified uterine fibroids.   No evidence of metastatic disease.   Stable small benign left adrenal adenoma.   09/27/2021 Pathology Results   A. VAGINAL SIDEWALL, BIOPSY:  -  Invasive squamous cell carcinoma, keratinizing  -  See comment   10/02/2021 Initial Diagnosis   Cervical cancer ([place])   10/08/2021 PET scan   1. Marked hypermetabolism associated with the cervical mass consistent with cervical cancer. 2. Small hypermetabolic right pelvic sidewall node is suspicious for metastatic adenopathy. 3. No omental or peritoneal surface lesions are identified.  4. No findings for metastatic disease involving the chest or bony structures. 5. Diffuse hypermetabolism in the thyroid gland likely reflecting thyroiditis. 6. Age related atherosclerotic disease.       10/14/2021 Cancer Staging   Staging form: Vagina, AJCC 8th Edition - Clinical stage from 10/14/2021: Stage III (cT2b, cN1, cM0) - Signed by Heath Lark, MD on 10/14/2021 Stage prefix: Initial diagnosis     Current Therapy: radiation only  Interval history- Megan Ryan is a 86 y.o. with  oncologic history as above presenting to [facility] today with chief complaint of right leg swelling and [place] positive for DVT yesterday.  Patient is accompanied by her sister and daughter who provide additional history.  Patient states she noticed swelling in her right leg x2 days ago.  She reports she has 7 out of 10 aching pain with movement.  She denies any pain at rest.  She has been taking Tylenol for the pain and thinks it is helping some.  She was seen in radiation yesterday and reported the swelling to a provider who ordered the ultrasound.  Patient tells me she has no chest pain or shortness of breath.  No history of blood clots.  Patient states for treatment of her vaginal cancer she has chosen to only undergo radiation therapy as she wants to continue to have the best quality of life and feels that chemotherapy would be to intensive and aggressive.  She denies any injury to her leg.  She denies any fever, chills, numbness, syncope, palpitations, lightheadedness, dizziness, tingling or rash.      ROS  All other systems are reviewed and are negative for acute change except as noted in the HPI.    No Known Allergies   Past Medical History:  Diagnosis Date   Arthritis    knee and back pain   Cervical cancer ([place]) 10/02/2021   Diabetes mellitus ([place]) 06/27/2016   Hypertension      Past Surgical History:  Procedure Laterality Date   HERNIA REPAIR  2003   repair with mesh   HYSTERECTOMY  ABDOMINAL WITH SALPINGO-OOPHORECTOMY  1960   doesn't remember why, doesn't remember being on HRT (never had menopausal symptoms)    Social History   Socioeconomic History   Marital status: Divorced    Spouse name: Not on file   Number of children: 2   Years of education: Not on file   Highest education level: Not on file  Occupational History   Not on file  Tobacco Use   Smoking status: Never   Smokeless tobacco: Never  Vaping Use   Vaping Use: Never used  Substance and Sexual Activity    Alcohol use: No   Drug use: No   Sexual activity: Not Currently  Other Topics Concern   Not on file  Social History Narrative   Not on file   Social Determinants of Health   Financial Resource Strain: Not on file  Food Insecurity: Not on file  Transportation Needs: Not on file  Physical Activity: Not on file  Stress: Not on file  Social Connections: Not on file  Intimate Partner Violence: Not on file    Family History  Problem Relation Age of Onset   Diabetes Mother    Diabetes Father    Breast cancer Sister    Diabetes Sister    Cancer Sister        thinks may have had colon or stomach cancer   Prostate cancer Brother    Diabetes Brother    Cancer Brother        Liver cancer   CAD Neg Hx    Colon cancer Neg Hx    Ovarian cancer Neg Hx    Endometrial cancer Neg Hx    Pancreatic cancer Neg Hx      Current Outpatient Medications:    APIXABAN (ELIQUIS) VTE STARTER PACK (10MG AND 5MG), Take as directed on package: start with two-82m tablets twice daily for 7 days. On day 8, switch to one-551mtablet twice daily., Disp: 74 tablet, Rfl: 0   potassium chloride SA (KLOR-CON M) 20 MEQ tablet, Take 1 tablet (20 mEq total) by mouth 2 (two) times daily for 14 days., Disp: 28 tablet, Rfl: 0   acetaminophen (TYLENOL) 325 MG tablet, Take 650 mg by mouth as needed., Disp: , Rfl:    amLODipine (NORVASC) 5 MG tablet, Take 10 mg by mouth daily., Disp: , Rfl:    aspirin EC 81 MG tablet, Take 81 mg by mouth daily., Disp: , Rfl:    hydrochlorothiazide (HYDRODIURIL) 25 MG tablet, TAKE 1 TABLET BY MOUTH IN THE MORNING (Patient taking differently: Take 25 mg by mouth daily.), Disp: 90 tablet, Rfl: 0   NEOMYCIN-POLYMYXIN-HYDROCORTISONE (CORTISPORIN) 1 % SOLN OTIC solution, Apply 1-2 drops to toe BID after soaking (Patient not taking: Reported on 10/11/2021), Disp: 10 mL, Rfl: 1   ondansetron (ZOFRAN) 4 MG tablet, Take 1 tablet (4 mg total) by mouth every 8 (eight) hours as needed for nausea or  vomiting., Disp: 20 tablet, Rfl: 0   polyethylene glycol (MIRALAX / GLYCOLAX) 17 g packet, Take 17 g by mouth daily., Disp: , Rfl:   PHYSICAL EXAM: ECOG FS:1 - Symptomatic but completely ambulatory    Vitals:   12/08/21 1117  BP: (!) 145/63  Pulse: 80  Resp: 18  Temp: 99.9 F (37.7 C)  TempSrc: Oral  SpO2: 100%  Weight: 212 lb (96.2 kg)   Physical Exam Vitals and nursing note reviewed.  Constitutional:      Appearance: She is well-developed. She is not ill-appearing or toxic-appearing.  HENT:     Head: Normocephalic and atraumatic.     Nose: Nose normal.  Eyes:     General: No scleral icterus.       Right eye: No discharge.        Left eye: No discharge.     Conjunctiva/sclera: Conjunctivae normal.  Neck:     Vascular: No JVD.  Cardiovascular:     Rate and Rhythm: Normal rate and regular rhythm.     Pulses: Normal pulses.     Heart sounds: Normal heart sounds.  Pulmonary:     Effort: Pulmonary effort is normal. No respiratory distress.     Breath sounds: Normal breath sounds. No stridor. No wheezing, rhonchi or rales.  Chest:     Chest wall: No tenderness.  Abdominal:     General: There is no distension.  Musculoskeletal:        General: Normal range of motion.     Cervical back: Normal range of motion.     Comments: + Homan's sign on RLE. 1+ piting edema of RLE. Full ROM of RLE. Compartments soft in RLE.  Skin:    General: Skin is warm and dry.     Capillary Refill: Capillary refill takes less than 2 seconds.     Comments: Equal tactile temperature in all extremities  Neurological:     Mental Status: She is oriented to person, place, and time.     GCS: GCS eye subscore is 4. GCS verbal subscore is 5. GCS motor subscore is 6.     Comments: Fluent speech, no facial droop.  Psychiatric:        Behavior: Behavior normal.        LABORATORY DATA: I have reviewed the data as listed    Latest Ref Rng & Units 12/08/2021   11:00 AM 09/17/2021   11:20 AM  07/09/2021   12:00 AM  CBC  WBC 4.0 - 10.5 K/uL 4.6  4.8  4.2   Hemoglobin 12.0 - 15.0 g/dL 9.2  11.5  11.2   Hematocrit 36.0 - 46.0 % 28.5  36.9  34.7   Platelets 150 - 400 K/uL 198  208  217         Latest Ref Rng & Units 12/08/2021   11:00 AM 09/17/2021   11:20 AM 07/09/2021   12:00 AM  CMP  Glucose 70 - 99 mg/dL 103  83  78   BUN 8 - 23 mg/dL 14  16  20    Creatinine 0.44 - 1.00 mg/dL 0.78  0.77  1.06   Sodium 135 - 145 mmol/L 139  139  139   Potassium 3.5 - 5.1 mmol/L 2.9  3.6  4.3   Chloride 98 - 111 mmol/L 100  106  102   CO2 22 - 32 mmol/L 32  28  28   Calcium 8.9 - 10.3 mg/dL 9.5  9.8  9.5   Total Protein 6.5 - 8.1 g/dL 7.8  8.0    Total Bilirubin 0.3 - 1.2 mg/dL 0.4  0.3    Alkaline Phos 38 - 126 U/L 64  77    AST 15 - 41 U/L 21  16    ALT 0 - 44 U/L 22  14         RADIOGRAPHIC STUDIES: I have personally reviewed the radiological images as listed and agreed with the findings in the report. No images are attached to the encounter. VAS [place] LOWER EXTREMITY VENOUS (DVT)  Result Date: 12/07/2021  Lower Venous DVT Study Patient Name:  Megan Ryan  Date of Exam:   12/07/2021 Medical Rec #: [id number]        Accession #:    [id number] Date of Birth: 01/05/1936        Patient Gender: F Patient Age:   20 years Exam Location:  [facility] Procedure:      VAS [place] LOWER EXTREMITY VENOUS (DVT) Referring Phys: Gery Pray --------------------------------------------------------------------------------  Indications: Pain, and Swelling.  Risk Factors: Cancer Cancer patient. Comparison Study: No previous exam noted. Performing Technologist: Bobetta Lime  Examination Guidelines: A complete evaluation includes B-mode imaging, spectral Doppler, color Doppler, and power Doppler as needed of all accessible portions of each vessel. Bilateral testing is considered an integral part of a complete examination. Limited examinations for reoccurring indications may be performed as noted. The  reflux portion of the exam is performed with the patient in reverse Trendelenburg.  +---------+---------------+---------+-----------+----------+--------------+ RIGHT    CompressibilityPhasicitySpontaneityPropertiesThrombus Aging +---------+---------------+---------+-----------+----------+--------------+ CFV      Full           Yes      Yes                                 +---------+---------------+---------+-----------+----------+--------------+ SFJ      Full                                                        +---------+---------------+---------+-----------+----------+--------------+ FV Prox  Partial        Yes      Yes                  Acute          +---------+---------------+---------+-----------+----------+--------------+ FV Mid   None           No       No                   Acute          +---------+---------------+---------+-----------+----------+--------------+ FV DistalNone           No       No                   Acute          +---------+---------------+---------+-----------+----------+--------------+ PFV      Full                                         Acute          +---------+---------------+---------+-----------+----------+--------------+ POP      None           No       No                   Acute          +---------+---------------+---------+-----------+----------+--------------+ PTV      None           No       No                   Acute          +---------+---------------+---------+-----------+----------+--------------+  PERO     None           No       No                   Acute          +---------+---------------+---------+-----------+----------+--------------+ Gastroc  None           No       No                   Acute          +---------+---------------+---------+-----------+----------+--------------+   +----+---------------+---------+-----------+----------+--------------+  LEFTCompressibilityPhasicitySpontaneityPropertiesThrombus Aging +----+---------------+---------+-----------+----------+--------------+ CFV Full           Yes      Yes                                 +----+---------------+---------+-----------+----------+--------------+     Summary: RIGHT: - Findings consistent with acute deep vein thrombosis involving the right femoral vein, right popliteal vein, right posterior tibial veins, right peroneal veins, and right gastrocnemius veins.  LEFT: - No evidence of common femoral vein obstruction.  *See table(s) above for measurements and observations. Electronically signed by Deitra Mayo MD on 12/07/2021 at 3:15:02 PM.    Final      ASSESSMENT & PLAN: Patient is a 86 y.o. female  with oncologic history of vaginal cancer previously followed by Dr. Alvy Bimler.  I have viewed most recent oncology and radiations notes and lab work.    #)acute deep vein thrombosis- Patient had [place] yesterday ordered by radiation oncologist. I viewed report which is remarkable for acute DVT involving the right femoral vein, right popliteal vein, right posterior tibial veins, right peroneal veins, and right gastrocnemius veins. CBC today shows hemoglobin is 9.2 and her baseline appears to be around 11 based on previous labs. Platelet count is normal. Patient is having light vaginal bleeding daily for the last 3 months. I had lengthy discussion with patient in regards to DVT management of Eliquis vs IVC filter and the risks associated with each. She is already taking 100m of aspirin daily x several years. Patient would like to try Eliquis and closely monitor bleeding at home. She knows if bleeding worsens she would need to call the cancer center immediately or go to the ED. Also discussed precautions to take while on a blood thinner. I will have patient return to clinic in 1 week to recheck hemoglobin. I will reach out to  medical oncologist to discuss long term treatment of  DVT.   #)Hypokalemia-CMP today shows hypokalemia 2.9.  Patient has history of the same.  I viewed her medication list and she is currently taking HCTZ 12.5 mg in the morning and amlodipine 10 mg in the evening for hypertension that is managed by cardiologist. I will send prescription to pharmacy for PO potassium and patient will need to follow up with cardiologist to further discuss hypertension management.   Strict ED precautions discussed should symptoms worsen.    Visit Diagnosis: 1. Acute deep vein thrombosis (DVT) of right lower extremity, unspecified vein ([name])   2. Hypokalemia      No orders of the defined types were placed in this encounter.   All questions were answered. The patient knows to call the clinic with any problems, questions or concerns. No barriers to learning was detected.  I have spent a total of 30 minutes minutes of face-to-face and  non-face-to-face time, preparing to see the patient, obtaining and/or reviewing separately obtained history, performing a medically appropriate examination, counseling and educating the patient, ordering tests,  documenting clinical information in the electronic health record, and care coordination.     Thank you for allowing me to participate in the care of this patient.    Barrie Folk, PA-C Department of Hematology/Oncology [facility] at [facility] Phone: [phone]  [phone]    12/08/2021 4:39 PM

## 2021-12-08 NOTE — Progress Notes (Signed)
CBC order per Eloise Harman., PA.

## 2021-12-09 ENCOUNTER — Other Ambulatory Visit: Payer: Self-pay

## 2021-12-09 ENCOUNTER — Telehealth: Payer: Self-pay

## 2021-12-09 ENCOUNTER — Other Ambulatory Visit: Payer: Self-pay | Admitting: Hematology and Oncology

## 2021-12-09 ENCOUNTER — Ambulatory Visit
Admission: RE | Admit: 2021-12-09 | Discharge: 2021-12-09 | Disposition: A | Discharge status: Home / Self Care | Payer: Medicare HMO | Source: Ambulatory Visit | Attending: Radiation Oncology | Admitting: Radiation Oncology

## 2021-12-09 DIAGNOSIS — C52 Malignant neoplasm of vagina: Secondary | ICD-10-CM

## 2021-12-09 DIAGNOSIS — N939 Abnormal uterine and vaginal bleeding, unspecified: Secondary | ICD-10-CM | POA: Insufficient documentation

## 2021-12-09 LAB — RAD ONC ARIA SESSION SUMMARY
Course Elapsed Days: 22
Plan Fractions Treated to Date: 16
Plan Prescribed Dose Per Fraction: 1.8 Gy
Plan Total Fractions Prescribed: 25
Plan Total Prescribed Dose: 45 Gy
Reference Point Dosage Given to Date: 28.8 Gy
Reference Point Session Dosage Given: 1.8 Gy
Session Number: 16

## 2021-12-09 NOTE — Telephone Encounter (Signed)
Called and scheduled appts on 6/20. She verbalized understanding and is aware of appt date/time. Reminded to check in at registration after radiation.

## 2021-12-09 NOTE — Telephone Encounter (Signed)
-----   Message from Heath Lark, MD sent at 12/09/2021  8:17 AM EDT ----- Pls schedule labs and see me after her radiation next Tuesday

## 2021-12-10 ENCOUNTER — Other Ambulatory Visit: Payer: Self-pay

## 2021-12-10 ENCOUNTER — Ambulatory Visit
Admission: RE | Admit: 2021-12-10 | Discharge: 2021-12-10 | Disposition: A | Discharge status: Home / Self Care | Payer: Medicare HMO | Source: Ambulatory Visit | Attending: Radiation Oncology | Admitting: Radiation Oncology

## 2021-12-10 DIAGNOSIS — C52 Malignant neoplasm of vagina: Secondary | ICD-10-CM | POA: Diagnosis not present

## 2021-12-10 LAB — RAD ONC ARIA SESSION SUMMARY
Course Elapsed Days: 23
Plan Fractions Treated to Date: 17
Plan Prescribed Dose Per Fraction: 1.8 Gy
Plan Total Fractions Prescribed: 25
Plan Total Prescribed Dose: 45 Gy
Reference Point Dosage Given to Date: 30.6 Gy
Reference Point Session Dosage Given: 1.8 Gy
Session Number: 17

## 2021-12-11 ENCOUNTER — Emergency Department (HOSPITAL_COMMUNITY): Payer: Medicare HMO

## 2021-12-11 ENCOUNTER — Emergency Department (HOSPITAL_COMMUNITY)
Admission: EM | Admit: 2021-12-11 | Discharge: 2021-12-12 | Disposition: A | Discharge status: Home / Self Care | Payer: Medicare HMO | Attending: Emergency Medicine | Admitting: Emergency Medicine

## 2021-12-11 ENCOUNTER — Encounter (HOSPITAL_COMMUNITY): Payer: Self-pay

## 2021-12-11 DIAGNOSIS — I82401 Acute embolism and thrombosis of unspecified deep veins of right lower extremity: Secondary | ICD-10-CM | POA: Insufficient documentation

## 2021-12-11 DIAGNOSIS — Z79899 Other long term (current) drug therapy: Secondary | ICD-10-CM | POA: Insufficient documentation

## 2021-12-11 DIAGNOSIS — M79604 Pain in right leg: Secondary | ICD-10-CM | POA: Diagnosis present

## 2021-12-11 DIAGNOSIS — Z7901 Long term (current) use of anticoagulants: Secondary | ICD-10-CM | POA: Insufficient documentation

## 2021-12-11 DIAGNOSIS — Z7982 Long term (current) use of aspirin: Secondary | ICD-10-CM | POA: Diagnosis not present

## 2021-12-11 DIAGNOSIS — C539 Malignant neoplasm of cervix uteri, unspecified: Secondary | ICD-10-CM | POA: Diagnosis not present

## 2021-12-11 DIAGNOSIS — I10 Essential (primary) hypertension: Secondary | ICD-10-CM | POA: Diagnosis not present

## 2021-12-11 DIAGNOSIS — M7989 Other specified soft tissue disorders: Secondary | ICD-10-CM | POA: Insufficient documentation

## 2021-12-11 LAB — CBC WITH DIFFERENTIAL/PLATELET
Abs Immature Granulocytes: 0.03 10*3/uL (ref 0.00–0.07)
Basophils Absolute: 0 10*3/uL (ref 0.0–0.1)
Basophils Relative: 0 %
Eosinophils Absolute: 0 10*3/uL (ref 0.0–0.5)
Eosinophils Relative: 1 %
HCT: 28 % — ABNORMAL LOW (ref 36.0–46.0)
Hemoglobin: 8.9 g/dL — ABNORMAL LOW (ref 12.0–15.0)
Immature Granulocytes: 1 %
Lymphocytes Relative: 6 %
Lymphs Abs: 0.3 10*3/uL — ABNORMAL LOW (ref 0.7–4.0)
MCH: 30.5 pg (ref 26.0–34.0)
MCHC: 31.8 g/dL (ref 30.0–36.0)
MCV: 95.9 fL (ref 80.0–100.0)
Monocytes Absolute: 0.5 10*3/uL (ref 0.1–1.0)
Monocytes Relative: 13 %
Neutro Abs: 3.2 10*3/uL (ref 1.7–7.7)
Neutrophils Relative %: 79 %
Platelets: 231 10*3/uL (ref 150–400)
RBC: 2.92 MIL/uL — ABNORMAL LOW (ref 3.87–5.11)
RDW: 14.4 % (ref 11.5–15.5)
WBC: 4.1 10*3/uL (ref 4.0–10.5)
nRBC: 0 % (ref 0.0–0.2)

## 2021-12-11 LAB — I-STAT CHEM 8, ED
BUN: 13 mg/dL (ref 8–23)
Calcium, Ion: 1.12 mmol/L — ABNORMAL LOW (ref 1.15–1.40)
Chloride: 98 mmol/L (ref 98–111)
Creatinine, Ser: 0.7 mg/dL (ref 0.44–1.00)
Glucose, Bld: 109 mg/dL — ABNORMAL HIGH (ref 70–99)
HCT: 27 % — ABNORMAL LOW (ref 36.0–46.0)
Hemoglobin: 9.2 g/dL — ABNORMAL LOW (ref 12.0–15.0)
Potassium: 3.7 mmol/L (ref 3.5–5.1)
Sodium: 138 mmol/L (ref 135–145)
TCO2: 29 mmol/L (ref 22–32)

## 2021-12-11 LAB — COMPREHENSIVE METABOLIC PANEL
ALT: 40 U/L (ref 0–44)
AST: 33 U/L (ref 15–41)
Albumin: 2.9 g/dL — ABNORMAL LOW (ref 3.5–5.0)
Alkaline Phosphatase: 55 U/L (ref 38–126)
Anion gap: 6 (ref 5–15)
BUN: 14 mg/dL (ref 8–23)
CO2: 31 mmol/L (ref 22–32)
Calcium: 9 mg/dL (ref 8.9–10.3)
Chloride: 103 mmol/L (ref 98–111)
Creatinine, Ser: 0.73 mg/dL (ref 0.44–1.00)
GFR, Estimated: >60 mL/min (ref >60)
Glucose, Bld: 112 mg/dL — ABNORMAL HIGH (ref 70–99)
Potassium: 3.7 mmol/L (ref 3.5–5.1)
Sodium: 140 mmol/L (ref 135–145)
Total Bilirubin: 0.4 mg/dL (ref 0.3–1.2)
Total Protein: 7.5 g/dL (ref 6.5–8.1)

## 2021-12-11 LAB — CK: Total CK: 91 U/L (ref 38–234)

## 2021-12-11 MED ORDER — OXYCODONE-ACETAMINOPHEN 5-325 MG PO TABS
1.0000 | ORAL_TABLET | Freq: Once | ORAL | Status: AC
  Administered 2021-12-11: 1 via ORAL
  Filled 2021-12-11: qty 1

## 2021-12-11 MED ORDER — OXYCODONE HCL 5 MG PO TABS: 5.0000 mg | ORAL_TABLET | ORAL | 0 refills | Status: DC | PRN

## 2021-12-11 MED ORDER — MORPHINE SULFATE (PF) 4 MG/ML IV SOLN
4.0000 mg | Freq: Once | INTRAVENOUS | Status: AC
  Administered 2021-12-11: 4 mg via INTRAVENOUS
  Filled 2021-12-11: qty 1

## 2021-12-11 MED ORDER — IOHEXOL 300 MG/ML  SOLN
100.0000 mL | Freq: Once | INTRAMUSCULAR | Status: AC | PRN
  Administered 2021-12-11: 100 mL via INTRAVENOUS

## 2021-12-11 NOTE — ED Notes (Signed)
Patient transported to CT 

## 2021-12-11 NOTE — ED Provider Notes (Signed)
Megan Ryan Arrival date & time: 12/11/21  2101     History  Chief Complaint  Patient presents with   DVT    Megan Ryan is a 86 y.o. female history of DVT and Eliquis, cervical cancer here presenting with worsening right leg pain and swelling.  Patient states that she was seen at oncology office 3 days ago and was diagnosed with DVT.  Patient was started on Eliquis and she has been taking it.  She states that despite taking the Eliquis, she has been having progressively worsening right leg swelling.  Denies any chest pain or shortness of breath.  Patient is on radiation currently for cervical cancer.  The history is provided by the patient.       Home Medications Prior to Admission medications   Medication Sig Start Date End Date Taking? Authorizing Provider  acetaminophen (TYLENOL) 325 MG tablet Take 650 mg by mouth as needed.    [provider]  amLODipine (NORVASC) 5 MG tablet Take 10 mg by mouth daily. 07/06/19   [provider]  APIXABAN [facility] [place]) VTE STARTER PACK ('10MG'$  AND '5MG'$ ) Take as directed on package: start with two-'5mg'$  tablets twice daily for 7 days. On day 8, switch to one-'5mg'$  tablet twice daily. 12/08/21   Barrie Folk, PA-C  aspirin EC 81 MG tablet Take 81 mg by mouth daily.    [provider]  hydrochlorothiazide (HYDRODIURIL) 25 MG tablet TAKE 1 TABLET BY MOUTH IN THE MORNING Patient taking differently: Take 25 mg by mouth daily. 09/09/21   Tolia, Sunit, DO  NEOMYCIN-POLYMYXIN-HYDROCORTISONE (CORTISPORIN) 1 % SOLN OTIC solution Apply 1-2 drops to toe BID after soaking Patient not taking: Reported on 10/11/2021 07/22/21   Hyatt, Max T, DPM  ondansetron (ZOFRAN) 4 MG tablet Take 1 tablet (4 mg total) by mouth every 8 (eight) hours as needed for nausea or vomiting. 11/30/21   Gery Pray, MD  polyethylene glycol (MIRALAX / GLYCOLAX) 17 g packet Take 17 g by mouth  daily.    [provider]  potassium chloride SA (KLOR-CON M) 20 MEQ tablet Take 1 tablet (20 mEq total) by mouth 2 (two) times daily for 14 days. 12/08/21 12/22/21  Barrie Folk, PA-C      Allergies    Patient has no known allergies.    Review of Systems   Review of Systems  Cardiovascular:  Positive for leg swelling.  All other systems reviewed and are negative.   Physical Exam Updated Vital Signs BP (!) 165/81   Pulse 76   Temp 100 F (37.8 C) (Oral)   Resp 16   Ht '5\' 7"'$  (1.702 m)   Wt 97.1 kg   SpO2 98%   BMI 33.52 kg/m  Physical Exam Vitals and nursing note reviewed.  Constitutional:      Comments: Chronically ill, uncomfortable  HENT:     Head: Normocephalic.     Nose: Nose normal.     Mouth/Throat:     Mouth: Mucous membranes are moist.  Eyes:     Extraocular Movements: Extraocular movements intact.     Pupils: Pupils are equal, round, and reactive to light.  Cardiovascular:     Rate and Rhythm: Normal rate and regular rhythm.     Pulses: Normal pulses.     Heart sounds: Normal heart sounds.  Pulmonary:     Effort: Pulmonary effort is normal.     Breath sounds: Normal breath sounds.  Abdominal:     General: Abdomen is flat.     Palpations: Abdomen is soft.  Musculoskeletal:     Cervical back: Normal range of motion and neck supple.     Comments: Patient has right leg 1+ edema and right calf tenderness.  There is some venous stasis changes but no obvious cellulitis.  No obvious skin breakdown.  Skin:    Capillary Refill: Capillary refill takes less than 2 seconds.  Neurological:     General: No focal deficit present.     Mental Status: She is oriented to person, place, and time.  Psychiatric:        Mood and Affect: Mood normal.        Behavior: Behavior normal.     ED Results / Procedures / Treatments   Labs (all labs ordered are listed, but only abnormal results are displayed) Labs Reviewed  CBC WITH DIFFERENTIAL/PLATELET -  Abnormal; Notable for the following components:      Result Value   RBC 2.92 (*)    Hemoglobin 8.9 (*)    HCT 28.0 (*)    Lymphs Abs 0.3 (*)    All other components within normal limits  I-STAT CHEM 8, ED - Abnormal; Notable for the following components:   Glucose, Bld 109 (*)    Calcium, Ion 1.12 (*)    Hemoglobin 9.2 (*)    HCT 27.0 (*)    All other components within normal limits  COMPREHENSIVE METABOLIC PANEL  CK    EKG None  Radiology No results found.  Procedures Procedures    Medications Ordered in ED Medications  morphine (PF) 4 MG/ML injection 4 mg (4 mg Intravenous Given 12/11/21 2159)    ED Course/ Medical Decision Making/ A&P                           Medical Decision Making Megan Ryan is a 86 y.o. female here with leg pain and calf pain.  Patient has cervical cancer and has radiation to the area.  I wonder if she had radiation changes to the area that causes her extensive DVT.  I reviewed her DVT study from several days ago.  She has extensive DVT on the right side from common femoral all the way down to the calf.  Patient is compliant with her Eliquis and has no signs of PE.  We will get CT abdomen pelvis.  Will give pain medicine and reassess  11:24 PM I reviewed the patient's labs.  Hemoglobin is 8.9 which is baseline.  CT abdomen pelvis is pending. I suspect radiation changes causing her DVT.  Signed out to Dr. Laverta Baltimore from the ED.  Anticipate patient can discharge home and can continue Eliquis.  I told patient that she can take some oxycodone as needed for pain and that she needs repeat ultrasound with oncology in a month  Problems Addressed: Acute thromboembolism of deep veins of right lower extremity ([place]): acute illness or injury Malignant neoplasm of cervix, unspecified site [facility]): chronic illness or injury  Amount and/or Complexity of Data Reviewed Labs: ordered. Decision-making details documented in ED Course. Radiology:  ordered.  Risk Prescription drug management.    Final Clinical Impression(s) / ED Diagnoses Final diagnoses:  None    Rx / DC Orders ED Discharge Orders     None         Drenda Freeze, MD 12/11/21 2329

## 2021-12-11 NOTE — ED Triage Notes (Signed)
Pt from home via [facility] c/c increase pain to right leg, dx with a DVT on Tuesday , started on Eliquis. Pain to right calf, swelling noted, and warmth to RLE. VSS, A&O x4.

## 2021-12-11 NOTE — Discharge Instructions (Addendum)
You have a DVT that is caused by radiation for your cervical cancer  You need to continue taking Eliquis as prescribed  You may take some oxycodone as needed for severe pain.  As I mentioned to you, it will take several weeks for the blood clot to resolve  You need to follow-up with your primary care doctor and oncologist  You likely will need another repeat ultrasound in a month  Return to ER if you have worse right leg swelling, chest pain, shortness of breath

## 2021-12-12 NOTE — ED Notes (Signed)
Patient verbalizes understanding of discharge instructions. Opportunity for questioning and answers were provided. Armband removed by staff, pt discharged from ED to home with family. Ambulatory at DC, A&Ox4

## 2021-12-13 ENCOUNTER — Other Ambulatory Visit: Payer: Self-pay

## 2021-12-13 ENCOUNTER — Ambulatory Visit
Admission: RE | Admit: 2021-12-13 | Discharge: 2021-12-13 | Disposition: A | Discharge status: Home / Self Care | Payer: Medicare HMO | Source: Ambulatory Visit | Attending: Radiation Oncology | Admitting: Radiation Oncology

## 2021-12-13 DIAGNOSIS — C52 Malignant neoplasm of vagina: Secondary | ICD-10-CM | POA: Diagnosis not present

## 2021-12-13 LAB — RAD ONC ARIA SESSION SUMMARY
Course Elapsed Days: 26
Plan Fractions Treated to Date: 18
Plan Prescribed Dose Per Fraction: 1.8 Gy
Plan Total Fractions Prescribed: 25
Plan Total Prescribed Dose: 45 Gy
Reference Point Dosage Given to Date: 32.4 Gy
Reference Point Session Dosage Given: 1.8 Gy
Session Number: 18

## 2021-12-14 ENCOUNTER — Inpatient Hospital Stay (HOSPITAL_BASED_OUTPATIENT_CLINIC_OR_DEPARTMENT_OTHER): Payer: Medicare HMO | Admitting: Hematology and Oncology

## 2021-12-14 ENCOUNTER — Encounter: Payer: Self-pay | Admitting: Hematology and Oncology

## 2021-12-14 ENCOUNTER — Inpatient Hospital Stay: Payer: Medicare HMO

## 2021-12-14 ENCOUNTER — Other Ambulatory Visit: Payer: Self-pay

## 2021-12-14 ENCOUNTER — Ambulatory Visit
Admission: RE | Admit: 2021-12-14 | Discharge: 2021-12-14 | Disposition: A | Discharge status: Home / Self Care | Payer: Medicare HMO | Source: Ambulatory Visit | Attending: Radiation Oncology | Admitting: Radiation Oncology

## 2021-12-14 ENCOUNTER — Ambulatory Visit: Payer: Medicare HMO

## 2021-12-14 DIAGNOSIS — C52 Malignant neoplasm of vagina: Secondary | ICD-10-CM

## 2021-12-14 DIAGNOSIS — D638 Anemia in other chronic diseases classified elsewhere: Secondary | ICD-10-CM | POA: Diagnosis not present

## 2021-12-14 DIAGNOSIS — I824Z1 Acute embolism and thrombosis of unspecified deep veins of right distal lower extremity: Secondary | ICD-10-CM | POA: Diagnosis not present

## 2021-12-14 DIAGNOSIS — N939 Abnormal uterine and vaginal bleeding, unspecified: Secondary | ICD-10-CM

## 2021-12-14 LAB — RAD ONC ARIA SESSION SUMMARY
Course Elapsed Days: 27
Plan Fractions Treated to Date: 19
Plan Prescribed Dose Per Fraction: 1.8 Gy
Plan Total Fractions Prescribed: 25
Plan Total Prescribed Dose: 45 Gy
Reference Point Dosage Given to Date: 34.2 Gy
Reference Point Session Dosage Given: 1.8 Gy
Session Number: 19

## 2021-12-14 LAB — CBC WITH DIFFERENTIAL/PLATELET
Abs Immature Granulocytes: 0.03 10*3/uL (ref 0.00–0.07)
Basophils Absolute: 0 10*3/uL (ref 0.0–0.1)
Basophils Relative: 0 %
Eosinophils Absolute: 0 10*3/uL (ref 0.0–0.5)
Eosinophils Relative: 1 %
HCT: 28 % — ABNORMAL LOW (ref 36.0–46.0)
Hemoglobin: 9 g/dL — ABNORMAL LOW (ref 12.0–15.0)
Immature Granulocytes: 1 %
Lymphocytes Relative: 5 %
Lymphs Abs: 0.2 10*3/uL — ABNORMAL LOW (ref 0.7–4.0)
MCH: 30.6 pg (ref 26.0–34.0)
MCHC: 32.1 g/dL (ref 30.0–36.0)
MCV: 95.2 fL (ref 80.0–100.0)
Monocytes Absolute: 0.4 10*3/uL (ref 0.1–1.0)
Monocytes Relative: 11 %
Neutro Abs: 3.2 10*3/uL (ref 1.7–7.7)
Neutrophils Relative %: 82 %
Platelets: 257 10*3/uL (ref 150–400)
RBC: 2.94 MIL/uL — ABNORMAL LOW (ref 3.87–5.11)
RDW: 14.5 % (ref 11.5–15.5)
WBC: 3.9 10*3/uL — ABNORMAL LOW (ref 4.0–10.5)
nRBC: 0 % (ref 0.0–0.2)

## 2021-12-14 LAB — ABO/RH: ABO/RH(D): O NEG

## 2021-12-14 LAB — IRON AND IRON BINDING CAPACITY (CC-WL,HP ONLY)
Iron: 37 ug/dL (ref 28–170)
Saturation Ratios: 18 % (ref 10.4–31.8)
TIBC: 202 ug/dL — ABNORMAL LOW (ref 250–450)
UIBC: 165 ug/dL (ref 148–442)

## 2021-12-14 LAB — FERRITIN: Ferritin: 300 ng/mL (ref 11–307)

## 2021-12-14 NOTE — Assessment & Plan Note (Signed)
She has persistent pain and swelling on the right lower extremity I recommend elevation of her leg She will continue anticoagulation therapy for minimum 3 to 6 months I will see her in again in a month for further follow-up

## 2021-12-14 NOTE — Assessment & Plan Note (Signed)
She will continue radiation therapy I will defer to radiation oncologist for follow-up 

## 2021-12-14 NOTE — Progress Notes (Signed)
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   All questions were answered. The patient knows to call the clinic with any problems, questions or concerns. The total time spent in the appointment was 20 minutes encounter with patients including review of chart and various tests results, discussions about plan of care and coordination of care plan   Heath Lark, MD 12/14/2021 12:21 PM  INTERVAL HISTORY: Please see below for problem oriented charting. she returns for treatment follow-up She was recently diagnosed with right lower extremity DVT She went to the ER due to persistent pain and swelling She is compliant taking medications as directed No recent bleeding  REVIEW OF SYSTEMS:   Constitutional: Denies fevers, chills or abnormal weight loss Eyes: Denies blurriness of vision Ears, nose, mouth, throat, and face: Denies mucositis or sore throat Respiratory: Denies cough, dyspnea or wheezes Cardiovascular: Denies palpitation, chest discomfort  Gastrointestinal:  Denies nausea, heartburn or change in bowel habits Skin: Denies abnormal skin rashes Lymphatics: Denies new lymphadenopathy or easy bruising Neurological:Denies numbness, tingling or new weaknesses Behavioral/Psych: Mood is stable, no new changes  All other systems were  reviewed with the patient and are negative.  I have reviewed the past medical history, past surgical history, social history and family history with the patient and they are unchanged from previous note.  ALLERGIES:  has No Known Allergies.  MEDICATIONS:  Current Outpatient Medications  Medication Sig Dispense Refill   acetaminophen (TYLENOL) 325 MG tablet Take 650 mg by mouth as needed.     amLODipine (NORVASC) 5 MG tablet Take 10 mg by mouth daily.     APIXABAN (ELIQUIS) VTE STARTER PACK (10MG AND 5MG) Take as directed on package: start with two-24m tablets twice daily for 7 days. On day 8, switch to one-544mtablet twice daily. 74 tablet 0   aspirin EC 81 MG tablet Take 81 mg by mouth daily.     hydrochlorothiazide (HYDRODIURIL) 25 MG tablet TAKE 1 TABLET BY MOUTH IN THE MORNING (Patient taking differently: Take 25 mg by mouth daily.) 90 tablet 0   NEOMYCIN-POLYMYXIN-HYDROCORTISONE (CORTISPORIN) 1 % SOLN OTIC solution Apply 1-2 drops to toe BID after soaking (Patient not taking: Reported on 10/11/2021) 10 mL 1   ondansetron (ZOFRAN) 4 MG tablet Take 1 tablet (4 mg total) by mouth every 8 (eight) hours as needed for nausea or vomiting. 20 tablet 0   oxyCODONE (ROXICODONE) 5 MG immediate release tablet Take 1 tablet (5 mg total) by mouth every 4 (four) hours as needed for severe pain. 15 tablet 0   polyethylene glycol (MIRALAX / GLYCOLAX) 17 g packet Take 17 g by mouth daily.     potassium chloride SA (KLOR-CON M) 20 MEQ tablet Take 1 tablet (20 mEq total) by mouth 2 (two) times daily for 14 days. 28 tablet 0   No current facility-administered medications for  this visit.    SUMMARY OF ONCOLOGIC HISTORY: Oncology History Overview Note  PD-L1 CPS 50%   Vaginal cancer ([place])  09/16/2021 Initial Diagnosis   The patient initially presented on 3/24 with several weeks of postmenopausal bleeding.    09/17/2021 Imaging   Ct abdomen and pelvis New 6 cm ill-defined soft tissue mass centered in the  region of the cervix and upper vagina, with possible involvement of the rectum and/or bladder. This is suspicious for cervical carcinoma. Recommend GYN consultation, and consider pelvic MRI without and with contrast for further evaluation.   Several tiny calcified uterine fibroids.   No evidence of metastatic disease.   Stable small benign left adrenal adenoma.   09/27/2021 Pathology Results   A. VAGINAL SIDEWALL, BIOPSY:  -  Invasive squamous cell carcinoma, keratinizing  -  See comment   10/02/2021 Initial Diagnosis   Cervical cancer ([place])   10/08/2021 PET scan   1. Marked hypermetabolism associated with the cervical mass consistent with cervical cancer. 2. Small hypermetabolic right pelvic sidewall node is suspicious for metastatic adenopathy. 3. No omental or peritoneal surface lesions are identified.  4. No findings for metastatic disease involving the chest or bony structures. 5. Diffuse hypermetabolism in the thyroid gland likely reflecting thyroiditis. 6. Age related atherosclerotic disease.       10/14/2021 Cancer Staging   Staging form: Vagina, AJCC 8th Edition - Clinical stage from 10/14/2021: Stage III (cT2b, cN1, cM0) - Signed by Heath Lark, MD on 10/14/2021 Stage prefix: Initial diagnosis     PHYSICAL EXAMINATION: ECOG PERFORMANCE STATUS: 1 - Symptomatic but completely ambulatory  Vitals:   12/14/21 1200  BP: 130/63  Pulse: 76  Resp: 18  Temp: 97.6 F (36.4 C)  SpO2: 100%   Filed Weights   12/14/21 1200  Weight: 206 lb 3.2 oz (93.5 kg)    GENERAL:alert, no distress and comfortable SKIN: skin color, texture, turgor are normal, no rashes or significant lesions EYES: normal, Conjunctiva are pink and non-injected, sclera clear OROPHARYNX:no exudate, no erythema and lips, buccal mucosa, and tongue normal  NECK: supple, thyroid normal size, non-tender, without nodularity LYMPH:  no palpable lymphadenopathy in the cervical, axillary or inguinal LUNGS: clear to  auscultation and percussion with normal breathing effort HEART: regular rate & rhythm and no murmurs with right lower extremity edema ABDOMEN:abdomen soft, non-tender and normal bowel sounds Musculoskeletal:no cyanosis of digits and no clubbing  NEURO: alert & oriented x 3 with fluent speech, no focal motor/sensory deficits  LABORATORY DATA:  I have reviewed the data as listed    Component Value Date/Time   NA 138 12/11/2021 2150   NA 140 10/05/2020 0951   K 3.7 12/11/2021 2150   CL 98 12/11/2021 2150   CO2 31 12/11/2021 2143   GLUCOSE 109 (H) 12/11/2021 2150   BUN 13 12/11/2021 2150   BUN 20 10/05/2020 0951   CREATININE 0.70 12/11/2021 2150   CREATININE 0.78 12/08/2021 1100   CREATININE 1.06 (H) 07/09/2021 0000   CALCIUM 9.0 12/11/2021 2143   PROT 7.5 12/11/2021 2143   PROT 7.9 09/30/2019 0914   ALBUMIN 2.9 (L) 12/11/2021 2143   ALBUMIN 3.8 09/30/2019 0914   AST 33 12/11/2021 2143   AST 21 12/08/2021 1100   ALT 40 12/11/2021 2143   ALT 22 12/08/2021 1100   ALKPHOS 55 12/11/2021 2143   BILITOT 0.4 12/11/2021 2143   BILITOT 0.4 12/08/2021 1100   GFRNONAA >60 12/11/2021 2143   GFRNONAA >60 12/08/2021 1100   GFRAA  68 04/06/2020 0813    No results found for: "SPEP", "UPEP"  Lab Results  Component Value Date   WBC 3.9 (L) 12/14/2021   NEUTROABS 3.2 12/14/2021   HGB 9.0 (L) 12/14/2021   HCT 28.0 (L) 12/14/2021   MCV 95.2 12/14/2021   PLT 257 12/14/2021      Chemistry      Component Value Date/Time   NA 138 12/11/2021 2150   NA 140 10/05/2020 0951   K 3.7 12/11/2021 2150   CL 98 12/11/2021 2150   CO2 31 12/11/2021 2143   BUN 13 12/11/2021 2150   BUN 20 10/05/2020 0951   CREATININE 0.70 12/11/2021 2150   CREATININE 0.78 12/08/2021 1100   CREATININE 1.06 (H) 07/09/2021 0000      Component Value Date/Time   CALCIUM 9.0 12/11/2021 2143   ALKPHOS 55 12/11/2021 2143   AST 33 12/11/2021 2143   AST 21 12/08/2021 1100   ALT 40 12/11/2021 2143   ALT 22 12/08/2021  1100   BILITOT 0.4 12/11/2021 2143   BILITOT 0.4 12/08/2021 1100       RADIOGRAPHIC STUDIES: I have personally reviewed the radiological images as listed and agreed with the findings in the report. CT ABDOMEN PELVIS W CONTRAST  Result Date: 12/11/2021 CLINICAL DATA:  Uterine/cervical cancer, assess treatment response cervical cancer. has R leg DVT r/o may turner syndrome or recurrent cancer EXAM: CT ABDOMEN AND PELVIS WITH CONTRAST TECHNIQUE: Multidetector CT imaging of the abdomen and pelvis was performed using the standard protocol following bolus administration of intravenous contrast. RADIATION DOSE REDUCTION: This exam was performed according to the departmental dose-optimization program which includes automated exposure control, adjustment of the mA and/or kV according to patient size and/or use of iterative reconstruction technique. CONTRAST:  160m OMNIPAQUE IOHEXOL 300 MG/ML  SOLN COMPARISON:  PET-CT 10/08/2021, CT abdomen pelvis 09/17/2021, CT abdomen pelvis 06/28/2020 FINDINGS: Lower chest: Persistent trace pericardial effusion. Hepatobiliary: No focal liver abnormality. No gallstones, gallbladder wall thickening, or pericholecystic fluid. No biliary dilatation. Pancreas: No focal lesion. Normal pancreatic contour. No surrounding inflammatory changes. No main pancreatic ductal dilatation. Spleen: Normal in size without focal abnormality. Adrenals/Urinary Tract: Redemonstration of a stable in size 1.3 cm left adrenal gland nodule with a density of 55 Hounsfield units. No right adrenal gland nodule. Bilateral kidneys enhance symmetrically. No hydronephrosis. No hydroureter. There is a 1.3 cm hypodense lesion within the right kidney with a density of 26 Hounsfield units. The urinary bladder is unremarkable. On delayed imaging, there is no urothelial wall thickening and there are no filling defects in the opacified portions of the bilateral collecting systems or ureters. Stomach/Bowel: Stomach is  within normal limits. No evidence of bowel wall thickening or dilatation. Appendix appears normal. Vascular/Lymphatic: No abdominal aorta or iliac aneurysm. Severe atherosclerotic plaque of the aorta and its branches. No abdominal, pelvic, or inguinal lymphadenopathy. Reproductive: Persistent ill-defined at least 3.5 x 3 cm soft tissue mass along the left cervix/upper vagina. Loss of fat plane between the urinary bladder and this mass as well as the rectum and this mass. Coarsened calcifications within the uterus likely degenerative uterine fibroids. Otherwise uterus and bilateral adnexa are unremarkable. Other: No intraperitoneal free fluid. No intraperitoneal free gas. No organized fluid collection. Musculoskeletal: Ventral hernia repair with mesh. Recurrent tiny fat containing umbilical hernia. No abdominal wall hernia or abnormality. No suspicious lytic or blastic osseous lesions. No acute displaced fracture. Multilevel degenerative changes of the spine. Query S2 Tarlov cyst (6:99, 2:49). IMPRESSION: 1. Persistent  ill-defined soft tissue mass along the left cervix/upper vagina. Loss of fat plane between the urinary bladder and this mass as well as the rectum and this mass. Consider pelvic MRI with and without contrast for further evaluation. When the patient is clinically stable and able to follow directions and hold their breath (preferably as an outpatient) further evaluation with dedicated abdominal MRI should be considered. 2. Likely degenerative uterine fibroids. 3. Indeterminate 1.3 cm right renal lesion. This can further be evaluated on the MRI. 4. Stable in size indeterminate 1.3 cm left adrenal gland nodule with a density of 55 Hounsfield units. 5. Persistent trace pericardial effusion. 6.  Aortic Atherosclerosis (ICD10-I70.0). 7. Ventral hernia repair with mesh. Recurrent tiny fat containing umbilical hernia. Electronically Signed   By: Iven Finn M.D.   On: 12/11/2021 23:34   VAS [place] LOWER  EXTREMITY VENOUS (DVT)  Result Date: 12/07/2021  Lower Venous DVT Study Patient Name:  GLEN BLATCHLEY  Date of Exam:   12/07/2021 Medical Rec #: [id number]        Accession #:    [id number] Date of Birth: 1936-01-29        Patient Gender: F Patient Age:   41 years Exam Location:  [facility] Procedure:      VAS [place] LOWER EXTREMITY VENOUS (DVT) Referring Phys: Gery Pray --------------------------------------------------------------------------------  Indications: Pain, and Swelling.  Risk Factors: Cancer Cancer patient. Comparison Study: No previous exam noted. Performing Technologist: Bobetta Lime  Examination Guidelines: A complete evaluation includes B-mode imaging, spectral Doppler, color Doppler, and power Doppler as needed of all accessible portions of each vessel. Bilateral testing is considered an integral part of a complete examination. Limited examinations for reoccurring indications may be performed as noted. The reflux portion of the exam is performed with the patient in reverse Trendelenburg.  +---------+---------------+---------+-----------+----------+--------------+ RIGHT    CompressibilityPhasicitySpontaneityPropertiesThrombus Aging +---------+---------------+---------+-----------+----------+--------------+ CFV      Full           Yes      Yes                                 +---------+---------------+---------+-----------+----------+--------------+ SFJ      Full                                                        +---------+---------------+---------+-----------+----------+--------------+ FV Prox  Partial        Yes      Yes                  Acute          +---------+---------------+---------+-----------+----------+--------------+ FV Mid   None           No       No                   Acute          +---------+---------------+---------+-----------+----------+--------------+ FV DistalNone           No       No                   Acute           +---------+---------------+---------+-----------+----------+--------------+ PFV      Full  Acute          +---------+---------------+---------+-----------+----------+--------------+ POP      None           No       No                   Acute          +---------+---------------+---------+-----------+----------+--------------+ PTV      None           No       No                   Acute          +---------+---------------+---------+-----------+----------+--------------+ PERO     None           No       No                   Acute          +---------+---------------+---------+-----------+----------+--------------+ Gastroc  None           No       No                   Acute          +---------+---------------+---------+-----------+----------+--------------+   +----+---------------+---------+-----------+----------+--------------+ LEFTCompressibilityPhasicitySpontaneityPropertiesThrombus Aging +----+---------------+---------+-----------+----------+--------------+ CFV Full           Yes      Yes                                 +----+---------------+---------+-----------+----------+--------------+     Summary: RIGHT: - Findings consistent with acute deep vein thrombosis involving the right femoral vein, right popliteal vein, right posterior tibial veins, right peroneal veins, and right gastrocnemius veins.  LEFT: - No evidence of common femoral vein obstruction.  *See table(s) above for measurements and observations. Electronically signed by Deitra Mayo MD on 12/07/2021 at 3:15:02 PM.    Final

## 2021-12-14 NOTE — Assessment & Plan Note (Signed)
She has persistent anemia, stable She denies recent bleeding We will observe closely

## 2021-12-15 ENCOUNTER — Telehealth: Payer: Self-pay | Admitting: Hematology and Oncology

## 2021-12-15 ENCOUNTER — Other Ambulatory Visit: Payer: Self-pay

## 2021-12-15 ENCOUNTER — Inpatient Hospital Stay: Payer: Medicare HMO

## 2021-12-15 ENCOUNTER — Inpatient Hospital Stay: Payer: Medicare HMO | Admitting: Physician Assistant

## 2021-12-15 ENCOUNTER — Ambulatory Visit
Admission: RE | Admit: 2021-12-15 | Discharge: 2021-12-15 | Disposition: A | Discharge status: Home / Self Care | Payer: Medicare HMO | Source: Ambulatory Visit | Attending: Radiation Oncology | Admitting: Radiation Oncology

## 2021-12-15 DIAGNOSIS — C52 Malignant neoplasm of vagina: Secondary | ICD-10-CM | POA: Diagnosis not present

## 2021-12-15 LAB — RAD ONC ARIA SESSION SUMMARY
Course Elapsed Days: 28
Plan Fractions Treated to Date: 20
Plan Prescribed Dose Per Fraction: 1.8 Gy
Plan Total Fractions Prescribed: 25
Plan Total Prescribed Dose: 45 Gy
Reference Point Dosage Given to Date: 36 Gy
Reference Point Session Dosage Given: 1.8 Gy
Session Number: 20

## 2021-12-15 NOTE — Telephone Encounter (Signed)
Scheduled appointment per 06/20 los. Left message.

## 2021-12-16 ENCOUNTER — Other Ambulatory Visit: Payer: Self-pay

## 2021-12-16 ENCOUNTER — Ambulatory Visit
Admission: RE | Admit: 2021-12-16 | Discharge: 2021-12-16 | Disposition: A | Discharge status: Home / Self Care | Payer: Medicare HMO | Source: Ambulatory Visit | Attending: Radiation Oncology | Admitting: Radiation Oncology

## 2021-12-16 DIAGNOSIS — C52 Malignant neoplasm of vagina: Secondary | ICD-10-CM | POA: Diagnosis not present

## 2021-12-16 LAB — RAD ONC ARIA SESSION SUMMARY
Course Elapsed Days: 29
Plan Fractions Treated to Date: 21
Plan Prescribed Dose Per Fraction: 1.8 Gy
Plan Total Fractions Prescribed: 25
Plan Total Prescribed Dose: 45 Gy
Reference Point Dosage Given to Date: 37.8 Gy
Reference Point Session Dosage Given: 1.8 Gy
Session Number: 21

## 2021-12-17 ENCOUNTER — Other Ambulatory Visit: Payer: Self-pay

## 2021-12-17 ENCOUNTER — Ambulatory Visit
Admission: RE | Admit: 2021-12-17 | Discharge: 2021-12-17 | Disposition: A | Discharge status: Home / Self Care | Payer: Medicare HMO | Source: Ambulatory Visit | Attending: Radiation Oncology | Admitting: Radiation Oncology

## 2021-12-17 ENCOUNTER — Encounter: Payer: Medicare HMO | Admitting: Physician Assistant

## 2021-12-17 DIAGNOSIS — C52 Malignant neoplasm of vagina: Secondary | ICD-10-CM | POA: Diagnosis not present

## 2021-12-17 LAB — RAD ONC ARIA SESSION SUMMARY
Course Elapsed Days: 30
Plan Fractions Treated to Date: 22
Plan Prescribed Dose Per Fraction: 1.8 Gy
Plan Total Fractions Prescribed: 25
Plan Total Prescribed Dose: 45 Gy
Reference Point Dosage Given to Date: 39.6 Gy
Reference Point Session Dosage Given: 1.8 Gy
Session Number: 22

## 2021-12-20 ENCOUNTER — Other Ambulatory Visit: Payer: Self-pay

## 2021-12-20 ENCOUNTER — Ambulatory Visit
Admission: RE | Admit: 2021-12-20 | Discharge: 2021-12-20 | Disposition: A | Discharge status: Home / Self Care | Payer: Medicare HMO | Source: Ambulatory Visit | Attending: Radiation Oncology | Admitting: Radiation Oncology

## 2021-12-20 DIAGNOSIS — C52 Malignant neoplasm of vagina: Secondary | ICD-10-CM | POA: Diagnosis not present

## 2021-12-20 LAB — RAD ONC ARIA SESSION SUMMARY
Course Elapsed Days: 33
Plan Fractions Treated to Date: 23
Plan Prescribed Dose Per Fraction: 1.8 Gy
Plan Total Fractions Prescribed: 25
Plan Total Prescribed Dose: 45 Gy
Reference Point Dosage Given to Date: 41.4 Gy
Reference Point Session Dosage Given: 1.8 Gy
Session Number: 23

## 2021-12-21 ENCOUNTER — Ambulatory Visit
Admission: RE | Admit: 2021-12-21 | Discharge: 2021-12-21 | Disposition: A | Discharge status: Home / Self Care | Payer: Medicare HMO | Source: Ambulatory Visit | Attending: Radiation Oncology | Admitting: Radiation Oncology

## 2021-12-21 ENCOUNTER — Other Ambulatory Visit: Payer: Self-pay

## 2021-12-21 ENCOUNTER — Ambulatory Visit: Payer: Medicare HMO

## 2021-12-21 DIAGNOSIS — C52 Malignant neoplasm of vagina: Secondary | ICD-10-CM

## 2021-12-21 LAB — RAD ONC ARIA SESSION SUMMARY
Course Elapsed Days: 34
Plan Fractions Treated to Date: 24
Plan Prescribed Dose Per Fraction: 1.8 Gy
Plan Total Fractions Prescribed: 25
Plan Total Prescribed Dose: 45 Gy
Reference Point Dosage Given to Date: 43.2 Gy
Reference Point Session Dosage Given: 1.8 Gy
Session Number: 24

## 2021-12-21 MED ORDER — SILVER SULFADIAZINE 1 % EX CREA: TOPICAL_CREAM | Freq: Every day | CUTANEOUS | Status: DC

## 2021-12-22 ENCOUNTER — Other Ambulatory Visit: Payer: Self-pay

## 2021-12-22 ENCOUNTER — Ambulatory Visit
Admission: RE | Admit: 2021-12-22 | Discharge: 2021-12-22 | Disposition: A | Discharge status: Home / Self Care | Payer: Medicare HMO | Source: Ambulatory Visit | Attending: Radiation Oncology | Admitting: Radiation Oncology

## 2021-12-22 DIAGNOSIS — C52 Malignant neoplasm of vagina: Secondary | ICD-10-CM | POA: Diagnosis not present

## 2021-12-22 LAB — RAD ONC ARIA SESSION SUMMARY
Course Elapsed Days: 35
Plan Fractions Treated to Date: 25
Plan Prescribed Dose Per Fraction: 1.8 Gy
Plan Total Fractions Prescribed: 25
Plan Total Prescribed Dose: 45 Gy
Reference Point Dosage Given to Date: 45 Gy
Reference Point Session Dosage Given: 1.8 Gy
Session Number: 25

## 2021-12-23 ENCOUNTER — Other Ambulatory Visit: Payer: Self-pay

## 2021-12-23 ENCOUNTER — Ambulatory Visit
Admission: RE | Admit: 2021-12-23 | Discharge: 2021-12-23 | Disposition: A | Discharge status: Home / Self Care | Payer: Medicare HMO | Source: Ambulatory Visit | Attending: Radiation Oncology | Admitting: Radiation Oncology

## 2021-12-23 DIAGNOSIS — C52 Malignant neoplasm of vagina: Secondary | ICD-10-CM | POA: Diagnosis not present

## 2021-12-23 LAB — RAD ONC ARIA SESSION SUMMARY
Course Elapsed Days: 36
Plan Fractions Treated to Date: 1
Plan Prescribed Dose Per Fraction: 1.8 Gy
Plan Total Fractions Prescribed: 5
Plan Total Prescribed Dose: 9 Gy
Reference Point Dosage Given to Date: 46.8 Gy
Reference Point Session Dosage Given: 1.8 Gy
Session Number: 26

## 2021-12-24 ENCOUNTER — Ambulatory Visit
Admission: RE | Admit: 2021-12-24 | Discharge: 2021-12-24 | Disposition: A | Discharge status: Home / Self Care | Payer: Medicare HMO | Source: Ambulatory Visit | Attending: Radiation Oncology | Admitting: Radiation Oncology

## 2021-12-24 ENCOUNTER — Other Ambulatory Visit: Payer: Self-pay

## 2021-12-24 DIAGNOSIS — C52 Malignant neoplasm of vagina: Secondary | ICD-10-CM | POA: Diagnosis not present

## 2021-12-24 LAB — RAD ONC ARIA SESSION SUMMARY
Course Elapsed Days: 37
Plan Fractions Treated to Date: 2
Plan Prescribed Dose Per Fraction: 1.8 Gy
Plan Total Fractions Prescribed: 5
Plan Total Prescribed Dose: 9 Gy
Reference Point Dosage Given to Date: 48.6 Gy
Reference Point Session Dosage Given: 1.8 Gy
Session Number: 27

## 2021-12-27 ENCOUNTER — Other Ambulatory Visit: Payer: Self-pay

## 2021-12-27 ENCOUNTER — Ambulatory Visit: Payer: Medicare HMO

## 2021-12-27 ENCOUNTER — Ambulatory Visit
Admission: RE | Admit: 2021-12-27 | Discharge: 2021-12-27 | Disposition: A | Discharge status: Home / Self Care | Payer: Medicare HMO | Source: Ambulatory Visit | Attending: Radiation Oncology | Admitting: Radiation Oncology

## 2021-12-27 DIAGNOSIS — Z510 Encounter for antineoplastic radiation therapy: Secondary | ICD-10-CM | POA: Diagnosis not present

## 2021-12-27 DIAGNOSIS — C52 Malignant neoplasm of vagina: Secondary | ICD-10-CM | POA: Diagnosis present

## 2021-12-27 LAB — RAD ONC ARIA SESSION SUMMARY
Course Elapsed Days: 40
Plan Fractions Treated to Date: 3
Plan Prescribed Dose Per Fraction: 1.8 Gy
Plan Total Fractions Prescribed: 5
Plan Total Prescribed Dose: 9 Gy
Reference Point Dosage Given to Date: 50.4 Gy
Reference Point Session Dosage Given: 1.8 Gy
Session Number: 28

## 2021-12-29 ENCOUNTER — Ambulatory Visit
Admission: RE | Admit: 2021-12-29 | Discharge: 2021-12-29 | Disposition: A | Discharge status: Home / Self Care | Payer: Medicare HMO | Source: Ambulatory Visit | Attending: Radiation Oncology | Admitting: Radiation Oncology

## 2021-12-29 ENCOUNTER — Other Ambulatory Visit: Payer: Self-pay

## 2021-12-29 DIAGNOSIS — Z510 Encounter for antineoplastic radiation therapy: Secondary | ICD-10-CM | POA: Diagnosis not present

## 2021-12-29 LAB — RAD ONC ARIA SESSION SUMMARY
Course Elapsed Days: 42
Plan Fractions Treated to Date: 4
Plan Prescribed Dose Per Fraction: 1.8 Gy
Plan Total Fractions Prescribed: 5
Plan Total Prescribed Dose: 9 Gy
Reference Point Dosage Given to Date: 52.2 Gy
Reference Point Session Dosage Given: 1.8 Gy
Session Number: 29

## 2021-12-30 ENCOUNTER — Other Ambulatory Visit: Payer: Self-pay

## 2021-12-30 ENCOUNTER — Ambulatory Visit
Admission: RE | Admit: 2021-12-30 | Discharge: 2021-12-30 | Disposition: A | Discharge status: Home / Self Care | Payer: Medicare HMO | Source: Ambulatory Visit | Attending: Radiation Oncology | Admitting: Radiation Oncology

## 2021-12-30 DIAGNOSIS — Z510 Encounter for antineoplastic radiation therapy: Secondary | ICD-10-CM | POA: Diagnosis not present

## 2021-12-30 LAB — RAD ONC ARIA SESSION SUMMARY
Course Elapsed Days: 43
Plan Fractions Treated to Date: 5
Plan Prescribed Dose Per Fraction: 1.8 Gy
Plan Total Fractions Prescribed: 5
Plan Total Prescribed Dose: 9 Gy
Reference Point Dosage Given to Date: 54 Gy
Reference Point Session Dosage Given: 1.8 Gy
Session Number: 30

## 2022-01-05 ENCOUNTER — Telehealth: Payer: Self-pay | Admitting: *Deleted

## 2022-01-05 NOTE — Telephone Encounter (Signed)
CALLED PATIENT TO ASK IF SHE IS FEELING BETTER DUE TO HER HAVING A COLD, SPOKE WITH PATIENT AND SHE IS NOT FEELING BETTER, SHE OPTED TO CANCEL FOR 01-06-22 AND I WILL CALL HER ON MONDAY 01-10-22 TO SEE IF SHE IS FEELING BETTER, PATIENT VERIFIED UNDERSTANDING THIS

## 2022-01-06 ENCOUNTER — Other Ambulatory Visit: Payer: Self-pay | Admitting: *Deleted

## 2022-01-06 ENCOUNTER — Ambulatory Visit: Payer: Medicare HMO | Admitting: Radiation Oncology

## 2022-01-06 ENCOUNTER — Other Ambulatory Visit (HOSPITAL_COMMUNITY): Payer: Self-pay

## 2022-01-06 ENCOUNTER — Ambulatory Visit: Payer: Medicare HMO

## 2022-01-06 MED ORDER — APIXABAN 5 MG PO TABS
5.0000 mg | ORAL_TABLET | Freq: Two times a day (BID) | ORAL | 3 refills | Status: DC
  Filled 2022-01-06: qty 60, 30d supply, fill #0
  Filled 2022-02-05: qty 60, 30d supply, fill #1
  Filled 2022-03-07: qty 60, 30d supply, fill #2
  Filled 2022-04-08: qty 60, 30d supply, fill #3

## 2022-01-06 NOTE — Telephone Encounter (Signed)
Eliquis refilled and sent to [place]. Pt called and notified

## 2022-01-11 ENCOUNTER — Telehealth: Payer: Self-pay | Admitting: *Deleted

## 2022-01-11 NOTE — Telephone Encounter (Signed)
Called patient to inform of dates for New [facility], lvm for a return call

## 2022-01-11 NOTE — Telephone Encounter (Signed)
CALLED PATIENT TO REMIND OF NEW [facility], SPOKE WITH PATIENT AND SHE IS AWARE OF THESE APPTS 

## 2022-01-13 ENCOUNTER — Ambulatory Visit: Payer: Medicare HMO | Admitting: Radiation Oncology

## 2022-01-13 ENCOUNTER — Inpatient Hospital Stay: Payer: Medicare HMO | Admitting: Hematology and Oncology

## 2022-01-13 ENCOUNTER — Encounter: Payer: Self-pay | Admitting: Hematology and Oncology

## 2022-01-13 ENCOUNTER — Inpatient Hospital Stay: Payer: Medicare HMO | Attending: Gynecologic Oncology

## 2022-01-13 ENCOUNTER — Other Ambulatory Visit: Payer: Self-pay

## 2022-01-13 DIAGNOSIS — N939 Abnormal uterine and vaginal bleeding, unspecified: Secondary | ICD-10-CM

## 2022-01-13 DIAGNOSIS — Z7901 Long term (current) use of anticoagulants: Secondary | ICD-10-CM | POA: Diagnosis not present

## 2022-01-13 DIAGNOSIS — I82401 Acute embolism and thrombosis of unspecified deep veins of right lower extremity: Secondary | ICD-10-CM | POA: Insufficient documentation

## 2022-01-13 DIAGNOSIS — I824Z1 Acute embolism and thrombosis of unspecified deep veins of right distal lower extremity: Secondary | ICD-10-CM

## 2022-01-13 DIAGNOSIS — C52 Malignant neoplasm of vagina: Secondary | ICD-10-CM | POA: Insufficient documentation

## 2022-01-13 LAB — CBC WITH DIFFERENTIAL/PLATELET
Abs Immature Granulocytes: 0.03 10*3/uL (ref 0.00–0.07)
Basophils Absolute: 0 10*3/uL (ref 0.0–0.1)
Basophils Relative: 0 %
Eosinophils Absolute: 0 10*3/uL (ref 0.0–0.5)
Eosinophils Relative: 1 %
HCT: 27.8 % — ABNORMAL LOW (ref 36.0–46.0)
Hemoglobin: 8.9 g/dL — ABNORMAL LOW (ref 12.0–15.0)
Immature Granulocytes: 1 %
Lymphocytes Relative: 20 %
Lymphs Abs: 0.9 10*3/uL (ref 0.7–4.0)
MCH: 30.8 pg (ref 26.0–34.0)
MCHC: 32 g/dL (ref 30.0–36.0)
MCV: 96.2 fL (ref 80.0–100.0)
Monocytes Absolute: 0.6 10*3/uL (ref 0.1–1.0)
Monocytes Relative: 13 %
Neutro Abs: 2.9 10*3/uL (ref 1.7–7.7)
Neutrophils Relative %: 65 %
Platelets: 238 10*3/uL (ref 150–400)
RBC: 2.89 MIL/uL — ABNORMAL LOW (ref 3.87–5.11)
RDW: 15.9 % — ABNORMAL HIGH (ref 11.5–15.5)
WBC: 4.5 10*3/uL (ref 4.0–10.5)
nRBC: 0 % (ref 0.0–0.2)

## 2022-01-13 NOTE — Assessment & Plan Note (Signed)
She tolerated Eliquis without difficulties She denies recent bleeding She still have some mild pain on the right lower extremity which is not unusual I will reassess again in 3 months for further follow-up

## 2022-01-13 NOTE — Progress Notes (Signed)
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 The patient knows to call the clinic with any problems, questions or concerns. The total time spent in the appointment was 20 minutes encounter with patients including review of chart and various tests results, discussions about plan of care and coordination of care plan   Heath Lark, MD 01/13/2022 11:04 AM  INTERVAL HISTORY: Please see below for problem oriented charting. she returns for treatment follow-up She was started on Eliquis recently due to right lower extremity DVT Her pain is better She has persistent right lower extremity edema No bleeding complications from anticoagulation therapy  REVIEW OF SYSTEMS:   Constitutional: Denies fevers, chills or abnormal weight loss Eyes: Denies blurriness of vision Ears, nose, mouth, throat, and face: Denies mucositis or sore throat Respiratory: Denies cough, dyspnea or wheezes Cardiovascular: Denies palpitation, chest discomfort  Gastrointestinal:  Denies nausea, heartburn or change in bowel habits Skin: Denies abnormal skin rashes Lymphatics: Denies new lymphadenopathy or easy bruising Neurological:Denies numbness, tingling or new weaknesses Behavioral/Psych: Mood is stable, no new changes  All other systems were reviewed with the patient  and are negative.  I have reviewed the past medical history, past surgical history, social history and family history with the patient and they are unchanged from previous note.  ALLERGIES:  has No Known Allergies.  MEDICATIONS:  Current Outpatient Medications  Medication Sig Dispense Refill   acetaminophen (TYLENOL) 325 MG tablet Take 650 mg by mouth as needed.     amLODipine (NORVASC) 5 MG tablet Take 10 mg by mouth daily.     apixaban (ELIQUIS) 5 MG TABS tablet Take 1 tablet (5 mg total) by mouth 2 (two) times daily. 60 tablet 3   aspirin EC 81 MG tablet Take 81 mg by mouth daily.     hydrochlorothiazide (HYDRODIURIL) 25 MG tablet TAKE 1 TABLET BY MOUTH IN THE MORNING (Patient taking differently: Take 25 mg by mouth daily.) 90 tablet 0   NEOMYCIN-POLYMYXIN-HYDROCORTISONE (CORTISPORIN) 1 % SOLN OTIC solution Apply 1-2 drops to toe BID after soaking (Patient not taking: Reported on 10/11/2021) 10 mL 1   ondansetron (ZOFRAN) 4 MG tablet Take 1 tablet (4 mg total) by mouth every 8 (eight) hours as needed for nausea or vomiting. 20 tablet 0   oxyCODONE (ROXICODONE) 5 MG immediate release tablet Take 1 tablet (5 mg total) by mouth every 4 (four) hours as needed for severe pain. 15 tablet 0   polyethylene glycol (MIRALAX / GLYCOLAX) 17 g packet Take 17 g by mouth daily.     No current facility-administered medications for this visit.    SUMMARY OF ONCOLOGIC HISTORY: Oncology History Overview Note  PD-L1 CPS 50%   Vaginal cancer ([place])  09/16/2021 Initial Diagnosis   The patient initially presented on 3/24 with several weeks of postmenopausal bleeding.  09/17/2021 Imaging   Ct abdomen and pelvis New 6 cm ill-defined soft tissue mass centered in the region of the cervix and upper vagina, with possible involvement of the rectum and/or bladder. This is suspicious for cervical carcinoma. Recommend GYN consultation, and consider pelvic MRI without and with contrast for further evaluation.    Several tiny calcified uterine fibroids.   No evidence of metastatic disease.   Stable small benign left adrenal adenoma.   09/27/2021 Pathology Results   A. VAGINAL SIDEWALL, BIOPSY:  -  Invasive squamous cell carcinoma, keratinizing  -  See comment   10/02/2021 Initial Diagnosis   Cervical cancer ([place])   10/08/2021 PET scan   1. Marked hypermetabolism associated with the cervical mass consistent with cervical cancer. 2. Small hypermetabolic right pelvic sidewall node is suspicious for metastatic adenopathy. 3. No omental or peritoneal surface lesions are identified.  4. No findings for metastatic disease involving the chest or bony structures. 5. Diffuse hypermetabolism in the thyroid gland likely reflecting thyroiditis. 6. Age related atherosclerotic disease.       10/14/2021 Cancer Staging   Staging form: Vagina, AJCC 8th Edition - Clinical stage from 10/14/2021: Stage III (cT2b, cN1, cM0) - Signed by Heath Lark, MD on 10/14/2021 Stage prefix: Initial diagnosis     PHYSICAL EXAMINATION: ECOG PERFORMANCE STATUS: 1 - Symptomatic but completely ambulatory  Vitals:   01/13/22 1100  BP: (!) 144/58  Pulse: 76  Resp: 18  Temp: 98.6 F (37 C)  SpO2: 94%   Filed Weights   01/13/22 1100  Weight: 206 lb 6.4 oz (93.6 kg)    GENERAL:alert, no distress and comfortable HEART: She has persistent right lower extremity edema NEURO: alert & oriented x 3 with fluent speech, no focal motor/sensory deficits  LABORATORY DATA:  I have reviewed the data as listed    Component Value Date/Time   NA 138 12/11/2021 2150   NA 140 10/05/2020 0951   K 3.7 12/11/2021 2150   CL 98 12/11/2021 2150   CO2 31 12/11/2021 2143   GLUCOSE 109 (H) 12/11/2021 2150   BUN 13 12/11/2021 2150   BUN 20 10/05/2020 0951   CREATININE 0.70 12/11/2021 2150   CREATININE 0.78 12/08/2021 1100   CREATININE 1.06 (H) 07/09/2021 0000   CALCIUM 9.0 12/11/2021 2143   PROT 7.5 12/11/2021 2143   PROT 7.9  09/30/2019 0914   ALBUMIN 2.9 (L) 12/11/2021 2143   ALBUMIN 3.8 09/30/2019 0914   AST 33 12/11/2021 2143   AST 21 12/08/2021 1100   ALT 40 12/11/2021 2143   ALT 22 12/08/2021 1100   ALKPHOS 55 12/11/2021 2143   BILITOT 0.4 12/11/2021 2143   BILITOT 0.4 12/08/2021 1100   GFRNONAA >60 12/11/2021 2143   GFRNONAA >60 12/08/2021 1100   GFRAA 68 04/06/2020 0813    No results found for: "SPEP", "UPEP"  Lab Results  Component Value Date   WBC 4.5 01/13/2022   NEUTROABS 2.9 01/13/2022   HGB 8.9 (L) 01/13/2022   HCT 27.8 (L) 01/13/2022   MCV 96.2 01/13/2022   PLT 238 01/13/2022      Chemistry      Component Value Date/Time   NA 138 12/11/2021 2150   NA 140 10/05/2020 0951   K 3.7 12/11/2021 2150   CL 98 12/11/2021 2150   CO2 31 12/11/2021 2143   BUN 13 12/11/2021 2150   BUN 20 10/05/2020 0951   CREATININE 0.70 12/11/2021 2150   CREATININE 0.78 12/08/2021 1100   CREATININE 1.06 (H)  07/09/2021 0000      Component Value Date/Time   CALCIUM 9.0 12/11/2021 2143   ALKPHOS 55 12/11/2021 2143   AST 33 12/11/2021 2143   AST 21 12/08/2021 1100   ALT 40 12/11/2021 2143   ALT 22 12/08/2021 1100   BILITOT 0.4 12/11/2021 2143   BILITOT 0.4 12/08/2021 1100

## 2022-01-13 NOTE — Assessment & Plan Note (Signed)
She will continue radiation therapy I will defer to radiation oncologist for follow-up

## 2022-01-17 ENCOUNTER — Telehealth: Payer: Self-pay | Admitting: *Deleted

## 2022-01-17 NOTE — Progress Notes (Signed)
  Radiation Oncology         [phone] ________________________________  Name: Megan Ryan MRN: [id number]  Date: 01/18/2022  DOB: [birth year]  CC: [name], MD  [name], MD  HDR BRACHYTHERAPY NOTE  DIAGNOSIS: The encounter diagnosis was Vaginal cancer [facility]).   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 She will be treated with a 3.0 cm diameter segmented cylinder. This conforms to her anatomy without undue discomfort.  Vaginal brachytherapy procedure node: The patient was brought to the [place] suite. Identity was confirmed. All relevant records and images related to the planned course of therapy were reviewed. The patient freely provided informed written consent to proceed with treatment after reviewing the details related to the planned course of therapy. The consent form was witnessed and verified by the simulation staff. Then, the patient was set-up in a stable reproducible supine position for radiation therapy. Pelvic exam revealed the vaginal cuff to be intact . The patient's custom vaginal cylinder was placed in the proximal vagina. This was affixed to the CT/MR stabilization plate to prevent slippage. Patient tolerated the placement well.  Verification simulation note:  A fiducial marker was placed within the vaginal cylinder. An AP and lateral film was then obtained through the pelvis area. This documented accurate position of the vaginal cylinder for treatment.  HDR BRACHYTHERAPY TREATMENT  The remote afterloading device was affixed to the vaginal cylinder by catheter. Patient then proceeded to undergo her first high-dose-rate treatment directed at the proximal vagina. The patient  was prescribed a dose of 6.0 gray to be delivered to the mucosal surface. Treatment length was 4.0 cm. Patient was treated with 1 channel using 9 dwell positions. Treatment time was 190.0 seconds. Iridium 192 was the high-dose-rate source for treatment. The patient tolerated the treatment well. After completion of her therapy, a radiation survey was performed documenting return of the iridium source into the GammaMed safe.   PLAN: The patient will return next week for her second high-dose-rate treatment. ________________________________  -----------------------------------  Blair Promise, PhD, MD  This document serves as a record of services personally performed by Gery Pray, MD. It was created on his behalf by Roney Mans, a trained medical scribe. The creation of this record is based on the scribe's personal observations and the provider's statements to them. This document has been checked and approved by the attending provider.

## 2022-01-17 NOTE — Telephone Encounter (Signed)
CALLED PATIENT TO REMIND OF NEW HDR [place] FOR 01/18/22, SPOKE WITH PATIENT AND SHE IS AWARE OF THESE APPTS.

## 2022-01-17 NOTE — Progress Notes (Signed)
Radiation Oncology         [phone] ________________________________  Name: Megan Ryan MRN: [id number]  Date: 01/18/2022  DOB: 04-Jun-1936  Vaginal Brachytherapy Procedure Note  CC: Megan Ebbs, MD Megan Ebbs, MD  No diagnosis found.  Diagnosis: The encounter diagnosis was Vaginal cancer ([place]).   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 ***  Since her initial consultation date of 10/11/21, the patient has completed vaginal IMRT. The patient was diagnosed with DVT of the right LE while undergoing treatment and was started on Eliquis (DVT study showed extensive DVT on the right side from common femoral all the way down to the calf). Despite Eliquis, the patient presented to the ED on 12/11/21 with worsening right leg pain and swelling. CT of the abdomen and pelvis performed in the ED demonstrated the persistent ill-defined at least 3.5 x 3 cm soft tissue mass along the left cervix/upper vagina, noted with loss of fat plane between the urinary bladder and this mass, as well as the rectum and this mass. CT also showed an indeterminate 1.3 cm right renal lesion, and stable and indeterminate 1.3 cm left adrenal gland nodule, and persistent a trace pericardial effusion. Following work-up, the patient was discharged home with instructions to continue on eliquis.   Per her most recent follow up with Dr. Alvy Bimler on 01/13/22, the patient reported some ongoing mild pain and edema in the right lower extremity, but overall endorsed improvement in her pain and was noted to tolerate eliquis well.   ALLERGIES: has No Known Allergies.  Meds: Current Outpatient Medications  Medication Sig Dispense  Refill   acetaminophen (TYLENOL) 325 MG tablet Take 650 mg by mouth as needed.     amLODipine (NORVASC) 5 MG tablet Take 10 mg by mouth daily.     apixaban (ELIQUIS) 5 MG TABS tablet Take 1 tablet (5 mg total) by mouth 2 (two) times daily. 60 tablet 3   aspirin EC 81 MG tablet Take 81 mg by mouth daily.     hydrochlorothiazide (HYDRODIURIL) 25 MG tablet TAKE 1 TABLET BY MOUTH IN THE MORNING (Patient taking differently: Take 25 mg by mouth daily.) 90 tablet 0   NEOMYCIN-POLYMYXIN-HYDROCORTISONE (CORTISPORIN) 1 % SOLN OTIC solution Apply 1-2 drops to toe BID after soaking (Patient not taking: Reported on 10/11/2021) 10 mL 1   ondansetron (ZOFRAN) 4 MG tablet Take 1 tablet (4 mg total) by mouth every 8 (eight) hours as needed for nausea or vomiting. 20 tablet 0   oxyCODONE (ROXICODONE) 5 MG immediate release tablet Take 1 tablet (5 mg total) by mouth every 4 (four) hours as needed for severe pain. 15 tablet 0   polyethylene glycol (MIRALAX / GLYCOLAX) 17 g packet Take 17 g by mouth daily.     No current facility-administered medications for this encounter.    Physical Findings: The patient is in no acute distress. Patient is alert and oriented.  vitals were not taken for this visit.   No palpable cervical, supraclavicular or axillary lymphoadenopathy. The heart has a regular rate and rhythm. The lungs are clear to auscultation. Abdomen soft and non-tender.  On pelvic examination the external genitalia were unremarkable. A speculum exam was performed. Vaginal cuff intact,  no mucosal lesions. On bimanual exam there were no pelvic masses appreciated.  Lab Findings: Lab Results  Component Value Date   WBC 4.5 01/13/2022   HGB 8.9 (L) 01/13/2022   HCT 27.8 (L) 01/13/2022   MCV 96.2 01/13/2022   PLT 238 01/13/2022    Radiographic Findings: No results found.  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 The patient will  be treated with a *** cm diameter cylinder with a treatment length of *** cm. This distended the vaginal vault without undue discomfort. The patient tolerated the procedure well.  The patient was successfully fitted for a vaginal cylinder. The patient is appropriate to begin vaginal brachytherapy.   Plan: The patient will proceed with CT simulation and vaginal brachytherapy today.    _______________________________   Blair Promise, PhD, MD  This document serves as a record of services personally performed by Gery Pray, MD. It was created on his behalf by Roney Mans, a trained medical scribe. The creation of this record is based on the scribe's personal observations and the provider's statements to them. This document has been checked and approved by the attending provider.

## 2022-01-18 ENCOUNTER — Ambulatory Visit: Payer: Medicare HMO | Admitting: Radiation Oncology

## 2022-01-18 ENCOUNTER — Ambulatory Visit
Admission: RE | Admit: 2022-01-18 | Discharge: 2022-01-18 | Disposition: A | Discharge status: Home / Self Care | Payer: Medicare HMO | Source: Ambulatory Visit | Attending: Radiation Oncology | Admitting: Radiation Oncology

## 2022-01-18 ENCOUNTER — Other Ambulatory Visit: Payer: Self-pay

## 2022-01-18 ENCOUNTER — Encounter: Payer: Self-pay | Admitting: Radiation Oncology

## 2022-01-18 DIAGNOSIS — Z510 Encounter for antineoplastic radiation therapy: Secondary | ICD-10-CM | POA: Diagnosis not present

## 2022-01-18 DIAGNOSIS — C52 Malignant neoplasm of vagina: Secondary | ICD-10-CM

## 2022-01-18 LAB — RAD ONC ARIA SESSION SUMMARY
Course Elapsed Days: 62
Plan Fractions Treated to Date: 1
Plan Prescribed Dose Per Fraction: 6 Gy
Plan Total Fractions Prescribed: 4
Plan Total Prescribed Dose: 24 Gy
Reference Point Dosage Given to Date: 6 Gy
Reference Point Session Dosage Given: 6 Gy
Session Number: 31

## 2022-01-18 NOTE — Progress Notes (Signed)
Megan Ryan is here today for new Vaginal Cylinder fitting    Does the patient complain of any of the following:  Pain:No  Abdominal bloating: Yes Diarrhea/Constipation:Constipation  Nausea/Vomiting: No Vaginal Discharge: Yes, green discharge Blood in Urine or Stool: No Urinary Issues (dysuria/incomplete emptying/ incontinence/ increased frequency/urgency): No    Additional comments if applicable:  Patient continues to have some edema to right lower extremity. Continues to take Eliqus as directed.    BP (!) 141/66 (BP Location: Left Arm, Patient Position: Sitting)   Pulse 77   Temp (!) 97.4 F (36.3 C) (Temporal)   Resp 18   Ht '5\' 7"'$  (1.702 m)   Wt 205 lb 2 oz (93 kg)   SpO2 98%   BMI 32.13 kg/m

## 2022-01-20 ENCOUNTER — Ambulatory Visit: Payer: Medicare HMO | Admitting: Radiation Oncology

## 2022-01-20 ENCOUNTER — Ambulatory Visit: Payer: Self-pay | Admitting: Radiation Oncology

## 2022-01-26 ENCOUNTER — Telehealth: Payer: Self-pay | Admitting: *Deleted

## 2022-01-26 NOTE — Progress Notes (Signed)
  Radiation Oncology         [phone] ________________________________  Name: Megan Ryan MRN: [id number]  Date: 01/27/2022  DOB: 1935-12-22  CC: [name], MD  [name], MD  HDR BRACHYTHERAPY NOTE  DIAGNOSIS: The encounter diagnosis was Vaginal cancer [facility]).   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 She will be treated with a 3.0 cm diameter segmented cylinder. This conforms to her anatomy without undue discomfort.  Vaginal brachytherapy procedure node: The patient was brought to the [place] suite. Identity was confirmed. All relevant records and images related to the planned course of therapy were reviewed. The patient freely provided informed written consent to proceed with treatment after reviewing the details related to the planned course of therapy. The consent form was witnessed and verified by the simulation staff. Then, the patient was set-up in a stable reproducible supine position for radiation therapy. Pelvic exam revealed the vaginal cuff to be intact . The patient's custom vaginal cylinder was placed in the proximal vagina. This was affixed to the CT/MR stabilization plate to prevent slippage. Patient tolerated the placement well.  Verification simulation note:  A fiducial marker was placed within the vaginal cylinder. An AP and lateral film was then obtained through the pelvis area. This documented accurate position of the vaginal cylinder for treatment.  HDR BRACHYTHERAPY TREATMENT  The remote afterloading device was affixed to the vaginal cylinder by catheter. Patient then proceeded to undergo her second high-dose-rate treatment directed at the proximal vagina. The patient  was prescribed a dose of 6.0 gray to be delivered to the mucosal surface. Treatment length was 4.0 cm. Patient was treated with 1 channel using 9 dwell positions. Treatment time was 206.7 seconds. Iridium 192 was the high-dose-rate source for treatment. The patient tolerated the treatment well. After completion of her therapy, a radiation survey was performed documenting return of the iridium source into the GammaMed safe.   PLAN: The patient will return next week for her third high-dose-rate treatment. ________________________________  -----------------------------------  Blair Promise, PhD, MD  This document serves as a record of services personally performed by Gery Pray, MD. It was created on his behalf by Roney Mans, a trained medical scribe. The creation of this record is based on the scribe's personal observations and the provider's statements to them. This document has been checked and approved by the attending provider.

## 2022-01-26 NOTE — Telephone Encounter (Signed)
Called patient to remind of HDR Tx. for 01-27-22 @ 2 pm, spoke with patient and she is aware of this tx.

## 2022-01-27 ENCOUNTER — Other Ambulatory Visit: Payer: Self-pay

## 2022-01-27 ENCOUNTER — Ambulatory Visit
Admission: RE | Admit: 2022-01-27 | Discharge: 2022-01-27 | Disposition: A | Discharge status: Home / Self Care | Payer: Medicare HMO | Source: Ambulatory Visit | Attending: Radiation Oncology | Admitting: Radiation Oncology

## 2022-01-27 DIAGNOSIS — C52 Malignant neoplasm of vagina: Secondary | ICD-10-CM | POA: Insufficient documentation

## 2022-01-27 LAB — RAD ONC ARIA SESSION SUMMARY
Course Elapsed Days: 71
Plan Fractions Treated to Date: 2
Plan Prescribed Dose Per Fraction: 6 Gy
Plan Total Fractions Prescribed: 4
Plan Total Prescribed Dose: 24 Gy
Reference Point Dosage Given to Date: 12 Gy
Reference Point Session Dosage Given: 6 Gy
Session Number: 32

## 2022-02-01 ENCOUNTER — Telehealth: Payer: Self-pay | Admitting: *Deleted

## 2022-02-01 NOTE — Telephone Encounter (Signed)
Called patient to remind of HDR Tx. for 02-02-22 @ 10 am, spoke with patient and she is aware of this tx.

## 2022-02-01 NOTE — Progress Notes (Signed)
  Radiation Oncology         [phone] ________________________________  Name: Megan Ryan MRN: [id number]  Date: 02/02/2022  DOB: 06/21/1936  CC: [name], MD  [name], MD  HDR BRACHYTHERAPY NOTE  DIAGNOSIS: The encounter diagnosis was Vaginal cancer [facility]).   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 She will be treated with a 3.0 cm diameter segmented cylinder. This conforms to her anatomy without undue discomfort.  Vaginal brachytherapy procedure node: The patient was brought to the [place] suite. Identity was confirmed. All relevant records and images related to the planned course of therapy were reviewed. The patient freely provided informed written consent to proceed with treatment after reviewing the details related to the planned course of therapy. The consent form was witnessed and verified by the simulation staff. Then, the patient was set-up in a stable reproducible supine position for radiation therapy. Pelvic exam revealed the vaginal cuff to be intact . The patient's custom vaginal cylinder was placed in the proximal vagina. This was affixed to the CT/MR stabilization plate to prevent slippage. Patient tolerated the placement well.  Verification simulation note:  A fiducial marker was placed within the vaginal cylinder. An AP and lateral film was then obtained through the pelvis area. This documented accurate position of the vaginal cylinder for treatment.  HDR BRACHYTHERAPY TREATMENT  The remote afterloading device was affixed to the vaginal cylinder by catheter. Patient then proceeded to undergo her third high-dose-rate treatment directed at the proximal vagina. The patient  was prescribed a dose of 6.0 gray to be delivered to the mucosal surface. Treatment length was 4.0 cm. Patient was treated with 1 channel using 9 dwell positions. Treatment time was 218.7 seconds. Iridium 192 was the high-dose-rate source for treatment. The patient tolerated the treatment well. After completion of her therapy, a radiation survey was performed documenting return of the iridium source into the GammaMed safe.   PLAN: The patient will return next week for her fourth high-dose-rate treatment. ________________________________  -----------------------------------  Blair Promise, PhD, MD  This document serves as a record of services personally performed by Gery Pray, MD. It was created on his behalf by Roney Mans, a trained medical scribe. The creation of this record is based on the scribe's personal observations and the provider's statements to them. This document has been checked and approved by the attending provider.

## 2022-02-02 ENCOUNTER — Other Ambulatory Visit: Payer: Self-pay

## 2022-02-02 ENCOUNTER — Ambulatory Visit
Admission: RE | Admit: 2022-02-02 | Discharge: 2022-02-02 | Disposition: A | Discharge status: Home / Self Care | Payer: Medicare HMO | Source: Ambulatory Visit | Attending: Radiation Oncology | Admitting: Radiation Oncology

## 2022-02-02 DIAGNOSIS — C52 Malignant neoplasm of vagina: Secondary | ICD-10-CM

## 2022-02-02 LAB — RAD ONC ARIA SESSION SUMMARY
Course Elapsed Days: 77
Plan Fractions Treated to Date: 3
Plan Prescribed Dose Per Fraction: 6 Gy
Plan Total Fractions Prescribed: 4
Plan Total Prescribed Dose: 24 Gy
Reference Point Dosage Given to Date: 18 Gy
Reference Point Session Dosage Given: 6 Gy
Session Number: 33

## 2022-02-05 ENCOUNTER — Other Ambulatory Visit (HOSPITAL_COMMUNITY): Payer: Self-pay

## 2022-02-08 ENCOUNTER — Telehealth: Payer: Self-pay | Admitting: *Deleted

## 2022-02-08 NOTE — Progress Notes (Signed)
  Radiation Oncology         [phone] ________________________________  Name: Megan ZARAGOZA MRN: [id number]  Date: 02/09/2022  DOB: 1935/07/26  CC: [name], MD  [name], MD  HDR BRACHYTHERAPY NOTE  DIAGNOSIS: The encounter diagnosis was Vaginal cancer [facility]).   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 She will be treated with a 3.0 cm diameter segmented cylinder. This conforms to her anatomy without undue discomfort.  Vaginal brachytherapy procedure node: The patient was brought to the [place] suite. Identity was confirmed. All relevant records and images related to the planned course of therapy were reviewed. The patient freely provided informed written consent to proceed with treatment after reviewing the details related to the planned course of therapy. The consent form was witnessed and verified by the simulation staff. Then, the patient was set-up in a stable reproducible supine position for radiation therapy. Pelvic exam revealed the vaginal cuff to be intact . The patient's custom vaginal cylinder was placed in the proximal vagina. This was affixed to the CT/MR stabilization plate to prevent slippage. Patient tolerated the placement well.  Verification simulation note:  A fiducial marker was placed within the vaginal cylinder. An AP and lateral film was then obtained through the pelvis area. This documented accurate position of the vaginal cylinder for treatment.  HDR BRACHYTHERAPY TREATMENT  The remote afterloading device was affixed to the vaginal cylinder by catheter. Patient then proceeded to undergo her fourth high-dose-rate treatment directed at the proximal vagina. The patient  was prescribed a dose of 6.0 gray to be delivered to the mucosal surface. Treatment length was 4.0 cm. Patient was treated with 1 channel using 9 dwell positions. Treatment time was 233.5 seconds. Iridium 192 was the high-dose-rate source for treatment. The patient tolerated the treatment well. After completion of her therapy, a radiation survey was performed documenting return of the iridium source into the GammaMed safe.   PLAN: She has completed her definitive course of radiation therapy including external beam and brachytherapy treatments.  Routine follow-up in 1 month. ________________________________  -----------------------------------  Blair Promise, PhD, MD  This document serves as a record of services personally performed by Gery Pray, MD. It was created on his behalf by Roney Mans, a trained medical scribe. The creation of this record is based on the scribe's personal observations and the provider's statements to them. This document has been checked and approved by the attending provider.

## 2022-02-08 NOTE — Telephone Encounter (Signed)
Called patient to remind of HDR Tx. for 02-09-22 @ 2 pm, spoke with patient and she is aware of this tx.

## 2022-02-09 ENCOUNTER — Other Ambulatory Visit: Payer: Self-pay

## 2022-02-09 ENCOUNTER — Ambulatory Visit
Admission: RE | Admit: 2022-02-09 | Discharge: 2022-02-09 | Disposition: A | Discharge status: Home / Self Care | Payer: Medicare HMO | Source: Ambulatory Visit | Attending: Radiation Oncology | Admitting: Radiation Oncology

## 2022-02-09 ENCOUNTER — Encounter: Payer: Self-pay | Admitting: Radiation Oncology

## 2022-02-09 DIAGNOSIS — C52 Malignant neoplasm of vagina: Secondary | ICD-10-CM

## 2022-02-09 LAB — RAD ONC ARIA SESSION SUMMARY
Course Elapsed Days: 84
Plan Fractions Treated to Date: 4
Plan Prescribed Dose Per Fraction: 6 Gy
Plan Total Fractions Prescribed: 4
Plan Total Prescribed Dose: 24 Gy
Reference Point Dosage Given to Date: 24 Gy
Reference Point Session Dosage Given: 6 Gy
Session Number: 34

## 2022-02-16 ENCOUNTER — Ambulatory Visit: Payer: Medicare HMO | Admitting: Radiation Oncology

## 2022-03-07 ENCOUNTER — Other Ambulatory Visit (HOSPITAL_COMMUNITY): Payer: Self-pay

## 2022-03-07 ENCOUNTER — Encounter: Payer: Self-pay | Admitting: Radiation Oncology

## 2022-03-09 NOTE — Progress Notes (Incomplete)
  Radiation Oncology         [phone] ________________________________  Patient Name: Megan Ryan MRN: [id number] DOB: January 24, 1936 Referring Physician: [name] (Profile Not Attached) Date of Service: 02/09/2022 [facility] [place]                                                        End Of Treatment Note  Diagnoses: C52-Malignant neoplasm of vagina  Cancer Staging: The encounter diagnosis was Vaginal cancer ([place]).   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 During her final weekly treatment check for pelvic IMRT on 12/27/21, the patient reported fatigue, improvement to skin irritation in her right groin with use of silvadene, and persistent green vaginal discharge. The patient also tolerated brachytherapy relatively well without any significant side effects.   Plan: The patient will follow-up with radiation oncology in one month .  ________________________________________________ -----------------------------------  Blair Promise, PhD, MD  This document serves as a record of services personally performed by Gery Pray, MD. It was created on his behalf by Roney Mans, a trained medical scribe. The creation of this record is based on the scribe's personal observations and the provider's statements to them. This document has been checked and approved by the attending provider.

## 2022-03-09 NOTE — Progress Notes (Signed)
Radiation Oncology         [phone] ________________________________  Name: Megan Ryan MRN: [id number]  Date: 03/10/2022  DOB: [birth year]  Follow-Up Visit Note  CC: Megan Ebbs, MD  Megan Ebbs, MD    ICD-10-CM   1. Vaginal cancer ([place])  C52       Diagnosis: The encounter diagnosis was Vaginal cancer ([place]).   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, keratinizing    Cancer Staging  Vaginal cancer [facility]) Staging form: Vagina, AJCC 8th Edition - Clinical stage from 10/14/2021: Stage III (cT2b, cN1, cM0) - Signed by Megan Lark, MD on 10/14/2021  Interval Since Last Radiation: 29 days   Intent: Curative  Radiation Treatment Dates: 11/17/2021 through 02/09/2022 Site Technique Total Dose (Gy) Dose per Fx (Gy) Completed Fx Beam Energies  Pelvis: Pelvis IMRT 45/45 1.8 25/25 6X  Pelvis: Pelvis_Bst 3D 9/9 1.8 5/5 10X, 15X  Vagina: Pelvis_Bst_HDR HDR-brachy 24/24 6 4/4 Ir-192    Narrative:  The patient returns today for routine follow-up. The patient tolerated radiation therapy relatively well. During her final weekly treatment check for pelvic IMRT on 12/27/21, the patient reported fatigue, improvement to skin irritation in her right groin area with use of silvadene, and persistent green vaginal discharge. The patient also tolerated brachytherapy relatively well without any significant side effects.              Since her initial consultation date of 10/11/21, the patient was diagnosed with DVT of the right LE while undergoing treatment and was started on Eliquis (DVT study showed extensive DVT on the right side from common femoral all the way down to the calf). Despite Eliquis, the patient presented to the ED on 12/11/21 with worsening right leg pain and swelling. CT of the abdomen and pelvis performed in the ED demonstrated the persistent ill-defined at least 3.5 x 3 cm soft tissue mass along the left cervix/upper vagina, noted with loss of fat plane between the  urinary bladder and this mass, as well as the rectum and this mass. CT also showed an indeterminate 1.3 cm right renal lesion, and stable and indeterminate 1.3 cm left adrenal gland nodule, and persistent a trace pericardial effusion. Following work-up, the patient was discharged home with instructions to continue on eliquis.    Per her most recent follow up with Dr. Alvy Ryan on 01/13/22, the patient reported some ongoing mild pain and edema in the right lower extremity, but overall endorsed improvement in her pain and was noted to tolerate eliquis well.    On evaluation today the patient denies any further problems with vaginal drainage.  She denies any vaginal bleeding.  She reports urinary urgency and frequency which she had prior to starting her radiation therapy.  She denies any dysuria or hematuria.  She denies any pain within the pelvis area.  She denies any rectal bleeding.                   Allergies:  has No Known Allergies.  Meds: Current Outpatient Medications  Medication Sig Dispense Refill   acetaminophen (TYLENOL) 325 MG tablet Take 650 mg by mouth as needed.     amLODipine (NORVASC) 5 MG tablet Take 10 mg by mouth daily.     apixaban (ELIQUIS) 5 MG TABS tablet Take 1 tablet (5 mg total) by mouth 2 (two) times daily. 60 tablet 3   aspirin EC 81 MG tablet Take 81 mg by mouth daily.     hydrochlorothiazide (HYDRODIURIL) 25  MG tablet TAKE 1 TABLET BY MOUTH IN THE MORNING (Patient taking differently: Take 25 mg by mouth daily.) 90 tablet 0   polyethylene glycol (MIRALAX / GLYCOLAX) 17 g packet Take 17 g by mouth daily.     NEOMYCIN-POLYMYXIN-HYDROCORTISONE (CORTISPORIN) 1 % SOLN OTIC solution Apply 1-2 drops to toe BID after soaking (Patient not taking: Reported on 10/11/2021) 10 mL 1   ondansetron (ZOFRAN) 4 MG tablet Take 1 tablet (4 mg total) by mouth every 8 (eight) hours as needed for nausea or vomiting. (Patient not taking: Reported on 03/10/2022) 20 tablet 0   oxyCODONE (ROXICODONE)  5 MG immediate release tablet Take 1 tablet (5 mg total) by mouth every 4 (four) hours as needed for severe pain. (Patient not taking: Reported on 03/10/2022) 15 tablet 0   No current facility-administered medications for this encounter.    Physical Findings: The patient is in no acute distress. Patient is alert and oriented.  height is '5\' 7"'$  (1.702 m) and weight is 203 lb 6.4 oz (92.3 kg). Her temperature is 97.5 F (36.4 C) (abnormal). Her blood pressure is 149/72 (abnormal) and her pulse is 71. Her respiration is 20 and oxygen saturation is 100%. .  No significant changes. Lungs are clear to auscultation bilaterally. Heart has regular rate and rhythm. No palpable cervical, supraclavicular, or axillary adenopathy. Abdomen soft, non-tender, normal bowel sounds. Pelvic exam not performed in light of recent completion of treatment  Lab Findings: Lab Results  Component Value Date   WBC 4.5 01/13/2022   HGB 8.9 (L) 01/13/2022   HCT 27.8 (L) 01/13/2022   MCV 96.2 01/13/2022   PLT 238 01/13/2022    Radiographic Findings: No results found.  Impression: The encounter diagnosis was Vaginal cancer ([place]).   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  Plan: Today the patient was given a vaginal dilator and instructions on its use.  She will follow-up with Dr. Alvy Ryan on October 20.  We will also see if Dr. Berline Ryan can see her and examine her the same day.  Routine follow-up in radiation oncology in January 2024.    ____________________________________  Megan Promise, PhD, MD  This document serves as a record of services personally performed by Megan Pray, MD. It was created on his behalf by Megan Ryan, a trained medical scribe. The creation of this record is based on the scribe's personal observations and the provider's statements to them. This document has been checked and approved by the attending  provider.

## 2022-03-10 ENCOUNTER — Ambulatory Visit
Admission: RE | Admit: 2022-03-10 | Discharge: 2022-03-10 | Disposition: A | Discharge status: Home / Self Care | Payer: Medicare HMO | Source: Ambulatory Visit | Attending: Radiation Oncology | Admitting: Radiation Oncology

## 2022-03-10 ENCOUNTER — Telehealth: Payer: Self-pay | Admitting: *Deleted

## 2022-03-10 ENCOUNTER — Other Ambulatory Visit: Payer: Self-pay

## 2022-03-10 ENCOUNTER — Encounter: Payer: Self-pay | Admitting: Radiation Oncology

## 2022-03-10 DIAGNOSIS — C52 Malignant neoplasm of vagina: Secondary | ICD-10-CM | POA: Insufficient documentation

## 2022-03-10 HISTORY — DX: Personal history of irradiation: Z92.3

## 2022-03-10 NOTE — Telephone Encounter (Signed)
Called and left the patient a message to call the office back. Patient needs to be given the appt with Dr Berline Lopes for 10/20

## 2022-03-10 NOTE — Progress Notes (Signed)
Megan Ryan is here today for follow up post radiation to the pelvic.  They completed their radiation on: 02/09/22   Does the patient complain of any of the following:  Pain:No Abdominal bloating: No Diarrhea/Constipation: Constipation, patient takes miralax daily.  Nausea/Vomiting: No Vaginal Discharge: No Blood in Urine or Stool: No Urinary Issues (dysuria/incomplete emptying/ incontinence/ increased frequency/urgency): Urinary frequency, and urgency.  Does patient report using vaginal dilator 2-3 times a week and/or sexually active 2-3 weeks: Patient provided with S and S+ vaginal dilators with instructions and importance of use. Patient voiced understanding.  Post radiation skin changes: No   Additional comments if applicable:    BP (!) 768/08 (BP Location: Left Arm, Patient Position: Sitting, Cuff Size: Large)   Pulse 71   Temp (!) 97.5 F (36.4 C)   Resp 20   Ht '5\' 7"'$  (1.702 m)   Wt 203 lb 6.4 oz (92.3 kg)   SpO2 100%   BMI 31.86 kg/m

## 2022-03-10 NOTE — Telephone Encounter (Signed)
CALLED PATIENT TO INFORM OF FU APPT. WITH DR. KINARD ON 07-18-22 @ 11 AM, LVM FOR A RETURN CALL

## 2022-04-08 ENCOUNTER — Other Ambulatory Visit (HOSPITAL_COMMUNITY): Payer: Self-pay

## 2022-04-13 ENCOUNTER — Encounter: Payer: Self-pay | Admitting: Gynecologic Oncology

## 2022-04-13 NOTE — Progress Notes (Signed)
Gynecologic Oncology Return Clinic Visit  04/15/22  Reason for Visit: Surveillance visit in the setting of vaginal cancer  Treatment History: Oncology History Overview Note  PD-L1 CPS 50%   Vaginal cancer ([place])  09/16/2021 Initial Diagnosis   The patient initially presented on 3/24 with several weeks of postmenopausal bleeding.    09/17/2021 Imaging   Ct abdomen and pelvis New 6 cm ill-defined soft tissue mass centered in the region of the cervix and upper vagina, with possible involvement of the rectum and/or bladder. This is suspicious for cervical carcinoma. Recommend GYN consultation, and consider pelvic MRI without and with contrast for further evaluation.   Several tiny calcified uterine fibroids.   No evidence of metastatic disease.   Stable small benign left adrenal adenoma.   09/27/2021 Pathology Results   A. VAGINAL SIDEWALL, BIOPSY:  -  Invasive squamous cell carcinoma, keratinizing  -  See comment   10/02/2021 Initial Diagnosis   Cervical cancer ([place])   10/08/2021 PET scan   1. Marked hypermetabolism associated with the cervical mass consistent with cervical cancer. 2. Small hypermetabolic right pelvic sidewall node is suspicious for metastatic adenopathy. 3. No omental or peritoneal surface lesions are identified.  4. No findings for metastatic disease involving the chest or bony structures. 5. Diffuse hypermetabolism in the thyroid gland likely reflecting thyroiditis. 6. Age related atherosclerotic disease.       10/14/2021 Cancer Staging   Staging form: Vagina, AJCC 8th Edition - Clinical stage from 10/14/2021: Stage III (cT2b, cN1, cM0) - Signed by Heath Lark, MD on 10/14/2021 Stage prefix: Initial diagnosis   11/17/2021 - 02/09/2022 Radiation Therapy   Site Technique Total Dose (Gy) Dose per Fx (Gy) Completed Fx Beam Energies  Pelvis: Pelvis IMRT 45/45 1.8 25/25 6X  Pelvis: Pelvis_Bst 3D 9/9 1.8 5/5 10X, 15X  Vagina: Pelvis_Bst_HDR HDR-brachy 24/24 6 4/4  Ir-192       Interval History: She completed radiation in August.  Overall reports she is doing well.  Appetite is improving.  She denies any vaginal bleeding or discharge.  She denies any pelvic pain.  She denies any diarrhea.  She has intermittent constipation, which is at baseline for her.  She continues to have urinary frequency, especially at night, unchanged since radiation.  Past Medical/Surgical History: Past Medical History:  Diagnosis Date   Arthritis    knee and back pain   Cervical cancer ([place]) 10/02/2021   Diabetes mellitus ([place]) 06/27/2016   History of radiation therapy    Vagina- 11/17/21-02/09/22-Dr. Gery Pray   Hypertension     Past Surgical History:  Procedure Laterality Date   HERNIA REPAIR  2003   repair with mesh   HYSTERECTOMY ABDOMINAL WITH SALPINGO-OOPHORECTOMY  1960   doesn't remember why, doesn't remember being on HRT (never had menopausal symptoms)    Family History  Problem Relation Age of Onset   Diabetes Mother    Diabetes Father    Breast cancer Sister    Diabetes Sister    Cancer Sister        thinks may have had colon or stomach cancer   Prostate cancer Brother    Diabetes Brother    Cancer Brother        Liver cancer   CAD Neg Hx    Colon cancer Neg Hx    Ovarian cancer Neg Hx    Endometrial cancer Neg Hx    Pancreatic cancer Neg Hx     Social History   Socioeconomic History   Marital  status: Divorced    Spouse name: Not on file   Number of children: 2   Years of education: Not on file   Highest education level: Not on file  Occupational History   Not on file  Tobacco Use   Smoking status: Never   Smokeless tobacco: Never  Vaping Use   Vaping Use: Never used  Substance and Sexual Activity   Alcohol use: No   Drug use: No   Sexual activity: Not Currently  Other Topics Concern   Not on file  Social History Narrative   Not on file   Social Determinants of Health   Financial Resource Strain: Not on file  Food  Insecurity: Not on file  Transportation Needs: Not on file  Physical Activity: Not on file  Stress: Not on file  Social Connections: Not on file    Current Medications:  Current Outpatient Medications:    acetaminophen (TYLENOL) 325 MG tablet, Take 650 mg by mouth as needed., Disp: , Rfl:    amLODipine (NORVASC) 5 MG tablet, Take 10 mg by mouth daily., Disp: , Rfl:    apixaban (ELIQUIS) 5 MG TABS tablet, Take 1 tablet (5 mg total) by mouth 2 (two) times daily., Disp: 60 tablet, Rfl: 3   aspirin EC 81 MG tablet, Take 81 mg by mouth daily., Disp: , Rfl:    hydrochlorothiazide (HYDRODIURIL) 25 MG tablet, TAKE 1 TABLET BY MOUTH IN THE MORNING (Patient taking differently: Take 25 mg by mouth daily.), Disp: 90 tablet, Rfl: 0   polyethylene glycol (MIRALAX / GLYCOLAX) 17 g packet, Take 17 g by mouth daily., Disp: , Rfl:   Review of Systems: Denies fevers, chills, fatigue, unexplained weight changes. Denies hearing loss, neck lumps or masses, mouth sores, ringing in ears or voice changes. Denies cough or wheezing.  Denies shortness of breath. Denies chest pain or palpitations. Denies leg swelling. Denies abdominal distention, pain, blood in stools, diarrhea, nausea, vomiting, or early satiety. Denies pain with intercourse, dysuria, hematuria or incontinence. Denies hot flashes, pelvic pain, vaginal bleeding or vaginal discharge.   Denies joint pain, back pain or muscle pain/cramps. Denies itching, rash, or wounds. Denies dizziness, headaches, numbness or seizures. Denies swollen lymph nodes or glands, denies easy bruising or bleeding. Denies anxiety, depression, confusion, or decreased concentration.  Physical Exam: BP (!) 154/67 (BP Location: Left Arm, Patient Position: Sitting)   Pulse 75   Temp 98.6 F (37 C) (Oral)   Resp 20   Ht 5' 6"  (1.676 m)   Wt 208 lb (94.3 kg)   SpO2 99%   BMI 33.57 kg/m  General: Alert, oriented, no acute distress. HEENT: Normocephalic, atraumatic,  sclera anicteric. Chest: Clear to auscultation bilaterally.  No wheezes or rhonchi. Cardiovascular: Regular rate and rhythm, no murmurs, rubs, or gallops.  Abdomen: Obese. Normoactive bowel sounds. Soft, nondistended, nontender to palpation. No masses or hepatosplenomegaly appreciated. No palpable fluid wave.  Healed midline laparotomy incision. Extremities: Grossly normal range of motion. Warm, well perfused. No edema bilaterally.  Skin: No rashes or lesions.  Lymphatics: No cervical, supraclavicular, or inguinal adenopathy. GU: Normal appearing external genitalia without erythema, excoriation, or lesions.  Speculum exam reveals moderately atrophic vaginal mucosa with radiation changes appreciated.  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  Very scant bleeding with manipulation of the speculum from the left vaginal apex.  Biopsy taken of this area.  Bimanual exam  reveals no nodularity or masses.  Rectovaginal exam confirms findings.    Vaginal biopsy Procedure: Vaginal cuff biopsy Preoperative diagnosis: Vaginal cancer, status postradiation therapy Postoperative diagnosis: Same as above Specimen: Vaginal cuff biopsy, left apex Estimated blood loss: Minimal Procedure: After exam was performed and findings discussed with the patient, verbal consent was obtained for vaginal biopsy.  The vaginal apex was cleansed with Betadine x3.  Tischler forceps were then used to biopsy most of the pedunculated lesion.  This was placed in formalin to be sent to pathology.  Overall the patient tolerated the procedure well.  All instruments were removed from the vagina.  Laboratory & Radiologic Studies: None new  Assessment & Plan: Megan MCDUFFEE is a 86 y.o. woman with Stage III SCC of the vagina who presents for follow-up after completing radiation therapy.  Patient has done very well with radiation  treatment.  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  Biopsy performed to rule out residual disease.  Overall by exam, she has had an excellent response to treatment.  We will plan for PET evaluate treatment response.  Patient was encouraged to continue using her vaginal dilator.  We discussed the importance of this and preventing vaginal agglutination.  Discussed surveillance plan.  We will alternate visits every 3 months initially between my office and radiation oncology.  These visits will include a review of symptoms and pelvic exam.  Plan for early vaginal Pap with HPV testing.  Discussed signs and symptoms that be concerning for cancer recurrence.  22 minutes of total time was spent for this patient encounter, including preparation, face-to-face counseling with the patient and coordination of care, and documentation of the encounter.  Jeral Pinch, MD  Division of Gynecologic Oncology  Department of Obstetrics and Gynecology  [facility]

## 2022-04-15 ENCOUNTER — Encounter: Payer: Self-pay | Admitting: Gynecologic Oncology

## 2022-04-15 ENCOUNTER — Inpatient Hospital Stay (HOSPITAL_BASED_OUTPATIENT_CLINIC_OR_DEPARTMENT_OTHER): Payer: Medicare HMO | Admitting: Gynecologic Oncology

## 2022-04-15 ENCOUNTER — Encounter: Payer: Self-pay | Admitting: Hematology and Oncology

## 2022-04-15 ENCOUNTER — Inpatient Hospital Stay: Payer: Medicare HMO | Admitting: Hematology and Oncology

## 2022-04-15 ENCOUNTER — Inpatient Hospital Stay: Payer: Medicare HMO | Attending: Hematology and Oncology

## 2022-04-15 VITALS — BP 157/74 | HR 64 | Temp 98.3°F | Resp 18 | Ht 66.0 in | Wt 207.0 lb

## 2022-04-15 VITALS — BP 154/67 | HR 75 | Temp 98.6°F | Resp 20 | Ht 66.0 in | Wt 208.0 lb

## 2022-04-15 DIAGNOSIS — C539 Malignant neoplasm of cervix uteri, unspecified: Secondary | ICD-10-CM | POA: Diagnosis not present

## 2022-04-15 DIAGNOSIS — C52 Malignant neoplasm of vagina: Secondary | ICD-10-CM

## 2022-04-15 DIAGNOSIS — Z923 Personal history of irradiation: Secondary | ICD-10-CM | POA: Diagnosis not present

## 2022-04-15 DIAGNOSIS — N888 Other specified noninflammatory disorders of cervix uteri: Secondary | ICD-10-CM

## 2022-04-15 DIAGNOSIS — I824Z1 Acute embolism and thrombosis of unspecified deep veins of right distal lower extremity: Secondary | ICD-10-CM | POA: Diagnosis not present

## 2022-04-15 DIAGNOSIS — N939 Abnormal uterine and vaginal bleeding, unspecified: Secondary | ICD-10-CM

## 2022-04-15 LAB — CBC WITH DIFFERENTIAL/PLATELET
Abs Immature Granulocytes: 0.01 10*3/uL (ref 0.00–0.07)
Basophils Absolute: 0 10*3/uL (ref 0.0–0.1)
Basophils Relative: 0 %
Eosinophils Absolute: 0 10*3/uL (ref 0.0–0.5)
Eosinophils Relative: 1 %
HCT: 32.9 % — ABNORMAL LOW (ref 36.0–46.0)
Hemoglobin: 10.6 g/dL — ABNORMAL LOW (ref 12.0–15.0)
Immature Granulocytes: 0 %
Lymphocytes Relative: 18 %
Lymphs Abs: 0.6 10*3/uL — ABNORMAL LOW (ref 0.7–4.0)
MCH: 31.2 pg (ref 26.0–34.0)
MCHC: 32.2 g/dL (ref 30.0–36.0)
MCV: 96.8 fL (ref 80.0–100.0)
Monocytes Absolute: 0.4 10*3/uL (ref 0.1–1.0)
Monocytes Relative: 12 %
Neutro Abs: 2.3 10*3/uL (ref 1.7–7.7)
Neutrophils Relative %: 69 %
Platelets: 178 10*3/uL (ref 150–400)
RBC: 3.4 MIL/uL — ABNORMAL LOW (ref 3.87–5.11)
RDW: 14.3 % (ref 11.5–15.5)
WBC: 3.3 10*3/uL — ABNORMAL LOW (ref 4.0–10.5)
nRBC: 0 % (ref 0.0–0.2)

## 2022-04-15 LAB — BASIC METABOLIC PANEL - CANCER CENTER ONLY
Anion gap: 4 — ABNORMAL LOW (ref 5–15)
BUN: 23 mg/dL (ref 8–23)
CO2: 31 mmol/L (ref 22–32)
Calcium: 9.8 mg/dL (ref 8.9–10.3)
Chloride: 106 mmol/L (ref 98–111)
Creatinine: 0.91 mg/dL (ref 0.44–1.00)
GFR, Estimated: >60 mL/min (ref >60)
Glucose, Bld: 87 mg/dL (ref 70–99)
Potassium: 3.5 mmol/L (ref 3.5–5.1)
Sodium: 141 mmol/L (ref 135–145)

## 2022-04-15 NOTE — Assessment & Plan Note (Signed)
She has completed recent radiation treatment PET/CT imaging is scheduled for next week If she has complete response to therapy, I would recommend discontinuation of apixaban

## 2022-04-15 NOTE — Assessment & Plan Note (Signed)
She had recent DVT, provoked due to dehydration and cancer diagnosis She has completed more than 4 months worth of anticoagulation therapy I would like to see resolution of active disease before recommending discontinuation of anticoagulation therapy She is in agreement

## 2022-04-15 NOTE — Patient Instructions (Addendum)
It was good to see you today. You have had a very good response to treatment based on your exam.  I would like to get a PET scan to evaluate your response to treatment - I will call you with these results once I have them.  I will also call you once I have the biopsy results from today.  From a follow-up standpoint, we will continue with visits alternating between my office and Dr. Sondra Come every three months. You are scheduled to see him in January. Please call my office in January to schedule a visit to see me in April.  If you develop any bleeding, discharge, pelvic pain, unintentional weight loss, change to bowel function, or any other concerning symptom before your next visit, please call to see me sooner.

## 2022-04-15 NOTE — Progress Notes (Signed)
[place] OFFICE PROGRESS NOTE  Patient Care Team: [name], MD as PCP - General (Internal Medicine)  ASSESSMENT & PLAN:  Vaginal cancer [facility]) She has completed recent radiation treatment PET/CT imaging is scheduled for next week If she has complete response to therapy, I would recommend discontinuation of apixaban  DVT, lower extremity, distal, acute, right ([place]) She had recent DVT, provoked due to dehydration and cancer diagnosis She has completed more than 4 months worth of anticoagulation therapy I would like to see resolution of active disease before recommending discontinuation of anticoagulation therapy She is in agreement  No orders of the defined types were placed in this encounter.   All questions were answered. The patient knows to call the clinic with any problems, questions or concerns. The total time spent in the appointment was 20 minutes encounter with patients including review of chart and various tests results, discussions about plan of care and coordination of care plan   Heath Lark, MD 04/15/2022 10:37 AM  INTERVAL HISTORY: Please see below for problem oriented charting. she returns for treatment follow-up on anticoagulation therapy for provoked DVT She is doing well Denies leg pain or swelling No recent bleeding complications Denies recent vaginal bleeding after completion of radiation treatment  REVIEW OF SYSTEMS:   Constitutional: Denies fevers, chills or abnormal weight loss Eyes: Denies blurriness of vision Ears, nose, mouth, throat, and face: Denies mucositis or sore throat Respiratory: Denies cough, dyspnea or wheezes Cardiovascular: Denies palpitation, chest discomfort or lower extremity swelling Gastrointestinal:  Denies nausea, heartburn or change in bowel habits Skin: Denies abnormal skin rashes Lymphatics: Denies new lymphadenopathy or easy bruising Neurological:Denies numbness, tingling or new  weaknesses Behavioral/Psych: Mood is stable, no new changes  All other systems were reviewed with the patient and are negative.  I have reviewed the past medical history, past surgical history, social history and family history with the patient and they are unchanged from previous note.  ALLERGIES:  has No Known Allergies.  MEDICATIONS:  Current Outpatient Medications  Medication Sig Dispense Refill   acetaminophen (TYLENOL) 325 MG tablet Take 650 mg by mouth as needed.     amLODipine (NORVASC) 5 MG tablet Take 10 mg by mouth daily.     apixaban (ELIQUIS) 5 MG TABS tablet Take 1 tablet (5 mg total) by mouth 2 (two) times daily. 60 tablet 3   aspirin EC 81 MG tablet Take 81 mg by mouth daily.     hydrochlorothiazide (HYDRODIURIL) 25 MG tablet TAKE 1 TABLET BY MOUTH IN THE MORNING (Patient taking differently: Take 25 mg by mouth daily.) 90 tablet 0   polyethylene glycol (MIRALAX / GLYCOLAX) 17 g packet Take 17 g by mouth daily.     No current facility-administered medications for this visit.    SUMMARY OF ONCOLOGIC HISTORY: Oncology History Overview Note  PD-L1 CPS 50%   Vaginal cancer ([place])  09/16/2021 Initial Diagnosis   The patient initially presented on 3/24 with several weeks of postmenopausal bleeding.    09/17/2021 Imaging   Ct abdomen and pelvis New 6 cm ill-defined soft tissue mass centered in the region of the cervix and upper vagina, with possible involvement of the rectum and/or bladder. This is suspicious for cervical carcinoma. Recommend GYN consultation, and consider pelvic MRI without and with contrast for further evaluation.   Several tiny calcified uterine fibroids.   No evidence of metastatic disease.   Stable small benign left adrenal adenoma.   09/27/2021 Pathology Results   A.  VAGINAL SIDEWALL, BIOPSY:  -  Invasive squamous cell carcinoma, keratinizing  -  See comment   10/02/2021 Initial Diagnosis   Cervical cancer ([place])   10/08/2021 PET scan   1. Marked  hypermetabolism associated with the cervical mass consistent with cervical cancer. 2. Small hypermetabolic right pelvic sidewall node is suspicious for metastatic adenopathy. 3. No omental or peritoneal surface lesions are identified.  4. No findings for metastatic disease involving the chest or bony structures. 5. Diffuse hypermetabolism in the thyroid gland likely reflecting thyroiditis. 6. Age related atherosclerotic disease.       10/14/2021 Cancer Staging   Staging form: Vagina, AJCC 8th Edition - Clinical stage from 10/14/2021: Stage III (cT2b, cN1, cM0) - Signed by Heath Lark, MD on 10/14/2021 Stage prefix: Initial diagnosis   11/17/2021 - 02/09/2022 Radiation Therapy   Site Technique Total Dose (Gy) Dose per Fx (Gy) Completed Fx Beam Energies  Pelvis: Pelvis IMRT 45/45 1.8 25/25 6X  Pelvis: Pelvis_Bst 3D 9/9 1.8 5/5 10X, 15X  Vagina: Pelvis_Bst_HDR HDR-brachy 24/24 6 4/4 Ir-192       PHYSICAL EXAMINATION: ECOG PERFORMANCE STATUS: 0 - Asymptomatic  Vitals:   04/15/22 0950  BP: (!) 157/74  Pulse: 64  Resp: 18  Temp: 98.3 F (36.8 C)  SpO2: 98%   Filed Weights   04/15/22 0950  Weight: 207 lb (93.9 kg)    GENERAL:alert, no distress and comfortable SKIN: skin color, texture, turgor are normal, no rashes or significant lesions EYES: normal, Conjunctiva are pink and non-injected, sclera clear OROPHARYNX:no exudate, no erythema and lips, buccal mucosa, and tongue normal  NECK: supple, thyroid normal size, non-tender, without nodularity LYMPH:  no palpable lymphadenopathy in the cervical, axillary or inguinal LUNGS: clear to auscultation and percussion with normal breathing effort HEART: regular rate & rhythm and no murmurs and no lower extremity edema ABDOMEN:abdomen soft, non-tender and normal bowel sounds Musculoskeletal:no cyanosis of digits and no clubbing  NEURO: alert & oriented x 3 with fluent speech, no focal motor/sensory deficits  LABORATORY DATA:  I have  reviewed the data as listed    Component Value Date/Time   NA 141 04/15/2022 0923   NA 140 10/05/2020 0951   K 3.5 04/15/2022 0923   CL 106 04/15/2022 0923   CO2 31 04/15/2022 0923   GLUCOSE 87 04/15/2022 0923   BUN 23 04/15/2022 0923   BUN 20 10/05/2020 0951   CREATININE 0.91 04/15/2022 0923   CREATININE 1.06 (H) 07/09/2021 0000   CALCIUM 9.8 04/15/2022 0923   PROT 7.5 12/11/2021 2143   PROT 7.9 09/30/2019 0914   ALBUMIN 2.9 (L) 12/11/2021 2143   ALBUMIN 3.8 09/30/2019 0914   AST 33 12/11/2021 2143   AST 21 12/08/2021 1100   ALT 40 12/11/2021 2143   ALT 22 12/08/2021 1100   ALKPHOS 55 12/11/2021 2143   BILITOT 0.4 12/11/2021 2143   BILITOT 0.4 12/08/2021 1100   GFRNONAA >60 04/15/2022 0923   GFRAA 68 04/06/2020 0813    No results found for: "SPEP", "UPEP"  Lab Results  Component Value Date   WBC 3.3 (L) 04/15/2022   NEUTROABS 2.3 04/15/2022   HGB 10.6 (L) 04/15/2022   HCT 32.9 (L) 04/15/2022   MCV 96.8 04/15/2022   PLT 178 04/15/2022      Chemistry      Component Value Date/Time   NA 141 04/15/2022 0923   NA 140 10/05/2020 0951   K 3.5 04/15/2022 0923   CL 106 04/15/2022 0923   CO2 31  04/15/2022 0923   BUN 23 04/15/2022 0923   BUN 20 10/05/2020 0951   CREATININE 0.91 04/15/2022 0923   CREATININE 1.06 (H) 07/09/2021 0000      Component Value Date/Time   CALCIUM 9.8 04/15/2022 0923   ALKPHOS 55 12/11/2021 2143   AST 33 12/11/2021 2143   AST 21 12/08/2021 1100   ALT 40 12/11/2021 2143   ALT 22 12/08/2021 1100   BILITOT 0.4 12/11/2021 2143   BILITOT 0.4 12/08/2021 1100

## 2022-04-18 LAB — SURGICAL PATHOLOGY

## 2022-04-19 ENCOUNTER — Telehealth: Payer: Self-pay

## 2022-04-19 NOTE — Telephone Encounter (Signed)
Per Dr. Berline Lopes, I reached out to patient. LVM for her to call office regarding results.   Please let her know biopsy from her visit showed dead tissue, no precancer or cancer.

## 2022-04-20 NOTE — Telephone Encounter (Signed)
Patient called back and was given biopsy results. Patient verbalized understanding and had no questions at this time.

## 2022-04-20 NOTE — Telephone Encounter (Signed)
Voicemail left for patient to call me back regarding lab results  I also, Sent a MyChart message requesting pt call Office.

## 2022-04-22 ENCOUNTER — Encounter (HOSPITAL_COMMUNITY): Admission: RE | Admit: 2022-04-22 | Payer: Medicare HMO | Source: Ambulatory Visit

## 2022-04-25 ENCOUNTER — Telehealth: Payer: Self-pay | Admitting: *Deleted

## 2022-04-25 NOTE — Telephone Encounter (Signed)
Rescheduled PET scan to 11/9. Patient given the new date/time/instructions

## 2022-05-05 ENCOUNTER — Encounter (HOSPITAL_COMMUNITY)
Admission: RE | Admit: 2022-05-05 | Discharge: 2022-05-05 | Disposition: A | Discharge status: Home / Self Care | Payer: Medicare HMO | Source: Ambulatory Visit | Attending: Gynecologic Oncology | Admitting: Gynecologic Oncology

## 2022-05-05 DIAGNOSIS — C52 Malignant neoplasm of vagina: Secondary | ICD-10-CM | POA: Diagnosis present

## 2022-05-05 LAB — GLUCOSE, CAPILLARY: Glucose-Capillary: 94 mg/dL (ref 70–99)

## 2022-05-05 MED ORDER — FLUDEOXYGLUCOSE F - 18 (FDG) INJECTION
10.3000 | Freq: Once | INTRAVENOUS | Status: AC | PRN
  Administered 2022-05-05: 10.3 via INTRAVENOUS

## 2022-05-16 ENCOUNTER — Telehealth: Payer: Self-pay

## 2022-05-16 NOTE — Telephone Encounter (Signed)
Pt is aware of recent PET scan results.  Per Dr.Tucker, things look much improved with good response from treatment.  Pt was excited to hear this and thankful for the call

## 2022-07-11 NOTE — Progress Notes (Incomplete)
Megan Ryan is here today for follow up post radiation to the pelvic.  They completed their radiation on: ***   Does the patient complain of any of the following:  Pain:*** Abdominal bloating: *** Diarrhea/Constipation: *** Nausea/Vomiting: *** Vaginal Discharge: *** Blood in Urine or Stool: *** Urinary Issues (dysuria/incomplete emptying/ incontinence/ increased frequency/urgency): *** Does patient report using vaginal dilator 2-3 times a week and/or sexually active 2-3 weeks: *** Post radiation skin changes: ***   Additional comments if applicable:

## 2022-07-17 NOTE — Progress Notes (Incomplete)
Radiation Oncology         [phone] ________________________________  Name: Megan Ryan MRN: [id number]  Date: 07/18/2022  DOB: 1936-03-29  Follow-Up Visit Note  CC: [name], MD  [name], MD    ICD-10-CM   1. Vaginal cancer ([place])  C52       Diagnosis: The encounter diagnosis was Vaginal cancer ([place]).   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 Since her last visit, the patient followed up with Dr. Berline Lopes on 04/15/22. Speculum exam performed during this visit showed a moderately atrophic vaginal mucosa with radiation changes appreciated. 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 Very scant bleeding was noted upon manipulation of the speculum from the left vaginal apex. Biopsy of the left vaginal apex collected showed no evidence of malignancy (with fibrinopurulent exudate with foreign body giant cells).   The patient also recently followed up with Dr. Alvy Bimler on 04/15/22. During which time, Dr. Alvy Bimler recommended discontinuing apixaban pending her PET scan (detailed below) showing a complete response to therapy.   Restaging PET scan on 05/05/22 showed an interval response to therapy demonstrated by a decrease in size and hypermetabolism involving the cervical/upper vaginal primary carcinoma. PET otherwise showed no evidence of metastatic disease. Other  findings of potential clinical significance included a moderate pericardial effusion, a 1.6 cm hypoattenuating lesion in the right kidney, and diffuse thyroid uptake.        ***                  Allergies:  has No Known Allergies.  Meds: Current Outpatient Medications  Medication Sig Dispense Refill   acetaminophen (TYLENOL) 325 MG tablet Take 650 mg by mouth as needed.     amLODipine (NORVASC) 5 MG tablet Take 10 mg by mouth daily.     apixaban (ELIQUIS) 5 MG TABS tablet Take 1 tablet (5 mg total) by mouth 2 (two) times daily. 60 tablet 3   aspirin EC 81 MG tablet Take 81 mg by mouth daily.     hydrochlorothiazide (HYDRODIURIL) 25 MG tablet TAKE 1 TABLET BY MOUTH IN THE MORNING (Patient taking differently: Take 25 mg by mouth daily.) 90 tablet 0   polyethylene glycol (MIRALAX / GLYCOLAX) 17 g packet Take 17 g by mouth daily.     No current facility-administered medications for this encounter.    Physical Findings: The patient is in no acute distress. Patient is alert and oriented.  vitals were not taken for this visit. .  No significant changes. Lungs are clear to auscultation bilaterally. Heart has regular rate and rhythm. No palpable cervical, supraclavicular, or axillary adenopathy. Abdomen soft, non-tender, normal bowel sounds.  On pelvic examination the external genitalia were unremarkable. A speculum exam was performed. There are no mucosal lesions noted in the vaginal vault. A Pap smear was obtained of the proximal vagina. On bimanual and rectovaginal examination there were no  pelvic masses appreciated. ***   Lab Findings: Lab Results  Component Value Date   WBC 3.3 (L) 04/15/2022   HGB 10.6 (L) 04/15/2022   HCT 32.9 (L) 04/15/2022   MCV 96.8 04/15/2022   PLT 178 04/15/2022    Radiographic Findings: No results found.  Impression:  The encounter diagnosis was Vaginal cancer ([place]).   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  ***  Plan:  ***   *** minutes of total time was spent for this patient encounter, including preparation, face-to-face counseling with the patient and coordination of care, physical exam, and documentation of the encounter. ____________________________________  Blair Promise, PhD, MD  This document serves as a record of services personally performed by Gery Pray, MD. It was created on his behalf by Roney Mans, a trained medical scribe. The creation of this record is based on the scribe's personal observations and the provider's statements to them. This document has been checked and approved by the attending provider.

## 2022-07-18 ENCOUNTER — Ambulatory Visit
Admission: RE | Admit: 2022-07-18 | Discharge: 2022-07-18 | Disposition: A | Discharge status: Home / Self Care | Payer: Medicare HMO | Source: Ambulatory Visit | Attending: Radiation Oncology | Admitting: Radiation Oncology

## 2022-07-18 ENCOUNTER — Telehealth: Payer: Self-pay | Admitting: *Deleted

## 2022-07-18 DIAGNOSIS — C52 Malignant neoplasm of vagina: Secondary | ICD-10-CM

## 2022-07-18 NOTE — Progress Notes (Signed)
Patient states that she needs to reschedule her appointment with Dr. Sondra Come today. Rn will notify the scheduler.

## 2022-07-18 NOTE — Telephone Encounter (Signed)
CALLED PATIENT TO RESCHEDULE FU APPT. FOR 07-18-22, SPOKE WITH PATIENT AND SHE AGREED TO COME ON 07-25-22 @ 10:30 AM

## 2022-07-22 NOTE — Progress Notes (Signed)
Megan Ryan is here today for follow up post radiation to the pelvic.  They completed their radiation on: 02/09/22   Does the patient complain of any of the following:  Pain:No Abdominal bloating: Patient reports increased gas.  Diarrhea/Constipation: No Nausea/Vomiting: Yes, Vomiting.  Vaginal Discharge: No Blood in Urine or Stool: No Urinary Issues (dysuria/incomplete emptying/ incontinence/ increased frequency/urgency): No Does patient report using vaginal dilator 2-3 times a week and/or sexually active 2-3 weeks:  No Post radiation skin changes: No   Additional comments if applicable:  BP (!) 292/44 (BP Location: Left Arm, Patient Position: Sitting, Cuff Size: Large)   Pulse 74   Temp 97.9 F (36.6 C)   Resp 20   Ht '5\' 7"'$  (1.702 m)   Wt 205 lb 3.2 oz (93.1 kg)   SpO2 100%   BMI 32.14 kg/m

## 2022-07-22 NOTE — Progress Notes (Signed)
Radiation Oncology         [phone] ________________________________  Name: Megan Ryan MRN: [id number]  Date: 07/25/2022  DOB: Sep 17, 1935  Follow-Up Visit Note  CC: Megan Ebbs, MD  Megan Ebbs, MD    ICD-10-CM   1. Vaginal cancer ([place])  C52       Diagnosis: The encounter diagnosis was Vaginal cancer ([place]).   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 Since her last visit, the patient followed up with Dr. Berline Ryan on 04/15/22. Speculum exam performed during this visit showed a moderately atrophic vaginal mucosa with radiation changes appreciated. 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 Very scant bleeding was noted upon manipulation of the speculum from the left vaginal apex. Biopsy of the left vaginal apex collected showed no evidence of malignancy (with fibrinopurulent exudate with foreign body giant cells).   The patient also recently followed up with Dr. Alvy Ryan on 04/15/22. During which time, Dr. Alvy Ryan recommended discontinuing apixaban pending her PET scan (detailed below) showing a complete response to therapy.   Restaging PET scan on 05/05/22 showed an interval response to therapy demonstrated by a decrease in size and hypermetabolism involving the cervical/upper vaginal primary carcinoma. PET otherwise showed no evidence of metastatic disease. Other  findings of potential clinical significance included a moderate pericardial effusion, a 1.6 cm hypoattenuating lesion in the right kidney, and diffuse thyroid uptake.        She decided not to use the vaginal dilator given her advanced age.  She reports her appetite is improving but not back to normal.  She denies any pelvic pain abdominal bloating vaginal bleeding or discharge.  She denies any hematuria or rectal bleeding.                  Allergies:  has No Known Allergies.  Meds: Current Outpatient Medications  Medication Sig Dispense Refill   acetaminophen (TYLENOL) 325 MG tablet Take 650 mg by mouth as needed.     amLODipine (NORVASC) 5 MG tablet Take 10 mg by mouth daily.     aspirin EC 81 MG tablet Take 81 mg by mouth daily.     hydrochlorothiazide (HYDRODIURIL) 25 MG tablet TAKE 1 TABLET BY MOUTH IN THE MORNING (Patient taking differently: Take 25 mg by mouth daily.) 90 tablet 0   polyethylene glycol (MIRALAX / GLYCOLAX) 17 g packet Take 17 g by mouth daily.     apixaban (ELIQUIS) 5 MG TABS tablet Take 1 tablet (5 mg total) by mouth 2 (two) times daily. (Patient not taking: Reported on 07/25/2022) 60 tablet 3   No current facility-administered medications for this encounter.    Physical Findings: The patient is in no acute distress. Patient is alert and oriented.  height is '5\' 7"'$  (1.702 m) and weight is 205 lb 3.2 oz (93.1 kg). Her temperature is 97.9 F (36.6 C). Her blood pressure is 158/65 (abnormal) and  her pulse is 74. Her respiration is 20 and oxygen saturation is 100%. .   Lungs are clear to auscultation bilaterally. Heart has regular rate and rhythm. No palpable cervical, supraclavicular, or axillary adenopathy. Abdomen soft, non-tender, normal bowel sounds.  No inguinal adenopathy appreciated  On pelvic examination the external genitalia were unremarkable. A speculum exam was performed.  Radiation changes are noted in the proximal vagina.  Slight amount of white is  discoloration along the left vaginal apex.  Biopsied previously.  Slight bleeding with manipulation with the speculum.  On bimanual and rectovaginal examination no pelvic masses appreciated.  Rectal sphincter tone good.  Lab Findings: Lab Results  Component Value Date   WBC 3.3 (L) 04/15/2022   HGB 10.6 (L) 04/15/2022   HCT 32.9 (L) 04/15/2022   MCV 96.8 04/15/2022   PLT 178 04/15/2022    Radiographic Findings: No results found.  Impression:  The encounter diagnosis was Vaginal cancer ([place]).   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  Recent PET CT scan also favorable.   Plan: The patient will see Dr. Berline Ryan in 3 months.  Routine follow-up in radiation oncology in 6 months.   25 minutes of total time was spent for this patient encounter, including preparation, face-to-face counseling with the patient and coordination of care, physical exam, and documentation of the encounter. ____________________________________  Megan Promise, PhD, MD  This document serves as a record of services personally performed by Megan Pray, MD. It was created on his behalf by Megan Ryan, a trained medical scribe. The creation of this record is based on the scribe's personal observations and the provider's statements to them. This document has been checked and approved by the attending provider.

## 2022-07-25 ENCOUNTER — Ambulatory Visit
Admission: RE | Admit: 2022-07-25 | Discharge: 2022-07-25 | Disposition: A | Discharge status: Home / Self Care | Payer: Medicare HMO | Source: Ambulatory Visit | Attending: Radiation Oncology | Admitting: Radiation Oncology

## 2022-07-25 ENCOUNTER — Encounter: Payer: Self-pay | Admitting: Radiation Oncology

## 2022-07-25 VITALS — BP 158/65 | HR 74 | Temp 97.9°F | Resp 20 | Ht 67.0 in | Wt 205.2 lb

## 2022-07-25 DIAGNOSIS — Z923 Personal history of irradiation: Secondary | ICD-10-CM | POA: Diagnosis not present

## 2022-07-25 DIAGNOSIS — Z7982 Long term (current) use of aspirin: Secondary | ICD-10-CM | POA: Diagnosis not present

## 2022-07-25 DIAGNOSIS — Z79899 Other long term (current) drug therapy: Secondary | ICD-10-CM | POA: Diagnosis not present

## 2022-07-25 DIAGNOSIS — Z7901 Long term (current) use of anticoagulants: Secondary | ICD-10-CM | POA: Diagnosis not present

## 2022-07-25 DIAGNOSIS — C52 Malignant neoplasm of vagina: Secondary | ICD-10-CM

## 2022-07-25 DIAGNOSIS — Z8544 Personal history of malignant neoplasm of other female genital organs: Secondary | ICD-10-CM | POA: Insufficient documentation

## 2022-08-26 ENCOUNTER — Telehealth: Payer: Self-pay

## 2022-08-26 ENCOUNTER — Telehealth: Payer: Self-pay | Admitting: *Deleted

## 2022-08-26 NOTE — Telephone Encounter (Signed)
Called patient to inform of fu with Dr. Berline Lopes on 10-20-22- arrival time- 1:30 pm, spoke with patient and she is aware of this appt.

## 2022-08-26 NOTE — Telephone Encounter (Signed)
Enid Derry from (RAD ONC) called office.  Pt is scheduled for a follow up on 10/20/22  at 1:45 with Dr. Berline Lopes.  Enid Derry to notify pt of appointment date and time.

## 2022-10-20 ENCOUNTER — Other Ambulatory Visit: Payer: Self-pay

## 2022-10-20 ENCOUNTER — Encounter: Payer: Self-pay | Admitting: Gynecologic Oncology

## 2022-10-20 ENCOUNTER — Inpatient Hospital Stay: Payer: Medicare HMO | Attending: Gynecologic Oncology | Admitting: Gynecologic Oncology

## 2022-10-20 VITALS — BP 142/70 | HR 79 | Temp 99.3°F | Wt 199.0 lb

## 2022-10-20 DIAGNOSIS — C52 Malignant neoplasm of vagina: Secondary | ICD-10-CM

## 2022-10-20 DIAGNOSIS — Z08 Encounter for follow-up examination after completed treatment for malignant neoplasm: Secondary | ICD-10-CM | POA: Insufficient documentation

## 2022-10-20 DIAGNOSIS — Z923 Personal history of irradiation: Secondary | ICD-10-CM | POA: Insufficient documentation

## 2022-10-20 DIAGNOSIS — Z8544 Personal history of malignant neoplasm of other female genital organs: Secondary | ICD-10-CM | POA: Diagnosis not present

## 2022-10-20 NOTE — Patient Instructions (Addendum)
It was good to see you today.  I do not see anything with in the vagina but given findings on your rectal exam, I have ordered a CT scan of your abdomen and pelvis.  I will call you with these results.  Please keep using your vaginal dilator.  This is important to help prevent scar tissue in the vagina that can limit our exam.  I will see you for follow-up in 6 months.  As always, if you develop any new and concerning symptoms before your next visit, please call to see me sooner.

## 2022-10-20 NOTE — Progress Notes (Signed)
Gynecologic Oncology Return Clinic Visit  10/20/22  Reason for Visit: Surveillance visit in the setting of vaginal cancer   Treatment History: Oncology History Overview Note  PD-L1 CPS 50%   Vaginal cancer  09/16/2021 Initial Diagnosis   The patient initially presented on 3/24 with several weeks of postmenopausal bleeding.    09/17/2021 Imaging   Ct abdomen and pelvis New 6 cm ill-defined soft tissue mass centered in the region of the cervix and upper vagina, with possible involvement of the rectum and/or bladder. This is suspicious for cervical carcinoma. Recommend GYN consultation, and consider pelvic MRI without and with contrast for further evaluation.   Several tiny calcified uterine fibroids.   No evidence of metastatic disease.   Stable small benign left adrenal adenoma.   09/27/2021 Pathology Results   A. VAGINAL SIDEWALL, BIOPSY:  -  Invasive squamous cell carcinoma, keratinizing  -  See comment   10/02/2021 Initial Diagnosis   Cervical cancer ([name])   10/08/2021 PET scan   1. Marked hypermetabolism associated with the cervical mass consistent with cervical cancer. 2. Small hypermetabolic right pelvic sidewall node is suspicious for metastatic adenopathy. 3. No omental or peritoneal surface lesions are identified.  4. No findings for metastatic disease involving the chest or bony structures. 5. Diffuse hypermetabolism in the thyroid gland likely reflecting thyroiditis. 6. Age related atherosclerotic disease.       10/14/2021 Cancer Staging   Staging form: Vagina, AJCC 8th Edition - Clinical stage from 10/14/2021: Stage III (cT2b, cN1, cM0) - Signed by Artis Delay, MD on 10/14/2021 Stage prefix: Initial diagnosis   11/17/2021 - 02/09/2022 Radiation Therapy   Site Technique Total Dose (Gy) Dose per Fx (Gy) Completed Fx Beam Energies  Pelvis: Pelvis IMRT 45/45 1.8 25/25 6X  Pelvis: Pelvis_Bst 3D 9/9 1.8 5/5 10X, 15X  Vagina: Pelvis_Bst_HDR HDR-brachy 24/24 6 4/4 Ir-192        Interval History: Patient reports doing well.  She denies any significant pelvic pain.  She endorses regular bowel function with the use of medication.  Denies any urinary symptoms.  She denies any vaginal bleeding or discharge.  Past Medical/Surgical History: Past Medical History:  Diagnosis Date   Arthritis    knee and back pain   Cervical cancer 10/02/2021   Diabetes mellitus 06/27/2016   History of radiation therapy    Vagina- 11/17/21-02/09/22-Dr. Antony Blackbird   Hypertension     Past Surgical History:  Procedure Laterality Date   HERNIA REPAIR  2003   repair with mesh   HYSTERECTOMY ABDOMINAL WITH SALPINGO-OOPHORECTOMY  1960   doesn't remember why, doesn't remember being on HRT (never had menopausal symptoms)    Family History  Problem Relation Age of Onset   Diabetes Mother    Diabetes Father    Breast cancer Sister    Diabetes Sister    Cancer Sister        thinks may have had colon or stomach cancer   Prostate cancer Brother    Diabetes Brother    Cancer Brother        Liver cancer   CAD Neg Hx    Colon cancer Neg Hx    Ovarian cancer Neg Hx    Endometrial cancer Neg Hx    Pancreatic cancer Neg Hx     Social History   Socioeconomic History   Marital status: Divorced    Spouse name: Not on file   Number of children: 2   Years of education: Not on file  Highest education level: Not on file  Occupational History   Not on file  Tobacco Use   Smoking status: Never   Smokeless tobacco: Never  Vaping Use   Vaping Use: Never used  Substance and Sexual Activity   Alcohol use: No   Drug use: No   Sexual activity: Not Currently  Other Topics Concern   Not on file  Social History Narrative   Not on file   Social Determinants of Health   Financial Resource Strain: Not on file  Food Insecurity: Not on file  Transportation Needs: Not on file  Physical Activity: Not on file  Stress: Not on file  Social Connections: Not on file    Current  Medications:  Current Outpatient Medications:    acetaminophen (TYLENOL) 325 MG tablet, Take 650 mg by mouth as needed., Disp: , Rfl:    amLODipine (NORVASC) 5 MG tablet, Take 10 mg by mouth daily., Disp: , Rfl:    aspirin EC 81 MG tablet, Take 81 mg by mouth daily., Disp: , Rfl:    hydrochlorothiazide (HYDRODIURIL) 25 MG tablet, TAKE 1 TABLET BY MOUTH IN THE MORNING (Patient taking differently: Take 25 mg by mouth daily.), Disp: 90 tablet, Rfl: 0   polyethylene glycol (MIRALAX / GLYCOLAX) 17 g packet, Take 17 g by mouth daily., Disp: , Rfl:    vitamin B-12 (CYANOCOBALAMIN) 100 MCG tablet, Take 100 mcg by mouth daily., Disp: , Rfl:   Review of Systems: Denies appetite changes, fevers, chills, fatigue, unexplained weight changes. Denies hearing loss, neck lumps or masses, mouth sores, ringing in ears or voice changes. Denies cough or wheezing.  Denies shortness of breath. Denies chest pain or palpitations. Denies leg swelling. Denies abdominal distention, pain, blood in stools, constipation, diarrhea, nausea, vomiting, or early satiety. Denies pain with intercourse, dysuria, frequency, hematuria or incontinence. Denies hot flashes, pelvic pain, vaginal bleeding or vaginal discharge.   Denies joint pain, back pain or muscle pain/cramps. Denies itching, rash, or wounds. Denies dizziness, headaches, numbness or seizures. Denies swollen lymph nodes or glands, denies easy bruising or bleeding. Denies anxiety, depression, confusion, or decreased concentration.  Physical Exam: BP (!) 151/108 (BP Location: Left Arm, Patient Position: Sitting) Comment: informed Dr Pricilla Holm. will do recheck  Pulse 79   Temp 99.3 F (37.4 C) (Oral)   Wt 199 lb (90.3 kg)   SpO2 99%   BMI 31.17 kg/m  General: Alert, oriented, no acute distress. HEENT: Normocephalic, atraumatic, sclera anicteric. Chest: Clear to auscultation bilaterally.  No wheezes or rhonchi. Cardiovascular: Regular rate and rhythm, no  murmurs. Abdomen: Obese. Normoactive bowel sounds. Soft, nondistended, nontender to palpation. No masses or hepatosplenomegaly appreciated. No palpable fluid wave.  Healed midline laparotomy incision. Extremities: Grossly normal range of motion. Warm, well perfused. No edema bilaterally.  Skin: No rashes or lesions.  Lymphatics: No cervical, supraclavicular, or inguinal adenopathy. GU: Normal appearing external genitalia without erythema, excoriation, or lesions.  Speculum exam reveals moderately atrophic vaginal mucosa with radiation changes appreciated.  There is still some discoloration along the left vaginal apex and sidewall, similar although slightly improved in appearance to her last exam with me.  Minimal bleeding with manipulation of the speculum.  On rectovaginal exam, there is some thickening that I appreciate on the rectum above the level of the vaginal apex that I do not remember on prior exams.  This area is not nodular.  Laboratory & Radiologic Studies: None new  Assessment & Plan: Megan Ryan is a 87 y.o.  woman with Stage III SCC of the vagina who presents for follow-up after completing radiation therapy. PDL1 50%.  Patient is overall doing well.  On vaginal exam, I do not see any evidence of recurrence.  Discussed findings on rectal exam with the patient and have recommended that we proceed with CT scan of the abdomen and pelvis to further evaluate.   Patient was encouraged to continue using her vaginal dilator.  We discussed the importance of this and preventing vaginal agglutination.   We will continue to alternate visits every 3 months initially between my office and radiation oncology.  These visits will include a review of symptoms and pelvic exam.  Plan for early vaginal Pap with HPV testing. She will be due for this in 01/2023 (at her next appointment with radiation oncology).  Discussed signs and symptoms that be concerning for cancer recurrence.  20 minutes of total  time was spent for this patient encounter, including preparation, face-to-face counseling with the patient and coordination of care, and documentation of the encounter.  Eugene Garnet, MD  Division of Gynecologic Oncology  Department of Obstetrics and Gynecology  [facility]

## 2022-11-10 ENCOUNTER — Ambulatory Visit (HOSPITAL_COMMUNITY)
Admission: RE | Admit: 2022-11-10 | Discharge: 2022-11-10 | Disposition: A | Discharge status: Home / Self Care | Payer: Medicare HMO | Source: Ambulatory Visit | Attending: Gynecologic Oncology | Admitting: Gynecologic Oncology

## 2022-11-10 DIAGNOSIS — C52 Malignant neoplasm of vagina: Secondary | ICD-10-CM | POA: Diagnosis not present

## 2022-11-10 LAB — POCT I-STAT CREATININE: Creatinine, Ser: 0.9 mg/dL (ref 0.44–1.00)

## 2022-11-10 MED ORDER — IOHEXOL 300 MG/ML  SOLN
100.0000 mL | Freq: Once | INTRAMUSCULAR | Status: AC | PRN
  Administered 2022-11-10: 100 mL via INTRAVENOUS

## 2022-11-10 MED ORDER — SODIUM CHLORIDE (PF) 0.9 % IJ SOLN
INTRAMUSCULAR | Status: AC
  Filled 2022-11-10: qty 50

## 2022-11-17 ENCOUNTER — Telehealth: Payer: Self-pay | Admitting: Gynecologic Oncology

## 2022-11-17 DIAGNOSIS — C52 Malignant neoplasm of vagina: Secondary | ICD-10-CM

## 2022-11-17 NOTE — Telephone Encounter (Signed)
Tried to call the patient multiple times today.  The phone rang and message machine picked up although each time disconnected.  Calling patient to review recent CT scan.  Given what I felt on exam and difficulty seeing this portion of her anatomy, I am recommending that we get a PET scan.  Eugene Garnet MD Gynecologic Oncology

## 2022-11-25 ENCOUNTER — Encounter: Payer: Self-pay | Admitting: Gynecologic Oncology

## 2022-12-05 ENCOUNTER — Telehealth: Payer: Self-pay | Admitting: Oncology

## 2022-12-05 NOTE — Telephone Encounter (Signed)
Left a message regarding PET and Dr. Maxine Glenn appointments.  Requested a return call.

## 2022-12-06 NOTE — Telephone Encounter (Signed)
Called Megan Ryan and advised her of appointment for a PET scan on 12/20/2022 with 11:30 arrival.  Gave her instructions for NPO 6 hours before, no candy, no gum.  Also scheduled an appointment with Dr. Bertis Ruddy on 12/23/22 at 10:40.  She verbalized agreement of the appointments and also asked if we could let her daughter know.  Her daughter is Elease Hashimoto and her number is [phone].

## 2022-12-06 NOTE — Telephone Encounter (Signed)
Called Megan Ryan and advised her about Tanieka's appointments on 6/25 and 6/28.

## 2022-12-12 ENCOUNTER — Telehealth: Payer: Self-pay | Admitting: *Deleted

## 2022-12-12 NOTE — Telephone Encounter (Signed)
LMOM for the patient to call the office back. Patient needs to be given appt for PET scan

## 2022-12-20 ENCOUNTER — Encounter (HOSPITAL_COMMUNITY)
Admission: RE | Admit: 2022-12-20 | Discharge: 2022-12-20 | Disposition: A | Discharge status: Home / Self Care | Payer: Medicare HMO | Source: Ambulatory Visit | Attending: Gynecologic Oncology | Admitting: Gynecologic Oncology

## 2022-12-20 DIAGNOSIS — C52 Malignant neoplasm of vagina: Secondary | ICD-10-CM | POA: Insufficient documentation

## 2022-12-20 LAB — GLUCOSE, CAPILLARY: Glucose-Capillary: 73 mg/dL (ref 70–99)

## 2022-12-20 MED ORDER — FLUDEOXYGLUCOSE F - 18 (FDG) INJECTION
10.3000 | Freq: Once | INTRAVENOUS | Status: AC
  Administered 2022-12-20: 10.347 via INTRAVENOUS

## 2022-12-22 ENCOUNTER — Telehealth: Payer: Self-pay | Admitting: Gynecologic Oncology

## 2022-12-22 NOTE — Telephone Encounter (Signed)
Called patient.  Discussed recent PET scan results which show increased FDG avidity in the area at the vaginal apex.  Based on my exam, this felt thickened and concerning for residual disease.  Patient is seeing Dr. Bertis Ruddy tomorrow.  I have already reached out to her and would recommend systemic treatment.  Eugene Garnet MD Gynecologic Oncology

## 2022-12-23 ENCOUNTER — Other Ambulatory Visit: Payer: Self-pay

## 2022-12-23 ENCOUNTER — Telehealth: Payer: Self-pay

## 2022-12-23 ENCOUNTER — Encounter: Payer: Self-pay | Admitting: Hematology and Oncology

## 2022-12-23 ENCOUNTER — Inpatient Hospital Stay: Payer: Medicare HMO | Attending: Gynecologic Oncology | Admitting: Hematology and Oncology

## 2022-12-23 VITALS — BP 147/81 | HR 73 | Temp 98.6°F | Resp 18 | Ht 67.0 in | Wt 193.8 lb

## 2022-12-23 DIAGNOSIS — C52 Malignant neoplasm of vagina: Secondary | ICD-10-CM | POA: Insufficient documentation

## 2022-12-23 NOTE — Telephone Encounter (Signed)
-----   Message from Artis Delay, MD sent at 12/23/2022 12:53 PM EDT ----- Pls refer her for hospice for recurrent cancer

## 2022-12-23 NOTE — Progress Notes (Signed)
[facility] OFFICE PROGRESS NOTE  Patient Care Team: Fleet Contras, MD as PCP - General (Internal Medicine)  ASSESSMENT & PLAN:  Vaginal cancer ([name]) Clinically, she is not symptomatic I reviewed imaging study with the patient and her daughter It appears that the patient might have signs of cancer recurrence We discussed treatment options Given her age, I would be in favor of giving her pembrolizumab given high CPS score on PD-L1 test I discussed risk, benefits, side effects of pembrolizumab but the patient declined treatment I will reach out to her GYN surgeon and radiation oncologist regarding future follow-up We discussed palliative care/hospice referral and she agree for hospice referral  Orders Placed This Encounter  Procedures   Ambulatory referral to Hospice    Referral Priority:   Routine    Referral Type:   Consultation    Referral Reason:   Specialty Services Required    Requested Specialty:   Hospice Services    Number of Visits Requested:   1    All questions were answered. The patient knows to call the clinic with any problems, questions or concerns. The total time spent in the appointment was 40 minutes encounter with patients including review of chart and various tests results, discussions about plan of care and coordination of care plan   Artis Delay, MD 12/23/2022 12:52 PM  INTERVAL HISTORY: Please see below for problem oriented charting. she returns for review test results She had recent PET/CT imaging done to follow-up on history of vaginal cancer She is not symptomatic Specifically, no pelvic pain, abnormal discharge or bleeding We discussed treatment options and review imaging studies  REVIEW OF SYSTEMS:   Constitutional: Denies fevers, chills or abnormal weight loss Eyes: Denies blurriness of vision Ears, nose, mouth, throat, and face: Denies mucositis or sore throat Respiratory: Denies cough, dyspnea or wheezes Cardiovascular: Denies  palpitation, chest discomfort or lower extremity swelling Gastrointestinal:  Denies nausea, heartburn or change in bowel habits Skin: Denies abnormal skin rashes Lymphatics: Denies new lymphadenopathy or easy bruising Neurological:Denies numbness, tingling or new weaknesses Behavioral/Psych: Mood is stable, no new changes  All other systems were reviewed with the patient and are negative.  I have reviewed the past medical history, past surgical history, social history and family history with the patient and they are unchanged from previous note.  ALLERGIES:  has No Known Allergies.  MEDICATIONS:  Current Outpatient Medications  Medication Sig Dispense Refill   acetaminophen (TYLENOL) 325 MG tablet Take 650 mg by mouth as needed.     amLODipine (NORVASC) 5 MG tablet Take 10 mg by mouth daily.     aspirin EC 81 MG tablet Take 81 mg by mouth daily.     hydrochlorothiazide (HYDRODIURIL) 25 MG tablet TAKE 1 TABLET BY MOUTH IN THE MORNING (Patient taking differently: Take 25 mg by mouth daily.) 90 tablet 0   polyethylene glycol (MIRALAX / GLYCOLAX) 17 g packet Take 17 g by mouth daily.     vitamin B-12 (CYANOCOBALAMIN) 100 MCG tablet Take 100 mcg by mouth daily.     No current facility-administered medications for this visit.    SUMMARY OF ONCOLOGIC HISTORY: Oncology History Overview Note  PD-L1 CPS 50%   Vaginal cancer ([name])  09/16/2021 Initial Diagnosis   The patient initially presented on 3/24 with several weeks of postmenopausal bleeding.    09/17/2021 Imaging   Ct abdomen and pelvis New 6 cm ill-defined soft tissue mass centered in the region of the cervix and upper vagina,  with possible involvement of the rectum and/or bladder. This is suspicious for cervical carcinoma. Recommend GYN consultation, and consider pelvic MRI without and with contrast for further evaluation.   Several tiny calcified uterine fibroids.   No evidence of metastatic disease.   Stable small benign left  adrenal adenoma.   09/27/2021 Pathology Results   A. VAGINAL SIDEWALL, BIOPSY:  -  Invasive squamous cell carcinoma, keratinizing  -  See comment   10/02/2021 Initial Diagnosis   Cervical cancer ([name])   10/08/2021 PET scan   1. Marked hypermetabolism associated with the cervical mass consistent with cervical cancer. 2. Small hypermetabolic right pelvic sidewall node is suspicious for metastatic adenopathy. 3. No omental or peritoneal surface lesions are identified.  4. No findings for metastatic disease involving the chest or bony structures. 5. Diffuse hypermetabolism in the thyroid gland likely reflecting thyroiditis. 6. Age related atherosclerotic disease.       10/14/2021 Cancer Staging   Staging form: Vagina, AJCC 8th Edition - Clinical stage from 10/14/2021: Stage III (cT2b, cN1, cM0) - Signed by Artis Delay, MD on 10/14/2021 Stage prefix: Initial diagnosis   11/17/2021 - 02/09/2022 Radiation Therapy   Site Technique Total Dose (Gy) Dose per Fx (Gy) Completed Fx Beam Energies  Pelvis: Pelvis IMRT 45/45 1.8 25/25 6X  Pelvis: Pelvis_Bst 3D 9/9 1.8 5/5 10X, 15X  Vagina: Pelvis_Bst_HDR HDR-brachy 24/24 6 4/4 Ir-192     12/23/2022 PET scan   NM PET Image Restage (PS) Skull Base to Thigh (F-18 FDG)  Result Date: 12/22/2022 CLINICAL DATA:  Subsequent treatment strategy for vaginal cancer. Biopsy in 2023. EXAM: NUCLEAR MEDICINE PET SKULL BASE TO THIGH TECHNIQUE: 10.3 mCi F-18 FDG was injected intravenously. Full-ring PET imaging was performed from the skull base to thigh after the radiotracer. CT data was obtained and used for attenuation correction and anatomic localization. Fasting blood glucose: 73 mg/dl COMPARISON:  62/95/2841 abdominopelvic CT.  PET of 05/05/2022. FINDINGS: Mild limitations secondary to patient body habitus. Mediastinal blood pool activity: SUV max 3.1 Liver activity: SUV max NA NECK: No cervical nodal hypermetabolism. Again identified is diffuse mild thyroid  hypermetabolism. Incidental CT findings: Left carotid atherosclerosis. No cervical adenopathy. CHEST: No pulmonary parenchymal or thoracic nodal hypermetabolism. Incidental CT findings: Mild cardiomegaly with small pericardial effusion, similar. Aortic and coronary artery calcification. ABDOMEN/PELVIS: No abdominopelvic nodal hypermetabolism. The eccentric left lesion at the junction of the upper vagina and cervix is slightly more distinct today. Measures a S.U.V. max of 8.5 on approximately 169/4 versus a S.U.V. max of 5.7 on the prior. No extension into the rectum. Incidental CT findings: Normal adrenal glands. No renal calculi or hydronephrosis. Aortic atherosclerosis. Prior ventral abdominopelvic wall hernia repair. Uterine fibroids. Pelvic floor laxity. Presumably radiation induced low pelvic edema. Interpolar right renal 1.7 cm hypoattenuating lesion is similar and nonspecific on 114/4. SKELETON: Diffuse low-level marrow hypermetabolism is most likely related to stimulation by chemotherapy. No suspicious focal abnormality. Incidental CT findings: None. IMPRESSION: 1. Mild progression of vaginal/cervical primary as evidenced by mild increase in hypermetabolism. 2. No evidence of tracer avid metastatic disease. 3. Mild thyroid hypermetabolism can be seen with thyroiditis. Consider correlation with laboratory values. 4. Incidental findings, including: Uterine fibroids. Small pericardial effusion. Coronary artery atherosclerosis. Aortic Atherosclerosis (ICD10-I70.0). Electronically Signed   By: Jeronimo Greaves M.D.   On: 12/22/2022 15:56        PHYSICAL EXAMINATION: ECOG PERFORMANCE STATUS: 0 - Asymptomatic  Vitals:   12/23/22 1031  BP: (!) 147/81  Pulse: 73  Resp: 18  Temp: 98.6 F (37 C)  SpO2: 100%   Filed Weights   12/23/22 1031  Weight: 193 lb 12.8 oz (87.9 kg)    GENERAL:alert, no distress and comfortable NEURO: alert & oriented x 3 with fluent speech, no focal motor/sensory  deficits  LABORATORY DATA:  I have reviewed the data as listed    Component Value Date/Time   NA 141 04/15/2022 0923   NA 140 10/05/2020 0951   K 3.5 04/15/2022 0923   CL 106 04/15/2022 0923   CO2 31 04/15/2022 0923   GLUCOSE 87 04/15/2022 0923   BUN 23 04/15/2022 0923   BUN 20 10/05/2020 0951   CREATININE 0.90 11/10/2022 1813   CREATININE 0.91 04/15/2022 0923   CREATININE 1.06 (H) 07/09/2021 0000   CALCIUM 9.8 04/15/2022 0923   PROT 7.5 12/11/2021 2143   PROT 7.9 09/30/2019 0914   ALBUMIN 2.9 (L) 12/11/2021 2143   ALBUMIN 3.8 09/30/2019 0914   AST 33 12/11/2021 2143   AST 21 12/08/2021 1100   ALT 40 12/11/2021 2143   ALT 22 12/08/2021 1100   ALKPHOS 55 12/11/2021 2143   BILITOT 0.4 12/11/2021 2143   BILITOT 0.4 12/08/2021 1100   GFRNONAA >60 04/15/2022 0923   GFRAA 68 04/06/2020 0813    No results found for: "SPEP", "UPEP"  Lab Results  Component Value Date   WBC 3.3 (L) 04/15/2022   NEUTROABS 2.3 04/15/2022   HGB 10.6 (L) 04/15/2022   HCT 32.9 (L) 04/15/2022   MCV 96.8 04/15/2022   PLT 178 04/15/2022      Chemistry      Component Value Date/Time   NA 141 04/15/2022 0923   NA 140 10/05/2020 0951   K 3.5 04/15/2022 0923   CL 106 04/15/2022 0923   CO2 31 04/15/2022 0923   BUN 23 04/15/2022 0923   BUN 20 10/05/2020 0951   CREATININE 0.90 11/10/2022 1813   CREATININE 0.91 04/15/2022 0923   CREATININE 1.06 (H) 07/09/2021 0000      Component Value Date/Time   CALCIUM 9.8 04/15/2022 0923   ALKPHOS 55 12/11/2021 2143   AST 33 12/11/2021 2143   AST 21 12/08/2021 1100   ALT 40 12/11/2021 2143   ALT 22 12/08/2021 1100   BILITOT 0.4 12/11/2021 2143   BILITOT 0.4 12/08/2021 1100       RADIOGRAPHIC STUDIES: I have reviewed imaging studies with the patient and her daughter I have personally reviewed the radiological images as listed and agreed with the findings in the report. NM PET Image Restage (PS) Skull Base to Thigh (F-18 FDG)  Result Date:  12/22/2022 CLINICAL DATA:  Subsequent treatment strategy for vaginal cancer. Biopsy in 2023. EXAM: NUCLEAR MEDICINE PET SKULL BASE TO THIGH TECHNIQUE: 10.3 mCi F-18 FDG was injected intravenously. Full-ring PET imaging was performed from the skull base to thigh after the radiotracer. CT data was obtained and used for attenuation correction and anatomic localization. Fasting blood glucose: 73 mg/dl COMPARISON:  16/03/9603 abdominopelvic CT.  PET of 05/05/2022. FINDINGS: Mild limitations secondary to patient body habitus. Mediastinal blood pool activity: SUV max 3.1 Liver activity: SUV max NA NECK: No cervical nodal hypermetabolism. Again identified is diffuse mild thyroid hypermetabolism. Incidental CT findings: Left carotid atherosclerosis. No cervical adenopathy. CHEST: No pulmonary parenchymal or thoracic nodal hypermetabolism. Incidental CT findings: Mild cardiomegaly with small pericardial effusion, similar. Aortic and coronary artery calcification. ABDOMEN/PELVIS: No abdominopelvic nodal hypermetabolism. The eccentric left lesion at the junction of the upper vagina and cervix  is slightly more distinct today. Measures a S.U.V. max of 8.5 on approximately 169/4 versus a S.U.V. max of 5.7 on the prior. No extension into the rectum. Incidental CT findings: Normal adrenal glands. No renal calculi or hydronephrosis. Aortic atherosclerosis. Prior ventral abdominopelvic wall hernia repair. Uterine fibroids. Pelvic floor laxity. Presumably radiation induced low pelvic edema. Interpolar right renal 1.7 cm hypoattenuating lesion is similar and nonspecific on 114/4. SKELETON: Diffuse low-level marrow hypermetabolism is most likely related to stimulation by chemotherapy. No suspicious focal abnormality. Incidental CT findings: None. IMPRESSION: 1. Mild progression of vaginal/cervical primary as evidenced by mild increase in hypermetabolism. 2. No evidence of tracer avid metastatic disease. 3. Mild thyroid hypermetabolism  can be seen with thyroiditis. Consider correlation with laboratory values. 4. Incidental findings, including: Uterine fibroids. Small pericardial effusion. Coronary artery atherosclerosis. Aortic Atherosclerosis (ICD10-I70.0). Electronically Signed   By: Jeronimo Greaves M.D.   On: 12/22/2022 15:56

## 2022-12-23 NOTE — Telephone Encounter (Signed)
Called [facility] and given hospice referral to [place]. She will reach out to daughter to get [facility] admitted.

## 2022-12-23 NOTE — Assessment & Plan Note (Signed)
Clinically, she is not symptomatic I reviewed imaging study with the patient and her daughter It appears that the patient might have signs of cancer recurrence We discussed treatment options Given her age, I would be in favor of giving her pembrolizumab given high CPS score on PD-L1 test I discussed risk, benefits, side effects of pembrolizumab but the patient declined treatment I will reach out to her GYN surgeon and radiation oncologist regarding future follow-up We discussed palliative care/hospice referral and she agree for hospice referral

## 2023-01-02 ENCOUNTER — Telehealth: Payer: Self-pay | Admitting: *Deleted

## 2023-01-02 NOTE — Telephone Encounter (Signed)
Telephone call to patient's daughter to provide contact information to Hospice per [place]. [phone]) At this time patient is doing fine and does not wish to enroll in hospice services. Family is still thinking she will get better. They have the information for enrollment when they are ready.

## 2023-01-23 ENCOUNTER — Ambulatory Visit
Admission: RE | Admit: 2023-01-23 | Discharge: 2023-01-23 | Disposition: A | Discharge status: Home / Self Care | Payer: Medicare HMO | Source: Ambulatory Visit | Attending: Radiation Oncology | Admitting: Radiation Oncology

## 2023-02-16 ENCOUNTER — Encounter: Payer: Self-pay | Admitting: Podiatry

## 2023-02-16 ENCOUNTER — Ambulatory Visit: Payer: Medicare HMO | Admitting: Podiatry

## 2023-02-16 DIAGNOSIS — E119 Type 2 diabetes mellitus without complications: Secondary | ICD-10-CM | POA: Diagnosis not present

## 2023-02-16 DIAGNOSIS — B351 Tinea unguium: Secondary | ICD-10-CM

## 2023-02-16 DIAGNOSIS — M79676 Pain in unspecified toe(s): Secondary | ICD-10-CM

## 2023-02-16 NOTE — Progress Notes (Signed)
Presents today chief complaint of painful elongated toenails.  Objective: Toenails are long thick yellow dystrophic and mycotic no open lesions or wounds.  Assessment: Pain in limb secondary to onychomycosis.  Plan: Debridement of toenails 1 through 5 bilateral.

## 2023-04-18 ENCOUNTER — Telehealth: Payer: Self-pay

## 2023-04-18 NOTE — Telephone Encounter (Signed)
I called patient to update meaningful use information. She states she didn't remember she had an appointment and needs to reschedule because she has other appointments the same day. Next available appointment was 12/12 @ 1:15. Pt agreed to new date/time

## 2023-04-21 ENCOUNTER — Inpatient Hospital Stay: Payer: Medicare HMO | Admitting: Gynecologic Oncology

## 2023-06-08 ENCOUNTER — Encounter: Payer: Self-pay | Admitting: Gynecologic Oncology

## 2023-06-08 ENCOUNTER — Inpatient Hospital Stay: Attending: Gynecologic Oncology | Admitting: Gynecologic Oncology

## 2023-06-08 VITALS — BP 119/60 | HR 89 | Temp 99.4°F | Resp 20

## 2023-06-08 DIAGNOSIS — C52 Malignant neoplasm of vagina: Secondary | ICD-10-CM | POA: Diagnosis present

## 2023-06-08 DIAGNOSIS — Z923 Personal history of irradiation: Secondary | ICD-10-CM | POA: Diagnosis not present

## 2023-06-08 NOTE — Patient Instructions (Signed)
It was good to see you today.  We will have a phone visit in 3 months.  Please do not hesitate to reach out to my office if you need anything before then.

## 2023-06-08 NOTE — Progress Notes (Signed)
Gynecologic Oncology Return Clinic Visit  06/08/23  Reason for Visit: surveillance  Treatment History: Oncology History Overview Note  PD-L1 CPS 50%   Vaginal cancer ([name])  09/16/2021 Initial Diagnosis   The patient initially presented on 3/24 with several weeks of postmenopausal bleeding.    09/17/2021 Imaging   Ct abdomen and pelvis New 6 cm ill-defined soft tissue mass centered in the region of the cervix and upper vagina, with possible involvement of the rectum and/or bladder. This is suspicious for cervical carcinoma. Recommend GYN consultation, and consider pelvic MRI without and with contrast for further evaluation.   Several tiny calcified uterine fibroids.   No evidence of metastatic disease.   Stable small benign left adrenal adenoma.   09/27/2021 Pathology Results   A. VAGINAL SIDEWALL, BIOPSY:  -  Invasive squamous cell carcinoma, keratinizing  -  See comment   10/02/2021 Initial Diagnosis   Cervical cancer ([name])   10/08/2021 PET scan   1. Marked hypermetabolism associated with the cervical mass consistent with cervical cancer. 2. Small hypermetabolic right pelvic sidewall node is suspicious for metastatic adenopathy. 3. No omental or peritoneal surface lesions are identified.  4. No findings for metastatic disease involving the chest or bony structures. 5. Diffuse hypermetabolism in the thyroid gland likely reflecting thyroiditis. 6. Age related atherosclerotic disease.       10/14/2021 Cancer Staging   Staging form: Vagina, AJCC 8th Edition - Clinical stage from 10/14/2021: Stage III (cT2b, cN1, cM0) - Signed by Artis Delay, MD on 10/14/2021 Stage prefix: Initial diagnosis   11/17/2021 - 02/09/2022 Radiation Therapy   Site Technique Total Dose (Gy) Dose per Fx (Gy) Completed Fx Beam Energies  Pelvis: Pelvis IMRT 45/45 1.8 25/25 6X  Pelvis: Pelvis_Bst 3D 9/9 1.8 5/5 10X, 15X  Vagina: Pelvis_Bst_HDR HDR-brachy 24/24 6 4/4 Ir-192     12/23/2022 PET scan   NM PET  Image Restage (PS) Skull Base to Thigh (F-18 FDG)  Result Date: 12/22/2022 CLINICAL DATA:  Subsequent treatment strategy for vaginal cancer. Biopsy in 2023. EXAM: NUCLEAR MEDICINE PET SKULL BASE TO THIGH TECHNIQUE: 10.3 mCi F-18 FDG was injected intravenously. Full-ring PET imaging was performed from the skull base to thigh after the radiotracer. CT data was obtained and used for attenuation correction and anatomic localization. Fasting blood glucose: 73 mg/dl COMPARISON:  19/14/7829 abdominopelvic CT.  PET of 05/05/2022. FINDINGS: Mild limitations secondary to patient body habitus. Mediastinal blood pool activity: SUV max 3.1 Liver activity: SUV max NA NECK: No cervical nodal hypermetabolism. Again identified is diffuse mild thyroid hypermetabolism. Incidental CT findings: Left carotid atherosclerosis. No cervical adenopathy. CHEST: No pulmonary parenchymal or thoracic nodal hypermetabolism. Incidental CT findings: Mild cardiomegaly with small pericardial effusion, similar. Aortic and coronary artery calcification. ABDOMEN/PELVIS: No abdominopelvic nodal hypermetabolism. The eccentric left lesion at the junction of the upper vagina and cervix is slightly more distinct today. Measures a S.U.V. max of 8.5 on approximately 169/4 versus a S.U.V. max of 5.7 on the prior. No extension into the rectum. Incidental CT findings: Normal adrenal glands. No renal calculi or hydronephrosis. Aortic atherosclerosis. Prior ventral abdominopelvic wall hernia repair. Uterine fibroids. Pelvic floor laxity. Presumably radiation induced low pelvic edema. Interpolar right renal 1.7 cm hypoattenuating lesion is similar and nonspecific on 114/4. SKELETON: Diffuse low-level marrow hypermetabolism is most likely related to stimulation by chemotherapy. No suspicious focal abnormality. Incidental CT findings: None. IMPRESSION: 1. Mild progression of vaginal/cervical primary as evidenced by mild increase in hypermetabolism. 2. No evidence of  tracer avid  metastatic disease. 3. Mild thyroid hypermetabolism can be seen with thyroiditis. Consider correlation with laboratory values. 4. Incidental findings, including: Uterine fibroids. Small pericardial effusion. Coronary artery atherosclerosis. Aortic Atherosclerosis (ICD10-I70.0). Electronically Signed   By: Jeronimo Greaves M.D.   On: 12/22/2022 15:56        Interval History: Endorses increased dizziness, balance issues, feeling unsteady.  Is using a walker now all of the time. Denies any vaginal bleeding or pelvic pain.  Reports regular bowel function with daily MiraLAX.  Denies any urinary symptoms. Since her last visit, has enrolled on hospice. Energy level is good, endorses significant decrease in appetite.  Past Medical/Surgical History: Past Medical History:  Diagnosis Date   Arthritis    knee and back pain   Cervical cancer ([name]) 10/02/2021   Diabetes mellitus ([name]) 06/27/2016   History of radiation therapy    Vagina- 11/17/21-02/09/22-Dr. Antony Blackbird   Hypertension     Past Surgical History:  Procedure Laterality Date   HERNIA REPAIR  2003   repair with mesh   HYSTERECTOMY ABDOMINAL WITH SALPINGO-OOPHORECTOMY  1960   doesn't remember why, doesn't remember being on HRT (never had menopausal symptoms)    Family History  Problem Relation Age of Onset   Diabetes Mother    Diabetes Father    Breast cancer Sister    Diabetes Sister    Cancer Sister        thinks may have had colon or stomach cancer   Prostate cancer Brother    Diabetes Brother    Cancer Brother        Liver cancer   CAD Neg Hx    Colon cancer Neg Hx    Ovarian cancer Neg Hx    Endometrial cancer Neg Hx    Pancreatic cancer Neg Hx     Social History   Socioeconomic History   Marital status: Divorced    Spouse name: Not on file   Number of children: 2   Years of education: Not on file   Highest education level: Not on file  Occupational History   Not on file  Tobacco Use   Smoking  status: Never   Smokeless tobacco: Never  Vaping Use   Vaping status: Never Used  Substance and Sexual Activity   Alcohol use: No   Drug use: No   Sexual activity: Not Currently  Other Topics Concern   Not on file  Social History Narrative   Not on file   Social Drivers of Health   Financial Resource Strain: Not on file  Food Insecurity: Not on file  Transportation Needs: Not on file  Physical Activity: Not on file  Stress: Not on file  Social Connections: Not on file    Current Medications:  Current Outpatient Medications:    acetaminophen (TYLENOL) 325 MG tablet, Take 650 mg by mouth as needed., Disp: , Rfl:    aspirin EC 81 MG tablet, Take 81 mg by mouth daily., Disp: , Rfl:    oxyCODONE (OXY IR/ROXICODONE) 5 MG immediate release tablet, Take 5 mg by mouth every 4 (four) hours as needed for severe pain (pain score 7-10)., Disp: , Rfl:    polyethylene glycol (MIRALAX / GLYCOLAX) 17 g packet, Take 17 g by mouth daily., Disp: , Rfl:    vitamin B-12 (CYANOCOBALAMIN) 100 MCG tablet, Take 100 mcg by mouth daily., Disp: , Rfl:   Review of Systems: + Neck lump, dizziness Denies appetite changes, fevers, chills, fatigue, unexplained weight changes. Denies hearing loss,  mouth sores, ringing in ears or voice changes. Denies cough or wheezing.  Denies shortness of breath. Denies chest pain or palpitations. Denies leg swelling. Denies abdominal distention, pain, blood in stools, constipation, diarrhea, nausea, vomiting, or early satiety. Denies pain with intercourse, dysuria, frequency, hematuria or incontinence. Denies hot flashes, pelvic pain, vaginal bleeding or vaginal discharge.   Denies joint pain, back pain or muscle pain/cramps. Denies itching, rash, or wounds. Denies headaches, numbness or seizures. Denies swollen lymph nodes or glands, denies easy bruising or bleeding. Denies anxiety, depression, confusion, or decreased concentration.  Physical Exam: BP 119/60 (BP  Location: Left Arm, Patient Position: Sitting)   Pulse 89   Temp 99.4 F (37.4 C) (Oral)   Resp 20   SpO2 98%  General: Alert, oriented, no acute distress.  Muscle wasting. HEENT: Normocephalic, atraumatic, sclera anicteric. Chest: Clear to auscultation bilaterally.  No wheezes or rhonchi. Cardiovascular: Regular rate and rhythm, no murmurs.  Laboratory & Radiologic Studies: None new  Assessment & Plan: Megan Ryan is a 87 y.o. 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  Good response on post-treatment imaging. PET in 11/2022 concerning for local progression vs recurrence. PDL1 50%. Patient met with Dr. Bertis Ruddy to discussed treatment options including single agent IO. Patient asymptomatic, declined treatment.   Patient presents with her sister today as her daughter is feeling out of the weather.  She remains relatively asymptomatic with regards to her progressive vaginal cancer.  Her preference was to avoid pelvic exam today.  We reviewed most recent PET scan as she thought that at her last visit with Dr. Bertis Ruddy she was told there was no evidence of cancer.  I reviewed that the reason we had her see Dr. Bertis Ruddy was to talk about systemic therapy given imaging findings and physical exam findings concerning for cancer recurrence versus progression.  She continues to decline desire to receive any systemic therapy.  She is now enrolled in hospice.  She is just in continuing to follow with me.  I offered that visits could be done virtually.  Her preference is to have her next visit be by phone.  22 minutes of total time was spent for this patient encounter, including preparation, face-to-face counseling with the patient and coordination of care, and documentation of the encounter.  Eugene Garnet, MD  Division of Gynecologic Oncology  Department of Obstetrics and Gynecology  [facility]

## 2023-06-13 ENCOUNTER — Ambulatory Visit: Payer: Medicare HMO | Admitting: Podiatry

## 2023-06-13 ENCOUNTER — Encounter: Payer: Self-pay | Admitting: Podiatry

## 2023-06-13 DIAGNOSIS — E119 Type 2 diabetes mellitus without complications: Secondary | ICD-10-CM | POA: Diagnosis not present

## 2023-06-13 DIAGNOSIS — M79676 Pain in unspecified toe(s): Secondary | ICD-10-CM

## 2023-06-13 DIAGNOSIS — B351 Tinea unguium: Secondary | ICD-10-CM

## 2023-06-22 NOTE — Progress Notes (Signed)
  Subjective:  Patient ID: Megan Ryan, female    DOB: 1935/11/27,  MRN: [id number]  Megan Ryan presents to clinic today for for annual diabetic foot examination and painful elongated mycotic toenails 1-5 bilaterally which are tender when wearing enclosed shoe gear. Pain is relieved with periodic professional debridement.  Chief Complaint  Patient presents with   Nail Problem    Patient states the last time she saw her pcp was in October    New problem(s): None.   PCP is Fleet Contras, MD.  No Known Allergies  Review of Systems: Negative except as noted in the HPI.  Objective: No changes noted in today's physical examination. There were no vitals filed for this visit. Megan Ryan is a pleasant 87 y.o. female in NAD. AAO x 3.   Diabetic foot exam was performed with the following findings:   No deformities, ulcerations, or other skin breakdown Normal sensation of 10g monofilament Intact posterior tibialis and dorsalis pedis pulses Pedal skin is warm and supple b/l LE. No open wounds b/l LE. No interdigital macerations noted b/l LE. Toenails 1-5 b/l elongated, discolored, dystrophic, thickened, crumbly with subungual debris and tenderness to dorsal palpation.      Assessment/Plan: 1. Pain due to onychomycosis of toenail   2. Type 2 diabetes mellitus without complication, without long-term current use of insulin ([name])   3. Encounter for diabetic foot exam [facility])     Patient was evaluated and treated. All patient's and/or POA's questions/concerns addressed on today's visit. Toenails 1-5 debrided in length and girth without incident. Continue soft, supportive shoe gear daily. Report any pedal injuries to medical professional. Call office if there are any questions/concerns. -Continue foot and shoe inspections daily. Monitor blood glucose per PCP/Endocrinologist's recommendations. -Patient/POA to call should there be question/concern in the interim.   Return in about 3  months (around 09/11/2023).  Megan Ryan, DPM      [facility] LOCATION: [address] [place] [address]                   Office [phone]   [facility] LOCATION: [address] [place], [place] [address] Office [phone]

## 2023-09-20 ENCOUNTER — Encounter: Payer: Self-pay | Admitting: Gynecologic Oncology

## 2023-09-20 ENCOUNTER — Inpatient Hospital Stay: Payer: Medicare HMO | Attending: Gynecologic Oncology | Admitting: Gynecologic Oncology

## 2023-09-20 DIAGNOSIS — C52 Malignant neoplasm of vagina: Secondary | ICD-10-CM

## 2023-09-20 NOTE — Progress Notes (Signed)
 Gynecologic Oncology Telehealth Note: Gyn-Onc  I connected with Megan Ryan on 09/20/23 at  6:00 PM EDT by telephone and verified that I am speaking with the correct person using two identifiers.  I discussed the limitations, risks, security and privacy concerns of performing an evaluation and management service by telemedicine and the availability of in-person appointments. I also discussed with the patient that there may be a patient responsible charge related to this service. The patient expressed understanding and agreed to proceed.  Other persons participating in the visit and their role in the encounter: daughter, Elease Hashimoto.  Patient's location: home Provider's location: [facility]  Reason for Visit: follow-up  Treatment History: Oncology History Overview Note  PD-L1 CPS 50%   Vaginal cancer ([name])  09/16/2021 Initial Diagnosis   The patient initially presented on 3/24 with several weeks of postmenopausal bleeding.    09/17/2021 Imaging   Ct abdomen and pelvis New 6 cm ill-defined soft tissue mass centered in the region of the cervix and upper vagina, with possible involvement of the rectum and/or bladder. This is suspicious for cervical carcinoma. Recommend GYN consultation, and consider pelvic MRI without and with contrast for further evaluation.   Several tiny calcified uterine fibroids.   No evidence of metastatic disease.   Stable small benign left adrenal adenoma.   09/27/2021 Pathology Results   A. VAGINAL SIDEWALL, BIOPSY:  -  Invasive squamous cell carcinoma, keratinizing  -  See comment   10/02/2021 Initial Diagnosis   Cervical cancer ([name])   10/08/2021 PET scan   1. Marked hypermetabolism associated with the cervical mass consistent with cervical cancer. 2. Small hypermetabolic right pelvic sidewall node is suspicious for metastatic adenopathy. 3. No omental or peritoneal surface lesions are identified.  4. No findings for metastatic disease involving the chest or  bony structures. 5. Diffuse hypermetabolism in the thyroid gland likely reflecting thyroiditis. 6. Age related atherosclerotic disease.       10/14/2021 Cancer Staging   Staging form: Vagina, AJCC 8th Edition - Clinical stage from 10/14/2021: Stage III (cT2b, cN1, cM0) - Signed by Artis Delay, MD on 10/14/2021 Stage prefix: Initial diagnosis   11/17/2021 - 02/09/2022 Radiation Therapy   Site Technique Total Dose (Gy) Dose per Fx (Gy) Completed Fx Beam Energies  Pelvis: Pelvis IMRT 45/45 1.8 25/25 6X  Pelvis: Pelvis_Bst 3D 9/9 1.8 5/5 10X, 15X  Vagina: Pelvis_Bst_HDR HDR-brachy 24/24 6 4/4 Ir-192     12/23/2022 PET scan   NM PET Image Restage (PS) Skull Base to Thigh (F-18 FDG)  Result Date: 12/22/2022 CLINICAL DATA:  Subsequent treatment strategy for vaginal cancer. Biopsy in 2023. EXAM: NUCLEAR MEDICINE PET SKULL BASE TO THIGH TECHNIQUE: 10.3 mCi F-18 FDG was injected intravenously. Full-ring PET imaging was performed from the skull base to thigh after the radiotracer. CT data was obtained and used for attenuation correction and anatomic localization. Fasting blood glucose: 73 mg/dl COMPARISON:  91/47/8295 abdominopelvic CT.  PET of 05/05/2022. FINDINGS: Mild limitations secondary to patient body habitus. Mediastinal blood pool activity: SUV max 3.1 Liver activity: SUV max NA NECK: No cervical nodal hypermetabolism. Again identified is diffuse mild thyroid hypermetabolism. Incidental CT findings: Left carotid atherosclerosis. No cervical adenopathy. CHEST: No pulmonary parenchymal or thoracic nodal hypermetabolism. Incidental CT findings: Mild cardiomegaly with small pericardial effusion, similar. Aortic and coronary artery calcification. ABDOMEN/PELVIS: No abdominopelvic nodal hypermetabolism. The eccentric left lesion at the junction of the upper vagina and cervix is slightly more distinct today. Measures a S.U.V. max of 8.5 on approximately  169/4 versus a S.U.V. max of 5.7 on the prior. No  extension into the rectum. Incidental CT findings: Normal adrenal glands. No renal calculi or hydronephrosis. Aortic atherosclerosis. Prior ventral abdominopelvic wall hernia repair. Uterine fibroids. Pelvic floor laxity. Presumably radiation induced low pelvic edema. Interpolar right renal 1.7 cm hypoattenuating lesion is similar and nonspecific on 114/4. SKELETON: Diffuse low-level marrow hypermetabolism is most likely related to stimulation by chemotherapy. No suspicious focal abnormality. Incidental CT findings: None. IMPRESSION: 1. Mild progression of vaginal/cervical primary as evidenced by mild increase in hypermetabolism. 2. No evidence of tracer avid metastatic disease. 3. Mild thyroid hypermetabolism can be seen with thyroiditis. Consider correlation with laboratory values. 4. Incidental findings, including: Uterine fibroids. Small pericardial effusion. Coronary artery atherosclerosis. Aortic Atherosclerosis (ICD10-I70.0). Electronically Signed   By: Jeronimo Greaves M.D.   On: 12/22/2022 15:56        Interval History: Lost a lot of weight, everything tastes bitter so not wanting to eat much. Denies vaginal bleeding. Bowels are moving well, has told daughter that her stool is black.  Past Medical/Surgical History: Past Medical History:  Diagnosis Date   Arthritis    knee and back pain   Cervical cancer ([name]) 10/02/2021   Diabetes mellitus ([name]) 06/27/2016   History of radiation therapy    Vagina- 11/17/21-02/09/22-Dr. Antony Blackbird   Hypertension     Past Surgical History:  Procedure Laterality Date   HERNIA REPAIR  2003   repair with mesh   HYSTERECTOMY ABDOMINAL WITH SALPINGO-OOPHORECTOMY  1960   doesn't remember why, doesn't remember being on HRT (never had menopausal symptoms)    Family History  Problem Relation Age of Onset   Diabetes Mother    Diabetes Father    Breast cancer Sister    Diabetes Sister    Cancer Sister        thinks may have had colon or stomach cancer    Prostate cancer Brother    Diabetes Brother    Cancer Brother        Liver cancer   CAD Neg Hx    Colon cancer Neg Hx    Ovarian cancer Neg Hx    Endometrial cancer Neg Hx    Pancreatic cancer Neg Hx     Social History   Socioeconomic History   Marital status: Divorced    Spouse name: Not on file   Number of children: 2   Years of education: Not on file   Highest education level: Not on file  Occupational History   Not on file  Tobacco Use   Smoking status: Never   Smokeless tobacco: Never  Vaping Use   Vaping status: Never Used  Substance and Sexual Activity   Alcohol use: No   Drug use: No   Sexual activity: Not Currently  Other Topics Concern   Not on file  Social History Narrative   Not on file   Social Drivers of Health   Financial Resource Strain: Not on file  Food Insecurity: Not on file  Transportation Needs: Not on file  Physical Activity: Not on file  Stress: Not on file  Social Connections: Not on file    Current Medications:  Current Outpatient Medications:    acetaminophen (TYLENOL) 325 MG tablet, Take 650 mg by mouth as needed., Disp: , Rfl:    aspirin EC 81 MG tablet, Take 81 mg by mouth daily., Disp: , Rfl:    oxyCODONE (OXY IR/ROXICODONE) 5 MG immediate release tablet, Take 5 mg by  mouth every 4 (four) hours as needed for severe pain (pain score 7-10)., Disp: , Rfl:    polyethylene glycol (MIRALAX / GLYCOLAX) 17 g packet, Take 17 g by mouth daily., Disp: , Rfl:    vitamin B-12 (CYANOCOBALAMIN) 100 MCG tablet, Take 100 mcg by mouth daily., Disp: , Rfl:   Review of Symptoms: Pertinent positives as per HPI.  Physical Exam: Deferred given limitations of phone visit.  Laboratory & Radiologic Studies: None new  Assessment & Plan: HILLARIE HARRIGAN is a 88 y.o. 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  Good response on post-treatment imaging. PET in 11/2022 concerning for local  progression vs recurrence. PDL1 50%. Patient met with Dr. Bertis Ruddy to discussed treatment options including single agent IO. Patient asymptomatic, declined treatment.   Spoke with patient's daughter. She continues to lose weight. Discussed that it sounds like she may have a GI bleed (given black stools). Discussed work-up for this including CBC, guaiac test, and possible GI referral. Patient's daughter would like to proceed with work-up. I will have my office call her tomorrow to schedule time for the patient to come in for lab visit, to pick up specimen container for stool sample.  I discussed the assessment and treatment plan with the patient. The patient was provided with an opportunity to ask questions and all were answered. The patient agreed with the plan and demonstrated an understanding of the instructions.   The patient was advised to call back or see an in-person evaluation if the symptoms worsen or if the condition fails to improve as anticipated.   8 minutes of total time was spent for this patient encounter, including preparation, phone counseling with the patient and coordination of care, and documentation of the encounter.   Eugene Garnet, MD  Division of Gynecologic Oncology  Department of Obstetrics and Gynecology  [facility]

## 2023-09-21 ENCOUNTER — Telehealth: Payer: Self-pay

## 2023-09-21 ENCOUNTER — Other Ambulatory Visit: Payer: Self-pay | Admitting: Gynecologic Oncology

## 2023-09-21 DIAGNOSIS — K921 Melena: Secondary | ICD-10-CM

## 2023-09-21 NOTE — Telephone Encounter (Signed)
-----   Message from Nurse Cheron Schaumann W sent at 09/21/2023  8:58 AM EDT -----  ----- Message ----- From: Carver Fila, MD Sent: 09/20/2023   7:00 PM EDT To: Doylene Bode, NP; Geraldine Solar Wheat, NT; #  Hi all,  This patient needs lab appt for CBC and stool sample (will probably need to pick up container for stool and have daughter bring back for guaiac). If blood in stool, daughter would like a GI referral. Could we also get her scheduled for phone follow-up in 3 months? Thank you! Megan Ryan

## 2023-09-21 NOTE — Telephone Encounter (Signed)
 Per Dr.Tucker I tried to reach out to Elease Hashimoto, pt's daughter. I was unable to leave a voicemail d/t voicemail is full. I will try again later.

## 2023-09-21 NOTE — Telephone Encounter (Signed)
 I spoke to Elease Hashimoto, Ms.Barnetts daughter, she is aware of message from Dr.Tucker. Elease Hashimoto states she will call office tomorrow to pick up Guiac kit. She is aware to call before bringing it back so Ms.Gero can get a CBC drawn at the lab.   A 3 month phone visit has also been scheduled with Dr.Tucker on 6/25 @ 6:00. Elease Hashimoto agreed to all instructions and will call if any questions

## 2023-09-22 ENCOUNTER — Ambulatory Visit: Payer: Medicare HMO | Admitting: Gynecologic Oncology

## 2023-09-22 NOTE — Telephone Encounter (Signed)
 Megan Ryan called office stating she can not come today to pick up Hemocult kit for her mom, She will come on Monday 3/31.

## 2023-09-25 NOTE — Telephone Encounter (Signed)
 Elease Hashimoto, pt's daughter, picked up the Hemocult card. Instructions explained. She voiced an understanding and will call once specimen has been obtained will be dropped off, pt also to get a CBC drawn at that time.

## 2023-09-28 NOTE — Telephone Encounter (Signed)
 LVM for Megan Ryan to call office regarding update on Ms.Tremblay obtaining the stool sample for hemoccult cards.

## 2023-10-02 NOTE — Telephone Encounter (Signed)
 Unable to reach to [place], pt's daughter,(voicemail full) regarding status of collecting a stool sample and bringing to lab.

## 2023-10-03 NOTE — Telephone Encounter (Signed)
 I spoke to Elease Hashimoto, she states her mom had a BM over the weekend but nothing so far this week. She will make sure she gets a sample at next [facility] and call so she can schedule a lab appointment for a CBC.

## 2023-10-05 NOTE — Telephone Encounter (Signed)
 I spoke to Elease Hashimoto, pt's daughter, this morning, She states she is still trying to get a stool sample. Reports Ms.Trimarco is having loose stools and it's hard to get a sample. Stools are still dark in color. Advised to keep the urine hat in the toilet, to catch the stool. She states she has told her mom that but she hasn't done it yet. Advised to make sure pt is staying hydrated  Elease Hashimoto reports she is really working on getting the sample and will call once collected. Aware pt still needs to get the CBC drawn. Elease Hashimoto voiced an understanding and was very thankful to me for continuing to check in.   Update sent to Dr.Tucker

## 2023-10-11 ENCOUNTER — Ambulatory Visit (INDEPENDENT_AMBULATORY_CARE_PROVIDER_SITE_OTHER): Payer: Medicare HMO | Admitting: Podiatry

## 2023-10-11 DIAGNOSIS — Z91198 Patient's noncompliance with other medical treatment and regimen for other reason: Secondary | ICD-10-CM

## 2023-10-12 NOTE — Progress Notes (Signed)
 1. Failure to attend appointment with reason given    Patient canceled appointment.

## 2023-10-23 ENCOUNTER — Encounter (HOSPITAL_COMMUNITY): Payer: Self-pay

## 2023-10-23 ENCOUNTER — Inpatient Hospital Stay (HOSPITAL_COMMUNITY)
Admission: EM | Admit: 2023-10-23 | Discharge: 2023-10-26 | DRG: 393 | Disposition: A | Discharge status: Hospice | Attending: Family Medicine | Admitting: Family Medicine

## 2023-10-23 ENCOUNTER — Other Ambulatory Visit: Payer: Self-pay

## 2023-10-23 ENCOUNTER — Emergency Department (HOSPITAL_COMMUNITY)

## 2023-10-23 DIAGNOSIS — N3000 Acute cystitis without hematuria: Secondary | ICD-10-CM | POA: Diagnosis not present

## 2023-10-23 DIAGNOSIS — Z90710 Acquired absence of both cervix and uterus: Secondary | ICD-10-CM

## 2023-10-23 DIAGNOSIS — E440 Moderate protein-calorie malnutrition: Secondary | ICD-10-CM | POA: Diagnosis present

## 2023-10-23 DIAGNOSIS — Z8541 Personal history of malignant neoplasm of cervix uteri: Secondary | ICD-10-CM

## 2023-10-23 DIAGNOSIS — I700 Atherosclerosis of aorta: Secondary | ICD-10-CM | POA: Diagnosis present

## 2023-10-23 DIAGNOSIS — N823 Fistula of vagina to large intestine: Secondary | ICD-10-CM | POA: Diagnosis not present

## 2023-10-23 DIAGNOSIS — Z681 Body mass index (BMI) 19 or less, adult: Secondary | ICD-10-CM

## 2023-10-23 DIAGNOSIS — G252 Other specified forms of tremor: Secondary | ICD-10-CM | POA: Diagnosis present

## 2023-10-23 DIAGNOSIS — I3139 Other pericardial effusion (noninflammatory): Secondary | ICD-10-CM | POA: Diagnosis present

## 2023-10-23 DIAGNOSIS — I10 Essential (primary) hypertension: Secondary | ICD-10-CM | POA: Diagnosis present

## 2023-10-23 DIAGNOSIS — Z8544 Personal history of malignant neoplasm of other female genital organs: Secondary | ICD-10-CM

## 2023-10-23 DIAGNOSIS — E119 Type 2 diabetes mellitus without complications: Secondary | ICD-10-CM | POA: Diagnosis present

## 2023-10-23 DIAGNOSIS — Z7984 Long term (current) use of oral hypoglycemic drugs: Secondary | ICD-10-CM

## 2023-10-23 DIAGNOSIS — N321 Vesicointestinal fistula: Secondary | ICD-10-CM | POA: Diagnosis not present

## 2023-10-23 DIAGNOSIS — Z923 Personal history of irradiation: Secondary | ICD-10-CM

## 2023-10-23 DIAGNOSIS — Z800 Family history of malignant neoplasm of digestive organs: Secondary | ICD-10-CM

## 2023-10-23 DIAGNOSIS — D649 Anemia, unspecified: Secondary | ICD-10-CM | POA: Insufficient documentation

## 2023-10-23 DIAGNOSIS — K529 Noninfective gastroenteritis and colitis, unspecified: Secondary | ICD-10-CM

## 2023-10-23 DIAGNOSIS — Z515 Encounter for palliative care: Secondary | ICD-10-CM

## 2023-10-23 DIAGNOSIS — Z7982 Long term (current) use of aspirin: Secondary | ICD-10-CM

## 2023-10-23 DIAGNOSIS — D638 Anemia in other chronic diseases classified elsewhere: Secondary | ICD-10-CM | POA: Diagnosis present

## 2023-10-23 DIAGNOSIS — E559 Vitamin D deficiency, unspecified: Secondary | ICD-10-CM | POA: Diagnosis present

## 2023-10-23 DIAGNOSIS — Z8042 Family history of malignant neoplasm of prostate: Secondary | ICD-10-CM

## 2023-10-23 DIAGNOSIS — E43 Unspecified severe protein-calorie malnutrition: Secondary | ICD-10-CM | POA: Diagnosis present

## 2023-10-23 DIAGNOSIS — Z86718 Personal history of other venous thrombosis and embolism: Secondary | ICD-10-CM

## 2023-10-23 DIAGNOSIS — R5383 Other fatigue: Secondary | ICD-10-CM | POA: Diagnosis not present

## 2023-10-23 DIAGNOSIS — I110 Hypertensive heart disease with heart failure: Secondary | ICD-10-CM | POA: Diagnosis present

## 2023-10-23 DIAGNOSIS — R64 Cachexia: Secondary | ICD-10-CM | POA: Diagnosis present

## 2023-10-23 DIAGNOSIS — Z803 Family history of malignant neoplasm of breast: Secondary | ICD-10-CM

## 2023-10-23 DIAGNOSIS — B9620 Unspecified Escherichia coli [E. coli] as the cause of diseases classified elsewhere: Secondary | ICD-10-CM | POA: Diagnosis present

## 2023-10-23 DIAGNOSIS — M1990 Unspecified osteoarthritis, unspecified site: Secondary | ICD-10-CM | POA: Diagnosis present

## 2023-10-23 DIAGNOSIS — Z66 Do not resuscitate: Secondary | ICD-10-CM | POA: Diagnosis present

## 2023-10-23 DIAGNOSIS — Z833 Family history of diabetes mellitus: Secondary | ICD-10-CM

## 2023-10-23 DIAGNOSIS — N136 Pyonephrosis: Secondary | ICD-10-CM | POA: Diagnosis present

## 2023-10-23 DIAGNOSIS — C530 Malignant neoplasm of endocervix: Principal | ICD-10-CM

## 2023-10-23 DIAGNOSIS — I5032 Chronic diastolic (congestive) heart failure: Secondary | ICD-10-CM | POA: Diagnosis present

## 2023-10-23 DIAGNOSIS — N820 Vesicovaginal fistula: Secondary | ICD-10-CM

## 2023-10-23 DIAGNOSIS — C52 Malignant neoplasm of vagina: Secondary | ICD-10-CM | POA: Diagnosis present

## 2023-10-23 LAB — URINALYSIS, W/ REFLEX TO CULTURE (INFECTION SUSPECTED)
Bilirubin Urine: NEGATIVE
Glucose, UA: NEGATIVE mg/dL
Ketones, ur: 5 mg/dL — AB
Nitrite: NEGATIVE
Protein, ur: 100 mg/dL — AB
Specific Gravity, Urine: 1.02 (ref 1.005–1.030)
WBC, UA: >50 WBC/hpf (ref 0–5)
pH: 7 (ref 5.0–8.0)

## 2023-10-23 LAB — COMPREHENSIVE METABOLIC PANEL WITH GFR
ALT: 15 U/L (ref 0–44)
AST: 18 U/L (ref 15–41)
Albumin: 2.3 g/dL — ABNORMAL LOW (ref 3.5–5.0)
Alkaline Phosphatase: 78 U/L (ref 38–126)
Anion gap: 13 (ref 5–15)
BUN: 32 mg/dL — ABNORMAL HIGH (ref 8–23)
CO2: 23 mmol/L (ref 22–32)
Calcium: 9.6 mg/dL (ref 8.9–10.3)
Chloride: 107 mmol/L (ref 98–111)
Creatinine, Ser: 0.98 mg/dL (ref 0.44–1.00)
GFR, Estimated: 56 mL/min — ABNORMAL LOW (ref >60)
Glucose, Bld: 90 mg/dL (ref 70–99)
Potassium: 3.7 mmol/L (ref 3.5–5.1)
Sodium: 143 mmol/L (ref 135–145)
Total Bilirubin: 0.8 mg/dL (ref 0.0–1.2)
Total Protein: 6.7 g/dL (ref 6.5–8.1)

## 2023-10-23 LAB — CBC WITH DIFFERENTIAL/PLATELET
Abs Immature Granulocytes: 0.1 10*3/uL — ABNORMAL HIGH (ref 0.00–0.07)
Basophils Absolute: 0 10*3/uL (ref 0.0–0.1)
Basophils Relative: 0 %
Eosinophils Absolute: 0 10*3/uL (ref 0.0–0.5)
Eosinophils Relative: 0 %
HCT: 32.3 % — ABNORMAL LOW (ref 36.0–46.0)
Hemoglobin: 9.8 g/dL — ABNORMAL LOW (ref 12.0–15.0)
Immature Granulocytes: 1 %
Lymphocytes Relative: 4 %
Lymphs Abs: 0.7 10*3/uL (ref 0.7–4.0)
MCH: 30.3 pg (ref 26.0–34.0)
MCHC: 30.3 g/dL (ref 30.0–36.0)
MCV: 100 fL (ref 80.0–100.0)
Monocytes Absolute: 0.6 10*3/uL (ref 0.1–1.0)
Monocytes Relative: 4 %
Neutro Abs: 14.8 10*3/uL — ABNORMAL HIGH (ref 1.7–7.7)
Neutrophils Relative %: 91 %
Platelets: 383 10*3/uL (ref 150–400)
RBC: 3.23 MIL/uL — ABNORMAL LOW (ref 3.87–5.11)
RDW: 15 % (ref 11.5–15.5)
WBC: 16.2 10*3/uL — ABNORMAL HIGH (ref 4.0–10.5)
nRBC: 0 % (ref 0.0–0.2)

## 2023-10-23 LAB — C DIFFICILE QUICK SCREEN W PCR REFLEX
C Diff antigen: NEGATIVE
C Diff interpretation: NOT DETECTED
C Diff toxin: NEGATIVE

## 2023-10-23 MED ORDER — SODIUM CHLORIDE 0.9 % IV SOLN
1.0000 g | Freq: Once | INTRAVENOUS | Status: AC
  Administered 2023-10-23: 1 g via INTRAVENOUS
  Filled 2023-10-23: qty 10

## 2023-10-23 MED ORDER — ONDANSETRON HCL 4 MG/2ML IJ SOLN
4.0000 mg | Freq: Once | INTRAMUSCULAR | Status: AC
  Administered 2023-10-23: 4 mg via INTRAVENOUS
  Filled 2023-10-23: qty 2

## 2023-10-23 MED ORDER — LACTATED RINGERS IV BOLUS
1000.0000 mL | Freq: Once | INTRAVENOUS | Status: AC
  Administered 2023-10-23: 1000 mL via INTRAVENOUS

## 2023-10-23 MED ORDER — PSYLLIUM 95 % PO PACK: 1.0000 | PACK | Freq: Every day | ORAL | Status: DC

## 2023-10-23 NOTE — ED Provider Notes (Signed)
 [place] EMERGENCY DEPARTMENT AT [facility] Provider Note  CSN: [id number] Arrival date & time: 10/23/23 1653  Chief Complaint(s) Fatigue  HPI Megan Ryan is a 88 y.o. female who is here today for 30 days of diarrhea.  Patient has a history of cervical cancer, currently in hospice.  She has stated that over the last 30 days she has been having persistent diarrhea, some crampy abdominal pain and has been losing weight.  Her appetite has been appropriate.  She has had dark concentrated urine.  She has not had fever.   Past Medical History Past Medical History:  Diagnosis Date   Arthritis    knee and back pain   Cervical cancer ([name]) 10/02/2021   Diabetes mellitus ([name]) 06/27/2016   History of radiation therapy    Vagina- 11/17/21-02/09/22-Dr. 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 Authorizing Provider  acetaminophen  (TYLENOL ) 325 MG tablet Take 650 mg by mouth as needed.    [provider]  aspirin  EC 81 MG tablet Take 81 mg by mouth daily.    [provider]  oxyCODONE  (OXY IR/ROXICODONE ) 5 MG immediate release tablet Take 5 mg by mouth every 4 (four) hours as needed for severe pain  (pain score 7-10).    [provider]  polyethylene glycol (MIRALAX  / GLYCOLAX ) 17 g packet Take 17 g by mouth daily.    [provider]  vitamin B-12 (CYANOCOBALAMIN) 100 MCG tablet Take 100 mcg by mouth daily.    [provider]                                                                                                                                    Past Surgical History Past Surgical History:  Procedure Laterality Date   HERNIA REPAIR  2003   repair with mesh   HYSTERECTOMY ABDOMINAL WITH SALPINGO-OOPHORECTOMY  1960   doesn't remember why, doesn't remember being on HRT (never had menopausal symptoms)   Family History Family History  Problem Relation Age of Onset   Diabetes Mother    Diabetes Father    Breast cancer Sister    Diabetes Sister    Cancer Sister  thinks may have had colon or stomach cancer   Prostate cancer Brother    Diabetes Brother    Cancer Brother        Liver cancer   CAD Neg Hx    Colon cancer Neg Hx    Ovarian cancer Neg Hx    Endometrial cancer Neg Hx    Pancreatic cancer Neg Hx     Social History Social History   Tobacco Use   Smoking status: Never   Smokeless tobacco: Never  Vaping Use   Vaping status: Never Used  Substance Use Topics   Alcohol use: No   Drug use: No   Allergies Patient has no known allergies.  Review of Systems Review of Systems  Physical Exam Vital Signs  I have reviewed the triage vital signs BP 116/85   Pulse 92   Temp 98 F (36.7 C) (Oral)   Resp 20   Ht 5\' 7"  (1.702 m)   Wt 45.4 kg   SpO2 100%   BMI 15.66 kg/m   Physical Exam Vitals reviewed.  HENT:     Head: Normocephalic.  Cardiovascular:     Rate and Rhythm: Normal rate.     Pulses: Normal pulses.  Pulmonary:     Effort: Pulmonary effort is normal.  Abdominal:     General: Abdomen is flat. There is no distension.     Palpations: Abdomen is soft.     Tenderness: There is no abdominal tenderness.   Musculoskeletal:        General: Normal range of motion.     ED Results and Treatments Labs (all labs ordered are listed, but only abnormal results are displayed) Labs Reviewed  COMPREHENSIVE METABOLIC PANEL WITH GFR - Abnormal; Notable for the following components:      Result Value   BUN 32 (*)    Albumin 2.3 (*)    GFR, Estimated 56 (*)    All other components within normal limits  CBC WITH DIFFERENTIAL/PLATELET - Abnormal; Notable for the following components:   WBC 16.2 (*)    RBC 3.23 (*)    Hemoglobin 9.8 (*)    HCT 32.3 (*)    Neutro Abs 14.8 (*)    Abs Immature Granulocytes 0.10 (*)    All other components within normal limits  URINALYSIS, W/ REFLEX TO CULTURE (INFECTION SUSPECTED) - Abnormal; Notable for the following components:   Color, Urine AMBER (*)    APPearance TURBID (*)    Hgb urine dipstick MODERATE (*)    Ketones, ur 5 (*)    Protein, ur 100 (*)    Leukocytes,Ua MODERATE (*)    Bacteria, UA MANY (*)    Non Squamous Epithelial 6-10 (*)    All other components within normal limits  C DIFFICILE QUICK SCREEN W PCR REFLEX    GASTROINTESTINAL PANEL BY PCR, STOOL (REPLACES STOOL CULTURE)  URINE CULTURE  Radiology CT ABDOMEN PELVIS WO CONTRAST Result Date: 10/23/2023 CLINICAL DATA:  Diarrhea for 30 days. On hospice for ovarian cancer. Dark foul-smelling urine. Weight loss over the past 2 months. EXAM: CT ABDOMEN AND PELVIS WITHOUT CONTRAST TECHNIQUE: Multidetector CT imaging of the abdomen and pelvis was performed following the standard protocol without IV contrast. RADIATION DOSE REDUCTION: This exam was performed according to the departmental dose-optimization program which includes automated exposure control, adjustment of the mA and/or kV according to patient size and/or use of iterative reconstruction technique. COMPARISON:  PET/CT  12/20/2022 and CT abdomen pelvis 11/10/2022 FINDINGS: Lower chest: Moderate pericardial effusion, increased from 11/10/2022. Hepatobiliary: Unremarkable noncontrast appearance of the liver. Biliary sludge. No biliary dilation. Pancreas: Unremarkable. Spleen: Unremarkable. Adrenals/Urinary Tract: Stable adrenal glands. There is new marked left hydroureteronephrosis. No radiopaque stone. Question soft tissue infiltration and urothelial thickening about the distal left ureter and UVJ (circa series 2/image 63). Diffuse wall thickening and perivesical stranding about the bladder greatest posteriorly. There is loss of the fat plane between the posterior bladder and vagina. Query vesicovaginal fistula. Gas within the anterior bladder. Stomach/Bowel: Irregular wall thickening about the rectum greater on the left (circa series 2/image 68). This is increased compared to 11/10/2022. There is adjacent perirectal fat stranding. No bowel obstruction. Stomach is within normal limits. Vascular/Lymphatic: No definite adenopathy noting limitations of noncontrast exam. Aortic atherosclerotic calcification. Reproductive: Calcifications in the uterine fundus likely represent fibroids. Fecalized debris in the vagina with colovaginal fistula (circa series 6/image 57). Other: No free intraperitoneal air. Free fluid in the pelvis. No abscess. Anterior abdominal hernia repair. Body wall edema. Musculoskeletal: No acute fracture. IMPRESSION: 1. 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 Associated infiltrative mass and/or inflammation about the rectum, vagina, and bladder. 2. New marked left hydroureteronephrosis. Limited evaluation without IV contrast however there is the suggestion of urothelial thickening and soft tissue infiltration about the left UVJ. No urinary calculi. 3. Moderate pericardial effusion, increased from 11/10/2022. 4. Aortic Atherosclerosis (ICD10-I70.0). Electronically Signed   By: Rozell Cornet M.D.   On: 10/23/2023 20:36    Pertinent labs & imaging results that were available during my care of the patient were reviewed by me and considered in my medical decision making (see MDM for details).  Medications Ordered in ED Medications  cefTRIAXone  (ROCEPHIN ) 1 g in sodium chloride  0.9 % 100 mL IVPB (has no administration in time range)  psyllium (HYDROCIL/METAMUCIL) 1 packet (has no administration in time range)  ondansetron  (ZOFRAN ) injection 4 mg (4 mg Intravenous Given 10/23/23 1802)  lactated ringers bolus 1,000 mL (1,000 mLs Intravenous New Bag/Given 10/23/23 2118)  lactated ringers bolus 1,000 mL (0 mLs Intravenous Stopped 10/23/23 2118)                                                                                                                                     Procedures Procedures  (including critical care time)  Medical Decision Making / ED Course   This patient presents to the ED for concern of dehydration, diarrhea, this involves an extensive number of treatment options, and is a complaint that carries with it a high risk of complications and morbidity.  The differential diagnosis includes dehydration, enteritis, C. difficile, acute kidney injury, carcinomatosis.  MDM: Patient on hospice with cervical cancer.  Her primary goal is to return home.  Will obtain a noncontrasted image of the patient's abdomen.  Will give IV fluids.  Patient is reporting some nausea, will give Zofran .  Will check urinalysis on the patient.  With the 1 month of diarrhea, will check a C. difficile on the patient.  I had a goals of care conversation with this patient to discuss different treatment modalities that she would be comfortable with.  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  I spoke with Dr. Daisey Dryer of [facility], who reviewed the patient's results.  We discussed the patient's long-term goals, and stated that patient's symptoms would  definitively be treated by something like a colectomy which did not seem to be in line with the patient's wishes.  They believed it was reasonable to bring the patient into the hospital for symptomatic treatment, and they would round on the patient tomorrow to discuss additional options going forward.  Patient wishes to be admitted at this time.   Additional history obtained: -Additional history obtained from  -External records from outside source obtained and reviewed including: Chart review including previous notes, labs, imaging, consultation notes   Lab Tests: -I ordered, reviewed, and interpreted labs.   The pertinent results include:   Labs Reviewed  COMPREHENSIVE METABOLIC PANEL WITH GFR - Abnormal; Notable for the following components:      Result Value   BUN 32 (*)    Albumin 2.3 (*)    GFR, Estimated 56 (*)    All other components within normal limits  CBC WITH DIFFERENTIAL/PLATELET - Abnormal; Notable for the following components:   WBC 16.2 (*)    RBC 3.23 (*)    Hemoglobin 9.8 (*)    HCT 32.3 (*)    Neutro Abs 14.8 (*)    Abs Immature Granulocytes 0.10 (*)    All other components within normal limits  URINALYSIS, W/ REFLEX TO CULTURE (INFECTION SUSPECTED) - Abnormal; Notable for the following components:   Color, Urine AMBER (*)    APPearance TURBID (*)    Hgb urine dipstick MODERATE (*)    Ketones, ur 5 (*)    Protein, ur 100 (*)    Leukocytes,Ua MODERATE (*)    Bacteria, UA MANY (*)    Non Squamous Epithelial 6-10 (*)    All other components within normal limits  C DIFFICILE QUICK SCREEN W PCR REFLEX    GASTROINTESTINAL PANEL BY PCR, STOOL (REPLACES STOOL CULTURE)  URINE CULTURE          Imaging Studies ordered: I ordered imaging studies including CT abdomen pelvis I independently visualized and interpreted imaging. I agree with the radiologist interpretation   Medicines ordered and prescription drug management: Meds ordered this encounter   Medications   ondansetron  (ZOFRAN ) injection 4 mg   lactated ringers bolus 1,000 mL   lactated ringers bolus 1,000 mL   cefTRIAXone  (ROCEPHIN ) 1 g in sodium chloride  0.9 % 100 mL IVPB    Antibiotic Indication::   UTI   psyllium (HYDROCIL/METAMUCIL) 1 packet    -I have reviewed the patients home medicines and have made  adjustments as needed   Cardiac Monitoring: The patient was maintained on a cardiac monitor.  I personally viewed and interpreted the cardiac monitored which showed an underlying rhythm of: Normal sinus rhythm  Social Determinants of Health:  Factors impacting patients care include: Multiple medical comorbidities including cervical cancer   Reevaluation: After the interventions noted above, I reevaluated the patient and found that they have :improved  Co morbidities that complicate the patient evaluation  Past Medical History:  Diagnosis Date   Arthritis    knee and back pain   Cervical cancer ([name]) 10/02/2021   Diabetes mellitus ([name]) 06/27/2016   History of radiation therapy    Vagina- 11/17/21-02/09/22-Dr. Retta Caster   Hypertension        Final Clinical Impression(s) / ED Diagnoses Final diagnoses:  Malignant neoplasm of endocervix ([name])  Rectovaginal fistula     @PCDICTATION @    Afton Horse T, DO 10/23/23 2236

## 2023-10-23 NOTE — ED Triage Notes (Signed)
 Pt BIB [facility] from home for diarrhea x30 days and general malaise. Pt reports weight loss ofver the past 2 months. Pt on hospice for ovarian CA. Pt also complaining of dark, foul smelling urine.  120/86 80HR 120 cbg

## 2023-10-23 NOTE — H&P (Incomplete)
 History and Physical    Megan Ryan BJY:[id number] DOB: 1935-08-18 DOA: 10/23/2023  PCP: Charle Congo, MD   Patient coming from: Home   Chief Complaint:  Chief Complaint  Patient presents with   Fatigue   ED TRIAGE note: t BIB [facility] from home for diarrhea x30 days and general malaise. Pt reports weight loss ofver the past 2 months. Pt on hospice for ovarian CA. Pt also complaining of dark, foul smelling urine.  120/86 80HR 120 cbg            HPI:  Megan Ryan is a 88 y.o. 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 Report that she is having ongoing diarrhea with associated crampy abdominal pain, losing weight, dark concentrated urine, foul-smelling urine.  Patient denies any fever and chill.   ED Course:  At presentation to ED patient is borderline hypotensive otherwise hemodynamically stable. C. difficile negative. Pending GI panel. CMP showing elevated BUN 3.2 otherwise unremarkable. CBC showing leukocytosis 16.2, stable H&H 9.8 and 32.  Normal platelet count. UA showing evidence of UTI.  Pending urine culture.  CT abdomen pelvis: 1. 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 Associated infiltrative mass and/or inflammation about the rectum, vagina, and bladder. 2. New marked left hydroureteronephrosis. Limited evaluation without IV contrast however there is the suggestion of urothelial thickening and soft tissue infiltration about the left UVJ. No urinary calculi. 3. Moderate pericardial effusion, increased from 11/10/2022. 4. Aortic Atherosclerosis (ICD10-I70.0).  ED physician consulted spoke  with gynecology/oncology Dr. Daisey Dryer, who reviewed the patient's results. Discussed the patient's long-term goals, and stated that patient's symptoms would definitively be treated by something like a colectomy which did not seem to be in line with the patient's wishes. They believed it was reasonable to bring the patient into the hospital for symptomatic treatment, and they would round on the patient tomorrow to discuss additional options going forward.    In the patient has been treated with ceftriaxone  1 g.  2 L of LR bolus. Hospitalist has been consulted for further evaluation management of rectovaginalvesico fistula which is contributing to chronic diarrhea and acute cystitis.   Significant labs in the ED: Lab Orders         C Difficile Quick Screen w PCR reflex         Gastrointestinal Panel by PCR , Stool         Urine Culture         Comprehensive metabolic panel         CBC with Differential         Urinalysis, w/ Reflex to Culture (Infection Suspected) -Urine, Clean Catch       Review of Systems:  ROS  Past Medical History:  Diagnosis Date   Arthritis    knee and back pain   Cervical cancer ([name]) 10/02/2021   Diabetes mellitus ([name]) 06/27/2016   History of radiation therapy    Vagina- 11/17/21-02/09/22-Dr. Retta Caster   Hypertension     Past Surgical History:  Procedure Laterality Date   HERNIA REPAIR  2003   repair with mesh   HYSTERECTOMY ABDOMINAL WITH SALPINGO-OOPHORECTOMY  1960   doesn't remember why, doesn't remember being on HRT (never had menopausal  symptoms)     reports that she has never smoked. She has never used smokeless tobacco. She reports that she does not drink alcohol and does not use drugs.  No Known Allergies  Family History  Problem Relation Age of Onset   Diabetes Mother    Diabetes Father    Breast cancer Sister    Diabetes Sister    Cancer Sister        thinks may have had colon or stomach cancer   Prostate cancer Brother     Diabetes Brother    Cancer Brother        Liver cancer   CAD Neg Hx    Colon cancer Neg Hx    Ovarian cancer Neg Hx    Endometrial cancer Neg Hx    Pancreatic cancer Neg Hx     Prior to Admission medications   Medication Sig Start Date End Date Taking? Authorizing Provider  acetaminophen  (TYLENOL ) 325 MG tablet Take 650 mg by mouth as needed.    [provider]  aspirin  EC 81 MG tablet Take 81 mg by mouth daily.    [provider]  oxyCODONE  (OXY IR/ROXICODONE ) 5 MG immediate release tablet Take 5 mg by mouth every 4 (four) hours as needed for severe pain (pain score 7-10).    [provider]  polyethylene glycol (MIRALAX  / GLYCOLAX ) 17 g packet Take 17 g by mouth daily.    [provider]  vitamin B-12 (CYANOCOBALAMIN) 100 MCG tablet Take 100 mcg by mouth daily.    [provider]     Physical Exam: Vitals:   10/23/23 2000 10/23/23 2030 10/23/23 2330 10/23/23 2352  BP: 116/85 117/69 (!) 120/103   Pulse:   73 73  Resp:   18   Temp:      TempSrc:      SpO2:   100% 100%  Weight:      Height:        Physical Exam   Labs on Admission: I have personally reviewed following labs and imaging studies  CBC: Recent Labs  Lab 10/23/23 1804  WBC 16.2*  NEUTROABS 14.8*  HGB 9.8*  HCT 32.3*  MCV 100.0  PLT 383   Basic Metabolic Panel: Recent Labs  Lab 10/23/23 1804  NA 143  K 3.7  CL 107  CO2 23  GLUCOSE 90  BUN 32*  CREATININE 0.98  CALCIUM 9.6   GFR: Estimated Creatinine Clearance: 29.5 mL/min (by C-G formula based on SCr of 0.98 mg/dL). Liver Function Tests: Recent Labs  Lab 10/23/23 1804  AST 18  ALT 15  ALKPHOS 78  BILITOT 0.8  PROT 6.7  ALBUMIN 2.3*   No results for input(s): "LIPASE", "AMYLASE" in the last 168 hours. No results for input(s): "AMMONIA" in the last 168 hours. Coagulation Profile: No results for input(s): "INR", "PROTIME" in the last 168 hours. Cardiac Enzymes: No results for input(s):  "CKTOTAL", "CKMB", "CKMBINDEX", "TROPONINI", "TROPONINIHS" in the last 168 hours. BNP (last 3 results) No results for input(s): "BNP" in the last 8760 hours. HbA1C: No results for input(s): "HGBA1C" in the last 72 hours. CBG: No results for input(s): "GLUCAP" in the last 168 hours. Lipid Profile: No results for input(s): "CHOL", "HDL", "LDLCALC", "TRIG", "CHOLHDL", "LDLDIRECT" in the last 72 hours. Thyroid  Function Tests: No results for input(s): "TSH", "T4TOTAL", "FREET4", "T3FREE", "THYROIDAB" in the last 72 hours. Anemia Panel: No results for input(s): "VITAMINB12", "FOLATE", "FERRITIN", "TIBC", "IRON", "RETICCTPCT" in the last 72 hours. Urine  analysis:    Component Value Date/Time   COLORURINE AMBER (A) 10/23/2023 1903   APPEARANCEUR TURBID (A) 10/23/2023 1903   LABSPEC 1.020 10/23/2023 1903   PHURINE 7.0 10/23/2023 1903   GLUCOSEU NEGATIVE 10/23/2023 1903   HGBUR MODERATE (A) 10/23/2023 1903   BILIRUBINUR NEGATIVE 10/23/2023 1903   KETONESUR 5 (A) 10/23/2023 1903   PROTEINUR 100 (A) 10/23/2023 1903   UROBILINOGEN 0.2 06/27/2020 1347   NITRITE NEGATIVE 10/23/2023 1903   LEUKOCYTESUR MODERATE (A) 10/23/2023 1903    Radiological Exams on Admission: I have personally reviewed images CT ABDOMEN PELVIS WO CONTRAST Result Date: 10/23/2023 CLINICAL DATA:  Diarrhea for 30 days. On hospice for ovarian cancer. Dark foul-smelling urine. Weight loss over the past 2 months. EXAM: CT ABDOMEN AND PELVIS WITHOUT CONTRAST TECHNIQUE: Multidetector CT imaging of the abdomen and pelvis was performed following the standard protocol without IV contrast. RADIATION DOSE REDUCTION: This exam was performed according to the departmental dose-optimization program which includes automated exposure control, adjustment of the mA and/or kV according to patient size and/or use of iterative reconstruction technique. COMPARISON:  PET/CT 12/20/2022 and CT abdomen pelvis 11/10/2022 FINDINGS: Lower chest: Moderate  pericardial effusion, increased from 11/10/2022. Hepatobiliary: Unremarkable noncontrast appearance of the liver. Biliary sludge. No biliary dilation. Pancreas: Unremarkable. Spleen: Unremarkable. Adrenals/Urinary Tract: Stable adrenal glands. There is new marked left hydroureteronephrosis. No radiopaque stone. Question soft tissue infiltration and urothelial thickening about the distal left ureter and UVJ (circa series 2/image 63). Diffuse wall thickening and perivesical stranding about the bladder greatest posteriorly. There is loss of the fat plane between the posterior bladder and vagina. Query vesicovaginal fistula. Gas within the anterior bladder. Stomach/Bowel: Irregular wall thickening about the rectum greater on the left (circa series 2/image 68). This is increased compared to 11/10/2022. There is adjacent perirectal fat stranding. No bowel obstruction. Stomach is within normal limits. Vascular/Lymphatic: No definite adenopathy noting limitations of noncontrast exam. Aortic atherosclerotic calcification. Reproductive: Calcifications in the uterine fundus likely represent fibroids. Fecalized debris in the vagina with colovaginal fistula (circa series 6/image 57). Other: No free intraperitoneal air. Free fluid in the pelvis. No abscess. Anterior abdominal hernia repair. Body wall edema. Musculoskeletal: No acute fracture. IMPRESSION: 1. 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 Associated infiltrative mass and/or inflammation about the rectum, vagina, and bladder. 2. New marked left hydroureteronephrosis. Limited evaluation without IV contrast however there is the suggestion of urothelial thickening and soft tissue infiltration about the left UVJ. No urinary calculi. 3. Moderate pericardial effusion, increased from 11/10/2022. 4. Aortic Atherosclerosis (ICD10-I70.0). Electronically Signed   By: Rozell Cornet M.D.   On: 10/23/2023 20:36     Assessment/Plan: Principal  Problem:   Rrectovaginalvesico fistula Active Problems:   Chronic diarrhea   Acute cystitis   Essential hypertension   Non-insulin  dependent type 2 diabetes mellitus ([name])   Intention tremor   Vaginal cancer ([name])   History of DVT (deep vein thrombosis)   Chronic anemia   Rectovesical fistula    Assessment and Plan: Rectovaginalvesico fistula History vaginal cancer currently on hospice care Chronic diarrhea Acute cystitis  Non-insulin -dependent DM type II Essential hypertension Intention tremor    History of DVT      DVT prophylaxis:  SQ Heparin Code Status:  Full Code Diet:  Family Communication:  *** Family was present at bedside, at the time of interview.  Opportunity was given to ask question and all questions were answered satisfactorily.  Disposition Plan:  ***  Consults:  ***  Admission status:  Observation, Telemetry bed  Severity of Illness: The appropriate patient status for this patient is OBSERVATION. Observation status is judged to be reasonable and necessary in order to provide the required intensity of service to ensure the patient's safety. The patient's presenting symptoms, physical exam findings, and initial radiographic and laboratory data in the context of their medical condition is felt to place them at decreased risk for further clinical deterioration. Furthermore, it is anticipated that the patient will be medically stable for discharge from the hospital within 2 midnights of admission.     Lynnix Schoneman, MD [facility]  How to contact the [facility] Attending or Consulting provider 7A - 7P or covering provider during after hours 7P -7A, for this patient.  Check the care team in [facility] and look for a) attending/consulting [facility] provider listed and b) the TR[facility] team listed Log into [email or web address] and use [place] universal password to access. If you do not have the password, please contact the hospital operator. Locate the TR[facility] provider you are  looking for under [facility] and page to a number that you can be directly reached. If you still have difficulty reaching the provider, please page the [facility] (Director on Call) for the Hospitalists listed on amion for assistance.  10/24/2023, 12:01 AM

## 2023-10-23 NOTE — ED Provider Notes (Signed)
  Provider Note MRN:  [id number]  Arrival date & time: 10/23/23    ED Course and Medical Decision Making  Assumed care of patient at sign-out or upon transfer.  Cervical cancer with fistula, very symptomatic, plan is for hospitalist admission with Gynonc evaluation in the morning.  Procedures  Final Clinical Impressions(s) / ED Diagnoses     ICD-10-CM   1. Malignant neoplasm of endocervix ([name])  C53.0     2. Rectovaginal fistula  N82.3       ED Discharge Orders     None       Discharge Instructions   None     Merrick Abe. Harless Lien, MD [facility] Emergency Medicine [facility] [email or web address]    Edson Graces, MD 10/23/23 [phone]

## 2023-10-24 DIAGNOSIS — Z681 Body mass index (BMI) 19 or less, adult: Secondary | ICD-10-CM | POA: Diagnosis not present

## 2023-10-24 DIAGNOSIS — I3139 Other pericardial effusion (noninflammatory): Secondary | ICD-10-CM | POA: Diagnosis present

## 2023-10-24 DIAGNOSIS — R5383 Other fatigue: Secondary | ICD-10-CM | POA: Diagnosis present

## 2023-10-24 DIAGNOSIS — N321 Vesicointestinal fistula: Secondary | ICD-10-CM | POA: Diagnosis present

## 2023-10-24 DIAGNOSIS — R64 Cachexia: Secondary | ICD-10-CM | POA: Diagnosis present

## 2023-10-24 DIAGNOSIS — Z803 Family history of malignant neoplasm of breast: Secondary | ICD-10-CM | POA: Diagnosis not present

## 2023-10-24 DIAGNOSIS — C52 Malignant neoplasm of vagina: Secondary | ICD-10-CM

## 2023-10-24 DIAGNOSIS — Z7984 Long term (current) use of oral hypoglycemic drugs: Secondary | ICD-10-CM | POA: Diagnosis not present

## 2023-10-24 DIAGNOSIS — M1990 Unspecified osteoarthritis, unspecified site: Secondary | ICD-10-CM | POA: Diagnosis present

## 2023-10-24 DIAGNOSIS — I700 Atherosclerosis of aorta: Secondary | ICD-10-CM | POA: Diagnosis present

## 2023-10-24 DIAGNOSIS — Z923 Personal history of irradiation: Secondary | ICD-10-CM | POA: Diagnosis not present

## 2023-10-24 DIAGNOSIS — E119 Type 2 diabetes mellitus without complications: Secondary | ICD-10-CM | POA: Diagnosis present

## 2023-10-24 DIAGNOSIS — N823 Fistula of vagina to large intestine: Principal | ICD-10-CM

## 2023-10-24 DIAGNOSIS — K529 Noninfective gastroenteritis and colitis, unspecified: Secondary | ICD-10-CM

## 2023-10-24 DIAGNOSIS — I110 Hypertensive heart disease with heart failure: Secondary | ICD-10-CM | POA: Diagnosis present

## 2023-10-24 DIAGNOSIS — N3001 Acute cystitis with hematuria: Secondary | ICD-10-CM

## 2023-10-24 DIAGNOSIS — Z8541 Personal history of malignant neoplasm of cervix uteri: Secondary | ICD-10-CM | POA: Diagnosis not present

## 2023-10-24 DIAGNOSIS — Z800 Family history of malignant neoplasm of digestive organs: Secondary | ICD-10-CM | POA: Diagnosis not present

## 2023-10-24 DIAGNOSIS — Z66 Do not resuscitate: Secondary | ICD-10-CM | POA: Diagnosis present

## 2023-10-24 DIAGNOSIS — Z515 Encounter for palliative care: Secondary | ICD-10-CM | POA: Diagnosis not present

## 2023-10-24 DIAGNOSIS — D638 Anemia in other chronic diseases classified elsewhere: Secondary | ICD-10-CM | POA: Diagnosis present

## 2023-10-24 DIAGNOSIS — I5032 Chronic diastolic (congestive) heart failure: Secondary | ICD-10-CM | POA: Diagnosis present

## 2023-10-24 DIAGNOSIS — Z7982 Long term (current) use of aspirin: Secondary | ICD-10-CM | POA: Diagnosis not present

## 2023-10-24 DIAGNOSIS — G252 Other specified forms of tremor: Secondary | ICD-10-CM | POA: Diagnosis present

## 2023-10-24 DIAGNOSIS — N136 Pyonephrosis: Secondary | ICD-10-CM | POA: Diagnosis present

## 2023-10-24 DIAGNOSIS — E559 Vitamin D deficiency, unspecified: Secondary | ICD-10-CM | POA: Diagnosis present

## 2023-10-24 DIAGNOSIS — E43 Unspecified severe protein-calorie malnutrition: Secondary | ICD-10-CM | POA: Diagnosis present

## 2023-10-24 DIAGNOSIS — N820 Vesicovaginal fistula: Secondary | ICD-10-CM | POA: Diagnosis not present

## 2023-10-24 LAB — CBC
HCT: 27.5 % — ABNORMAL LOW (ref 36.0–46.0)
Hemoglobin: 8.6 g/dL — ABNORMAL LOW (ref 12.0–15.0)
MCH: 30.2 pg (ref 26.0–34.0)
MCHC: 31.3 g/dL (ref 30.0–36.0)
MCV: 96.5 fL (ref 80.0–100.0)
Platelets: 344 10*3/uL (ref 150–400)
RBC: 2.85 MIL/uL — ABNORMAL LOW (ref 3.87–5.11)
RDW: 14.7 % (ref 11.5–15.5)
WBC: 15.4 10*3/uL — ABNORMAL HIGH (ref 4.0–10.5)
nRBC: 0 % (ref 0.0–0.2)

## 2023-10-24 LAB — COMPREHENSIVE METABOLIC PANEL WITH GFR
ALT: 11 U/L (ref 0–44)
AST: 11 U/L — ABNORMAL LOW (ref 15–41)
Albumin: 1.7 g/dL — ABNORMAL LOW (ref 3.5–5.0)
Alkaline Phosphatase: 59 U/L (ref 38–126)
Anion gap: 9 (ref 5–15)
BUN: 26 mg/dL — ABNORMAL HIGH (ref 8–23)
CO2: 27 mmol/L (ref 22–32)
Calcium: 8.9 mg/dL (ref 8.9–10.3)
Chloride: 106 mmol/L (ref 98–111)
Creatinine, Ser: 0.84 mg/dL (ref 0.44–1.00)
GFR, Estimated: >60 mL/min (ref >60)
Glucose, Bld: 78 mg/dL (ref 70–99)
Potassium: 3.9 mmol/L (ref 3.5–5.1)
Sodium: 142 mmol/L (ref 135–145)
Total Bilirubin: 0.6 mg/dL (ref 0.0–1.2)
Total Protein: 5.8 g/dL — ABNORMAL LOW (ref 6.5–8.1)

## 2023-10-24 MED ORDER — SODIUM CHLORIDE 0.9% FLUSH
3.0000 mL | Freq: Two times a day (BID) | INTRAVENOUS | Status: DC
  Administered 2023-10-24 – 2023-10-25 (×2): 3 mL via INTRAVENOUS

## 2023-10-24 MED ORDER — SODIUM CHLORIDE 0.9 % IV SOLN
1.0000 g | INTRAVENOUS | Status: DC
  Administered 2023-10-25 – 2023-10-26 (×2): 1 g via INTRAVENOUS
  Filled 2023-10-24 (×2): qty 10

## 2023-10-24 MED ORDER — SODIUM CHLORIDE 0.9% FLUSH
3.0000 mL | Freq: Two times a day (BID) | INTRAVENOUS | Status: DC
  Administered 2023-10-24 – 2023-10-26 (×4): 3 mL via INTRAVENOUS

## 2023-10-24 MED ORDER — SODIUM CHLORIDE 0.9% FLUSH: 3.0000 mL | INTRAVENOUS | Status: DC | PRN

## 2023-10-24 MED ORDER — OXYCODONE HCL 5 MG PO TABS: 5.0000 mg | ORAL_TABLET | ORAL | Status: DC | PRN

## 2023-10-24 MED ORDER — ACETAMINOPHEN 650 MG RE SUPP: 650.0000 mg | Freq: Four times a day (QID) | RECTAL | Status: DC | PRN

## 2023-10-24 MED ORDER — HEPARIN SODIUM (PORCINE) 5000 UNIT/ML IJ SOLN
5000.0000 [IU] | Freq: Three times a day (TID) | INTRAMUSCULAR | Status: DC
  Administered 2023-10-24 – 2023-10-26 (×7): 5000 [IU] via SUBCUTANEOUS
  Filled 2023-10-24 (×7): qty 1

## 2023-10-24 MED ORDER — ONDANSETRON 4 MG PO TBDP: 4.0000 mg | ORAL_TABLET | ORAL | Status: DC | PRN

## 2023-10-24 MED ORDER — LOPERAMIDE HCL 2 MG PO CAPS
4.0000 mg | ORAL_CAPSULE | ORAL | Status: DC | PRN
  Administered 2023-10-24 (×2): 4 mg via ORAL
  Filled 2023-10-24 (×2): qty 2

## 2023-10-24 MED ORDER — ENSURE ENLIVE PO LIQD
237.0000 mL | Freq: Two times a day (BID) | ORAL | Status: DC
  Administered 2023-10-25 – 2023-10-26 (×3): 237 mL via ORAL

## 2023-10-24 MED ORDER — LACTATED RINGERS IV SOLN: INTRAVENOUS | Status: AC

## 2023-10-24 MED ORDER — PSYLLIUM 95 % PO PACK
1.0000 | PACK | Freq: Every day | ORAL | Status: DC
  Administered 2023-10-24 – 2023-10-26 (×3): 1 via ORAL
  Filled 2023-10-24 (×3): qty 1

## 2023-10-24 MED ORDER — MECLIZINE HCL 25 MG PO TABS: 25.0000 mg | ORAL_TABLET | Freq: Three times a day (TID) | ORAL | Status: DC | PRN

## 2023-10-24 MED ORDER — POLYETHYLENE GLYCOL 3350 17 G PO PACK: 17.0000 g | PACK | ORAL | Status: DC

## 2023-10-24 MED ORDER — SODIUM CHLORIDE 0.9 % IV SOLN: 250.0000 mL | INTRAVENOUS | Status: AC | PRN

## 2023-10-24 MED ORDER — ZINC OXIDE 40 % EX OINT
TOPICAL_OINTMENT | Freq: Two times a day (BID) | CUTANEOUS | Status: DC
  Filled 2023-10-24: qty 57

## 2023-10-24 MED ORDER — ACETAMINOPHEN 325 MG PO TABS
650.0000 mg | ORAL_TABLET | Freq: Four times a day (QID) | ORAL | Status: DC | PRN
  Filled 2023-10-24: qty 2

## 2023-10-24 NOTE — Consult Note (Addendum)
 WOC Nurse Consult Note: Reason for Consult: Consult requested for vagina and perineal area.  Performed remotely after review of progress notes.   Pt is noted to have a vaginal fistula and is frequently leaking stool which has caused red, moist macerated and painful skin consistent with moisture associated skin damage.  Dressing procedure/placement/frequency: Topical treatment orders provided for bedside nurses to perform as follows to repel moisture and protect skin: Apply Desitin to perineal/vaginal area BID and PRN with each turning and cleaning episode. Please re-consult if further assistance is needed.  Thank-you,  Wiliam Harder MSN, RN, CWOCN, [place], CNS [phone]

## 2023-10-24 NOTE — Progress Notes (Signed)
 TED hose and SCD ordered for Pt.

## 2023-10-24 NOTE — Consult Note (Signed)
 GYNECOLOGIC ONCOLOGY INPATIENT CONSULTATION  Date of Service: 10/23/2023  Requesting Service: Emergency Department Requesting Provider: Afton Horse, DO Consulting Provider: Derrel Flies, MD   HISTORY OF PRESENT ILLNESS: Megan Ryan is a 88 y.o. woman who is seen in consultation at the request of Afton Horse, DO for evaluation of new cloacal defect in the setting of recurrent SCC of the vagina, primary treatment with radiation (completed 01/2022), recurred in 11/2022, currently on home hospice.  Pt presents in the setting of one month of diarrhea. Also notes fatigue, decreased appetite. She reports that at its onset, she was having 10 loose bowel movements a day. This may be down to around 5 a day. She is incontinent of stool. She reports irritation on her vulva from the diarrhea.  She underwent a CT scan in the ED which noted a rectovaginalvesico fistula associated with infiltrative mass. Urinalysis was collected which was likely contaminated with stool.   PAST MEDICAL HISTORY: Past Medical History:  Diagnosis Date   Arthritis    knee and back pain   Cervical cancer ([name]) 10/02/2021   Diabetes mellitus ([name]) 06/27/2016   History of radiation therapy    Vagina- 11/17/21-02/09/22-Dr. Retta Caster   Hypertension     PAST SURGICAL HISTORY: Past Surgical History:  Procedure Laterality Date   HERNIA REPAIR  2003   repair with mesh   HYSTERECTOMY ABDOMINAL WITH SALPINGO-OOPHORECTOMY  1960   doesn't remember why, doesn't remember being on HRT (never had menopausal symptoms)    OB/GYN HISTORY: OB History  Gravida Para Term Preterm AB Living  2 2    2   SAB IAB Ectopic Multiple Live Births          # Outcome Date GA Lbr Len/2nd Weight Sex Type Anes PTL Lv  2 Para           1 Para             MEDICATIONS:  Current Facility-Administered Medications:    0.9 %  sodium chloride  infusion, 250 mL, Intravenous, PRN, Sundil, Subrina, MD   acetaminophen  (TYLENOL ) tablet 650  mg, 650 mg, Oral, Q6H PRN **OR** acetaminophen  (TYLENOL ) suppository 650 mg, 650 mg, Rectal, Q6H PRN, Sundil, Subrina, MD   Cecily Cohen ON 10/25/2023] cefTRIAXone  (ROCEPHIN ) 1 g in sodium chloride  0.9 % 100 mL IVPB, 1 g, Intravenous, Q24H, Sundil, Subrina, MD   heparin injection 5,000 Units, 5,000 Units, Subcutaneous, Q8H, Sundil, Subrina, MD, 5,000 Units at 10/24/23 1610   lactated ringers infusion, , Intravenous, Continuous, Sundil, Subrina, MD, Last Rate: 75 mL/hr at 10/24/23 0319, Infusion Verify at 10/24/23 0319   loperamide (IMODIUM) capsule 4 mg, 4 mg, Oral, PRN, Sundil, Subrina, MD   meclizine  (ANTIVERT ) tablet 25 mg, 25 mg, Oral, TID PRN, Sundil, Subrina, MD   ondansetron  (ZOFRAN -ODT) disintegrating tablet 4 mg, 4 mg, Oral, Q4H PRN, Sundil, Subrina, MD   oxyCODONE  (Oxy IR/ROXICODONE ) immediate release tablet 5 mg, 5 mg, Oral, Q4H PRN, Sundil, Subrina, MD   polyethylene glycol (MIRALAX  / GLYCOLAX ) packet 17 g, 17 g, Oral, QODAY, Sundil, Subrina, MD   sodium chloride  flush (NS) 0.9 % injection 3 mL, 3 mL, Intravenous, Q12H, Sundil, Subrina, MD   sodium chloride  flush (NS) 0.9 % injection 3 mL, 3 mL, Intravenous, Q12H, Sundil, Subrina, MD   sodium chloride  flush (NS) 0.9 % injection 3 mL, 3 mL, Intravenous, PRN, Sundil, Subrina, MD  ALLERGIES: No Known Allergies  FAMILY HISTORY: Family History  Problem Relation Age of Onset   Diabetes Mother  Diabetes Father    Breast cancer Sister    Diabetes Sister    Cancer Sister        thinks may have had colon or stomach cancer   Prostate cancer Brother    Diabetes Brother    Cancer Brother        Liver cancer   CAD Neg Hx    Colon cancer Neg Hx    Ovarian cancer Neg Hx    Endometrial cancer Neg Hx    Pancreatic cancer Neg Hx     SOCIAL HISTORY: Social History   Socioeconomic History   Marital status: Divorced    Spouse name: Not on file   Number of children: 2   Years of education: Not on file   Highest education level: Not on  file  Occupational History   Not on file  Tobacco Use   Smoking status: Never   Smokeless tobacco: Never  Vaping Use   Vaping status: Never Used  Substance and Sexual Activity   Alcohol use: No   Drug use: No   Sexual activity: Not Currently  Other Topics Concern   Not on file  Social History Narrative   Not on file   Social Drivers of Health   Financial Resource Strain: Not on file  Food Insecurity: No Food Insecurity (10/24/2023)   Hunger Vital Sign    Worried About Running Out of Food in the Last Year: Never true    Ran Out of Food in the Last Year: Never true  Transportation Needs: No Transportation Needs (10/24/2023)   PRAPARE - Administrator, Civil Service (Medical): No    Lack of Transportation (Non-Medical): No  Physical Activity: Not on file  Stress: Not on file  Social Connections: Moderately Integrated (10/24/2023)   Social Connection and Isolation Panel [NHANES]    Frequency of Communication with Friends and Family: More than three times a week    Frequency of Social Gatherings with Friends and Family: More than three times a week    Attends Religious Services: 1 to 4 times per year    Active Member of [employer] or Organizations: Yes    Attends Banker Meetings: Never    Marital Status: Divorced  Catering manager Violence: Patient Declined (10/24/2023)   Humiliation, Afraid, Rape, and Kick questionnaire    Fear of Current or Ex-Partner: Patient declined    Emotionally Abused: Patient declined    Physically Abused: Patient declined    Sexually Abused: Patient declined    PHYSICAL EXAM: BP 117/69 (BP Location: Right Arm)   Pulse 72   Temp 98.3 F (36.8 C) (Oral)   Resp 18   Ht 5\' 7"  (1.702 m)   Wt 100 lb (45.4 kg)   SpO2 100%   BMI 15.66 kg/m  General: Alert, oriented, no acute distress. HEENT: Normocephalic, atraumatic.   Chest: Normal work of breathing Abdomen: Soft, nondistended. Extremities: Grossly normal range of motion.   Warm, well perfused.  No SCDs on Skin: No rashes or lesions. GU: External genital exam with loose stool on sheets underneath patient and with stool on vulva. irritation of vulvar skins with tenderness to palpation by patient. Internal exam deferred.  LABORATORY AND RADIOLOGIC DATA: Outside medical records were reviewed to synthesize the above history, along with the history and physical obtained during the visit.  Laboratory results and imaging reports were reviewed, with pertinent results below.  I personally reviewed the images.  CT abdomen/pelvis: IMPRESSION: 1. Constellation of findings  compatible with rectovaginalvesico fistula. Associated infiltrative mass and/or inflammation about the rectum, vagina, and bladder. 2. New marked left hydroureteronephrosis. Limited evaluation without IV contrast however there is the suggestion of urothelial thickening and soft tissue infiltration about the left UVJ. No urinary calculi. 3. Moderate pericardial effusion, increased from 11/10/2022. 4. Aortic Atherosclerosis (ICD10-I70.0).  CBC    Component Value Date/Time   WBC 15.4 (H) 10/24/2023 0612   RBC 2.85 (L) 10/24/2023 0612   HGB 8.6 (L) 10/24/2023 0612   HGB 9.2 (L) 12/08/2021 1100   HCT 27.5 (L) 10/24/2023 0612   PLT 344 10/24/2023 0612   PLT 198 12/08/2021 1100   MCV 96.5 10/24/2023 0612   MCH 30.2 10/24/2023 0612   MCHC 31.3 10/24/2023 0612   RDW 14.7 10/24/2023 0612   LYMPHSABS 0.7 10/23/2023 1804   MONOABS 0.6 10/23/2023 1804   EOSABS 0.0 10/23/2023 1804   BASOSABS 0.0 10/23/2023 1804     CMP     Component Value Date/Time   NA 142 10/24/2023 0612   NA 140 10/05/2020 0951   K 3.9 10/24/2023 0612   CL 106 10/24/2023 0612   CO2 27 10/24/2023 0612   GLUCOSE 78 10/24/2023 0612   BUN 26 (H) 10/24/2023 0612   BUN 20 10/05/2020 0951   CREATININE 0.84 10/24/2023 0612   CREATININE 0.91 04/15/2022 0923   CREATININE 1.06 (H) 07/09/2021 0000   CALCIUM 8.9 10/24/2023 0612   PROT  5.8 (L) 10/24/2023 0612   PROT 7.9 09/30/2019 0914   ALBUMIN 1.7 (L) 10/24/2023 0612   ALBUMIN 3.8 09/30/2019 0914   AST 11 (L) 10/24/2023 0612   AST 21 12/08/2021 1100   ALT 11 10/24/2023 0612   ALT 22 12/08/2021 1100   ALKPHOS 59 10/24/2023 0612   BILITOT 0.6 10/24/2023 0612   BILITOT 0.4 12/08/2021 1100   GFRNONAA >60 10/24/2023 0612   GFRNONAA >60 04/15/2022 0923   GFRAA 68 04/06/2020 0813     ASSESSMENT AND PLAN: Megan Ryan is a 88 y.o. woman with recurrent SCC of the vagina (primary treatment with radiation), currently on home hospice, who presents for evaluation of 1 month of diarrhea, found on imaging to have a new cloacal defect.  Reviewed imaging findings. Reviewed contributing factors of diarrhea. Reviewed patient's goals of treatment.  Discussed that the most invasive treatment for her symptoms could include a diverting colostomy. She expresses that this is not currently consistent with her goals and wishes and I am in agreement with this at this time.  Patient with C diff screen negative. GI pathogen panel pending. Reviewed that once infection is ruled out, would recommend conservative measures to help bulk and slow her stools so that her fistula is more manageable.   Also discussed that given the cloacal defect, urinary tract infections may be a recurring issue and may ultimately be life limiting but pt currently without systemic signs of infection. Currently getting IV abx.   Recommend: - Start metamucil to bulk stools - Once infection ruled out, could consider immodium to help with slowing stools - Recommend wound team consultation for recommendations of barrier treatments to vulva to help with irritation from stool contamination   The above assessment and plan was communicated to the patient's primary team.

## 2023-10-24 NOTE — Progress Notes (Signed)
 PROGRESS NOTE    Megan Ryan  VWU:[id number] DOB: Nov 21, 1935 DOA: 10/23/2023 PCP: Charle Congo, MD   Brief Narrative:  HPI:  Megan Ryan is a 88 y.o. 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 Report that she is having ongoing diarrhea with associated crampy abdominal pain, losing weight, dark concentrated urine, foul-smelling urine.  Patient denies any fever and chill.     ED Course:  At presentation to ED patient is borderline hypotensive otherwise hemodynamically stable. C. difficile negative. Pending GI panel. CMP showing elevated BUN 3.2 otherwise unremarkable. CBC showing leukocytosis 16.2, stable H&H 9.8 and 32.  Normal platelet count. UA showing evidence of UTI.  Pending urine culture.  She was diagnosed with  rectovaginalvesico fistula. Associated infiltrative mass and/or inflammation about the rectum, vagina, and bladder  Assessment & Plan:   Principal Problem:   Rrectovaginalvesico fistula Active Problems:   Chronic diarrhea   Acute cystitis   Essential hypertension   Non-insulin  dependent type 2 diabetes mellitus ([name])   Intention tremor   Vaginal cancer ([name])   History of DVT (deep vein thrombosis)   Chronic anemia   Rectovesical fistula  Rectovaginalvesico fistula History recurrent vaginal cancer s/pc radiation currently on hospice care Chronic diarrhea Acute cystitis/UTI Left-sided hydroureteronephrosis Per daughter, the patient has relapse of vaginal cancer 6 months away of past radiation and during that time patient decided not to pursue further treatment due to advanced age and she has been transition to home  hospice care eventually and during that time all medications has been discontinued including Eliquis , insulin  and blood pressure regimen.  Admitted with ongoing diarrhea and was diagnosed with UTI and rectovaginalvesico fistula.  Patient was admitted to hospital service per oncology recommendations for symptomatic treatment.  Started on Rocephin , we will continue that and follow urine culture.  Awaiting GYN oncology/Dr. Daisey Dryer to see this patient.  C. difficile negative.  I have also consulted palliative care to have goal of care discussion with the patient and family since patient was already under hospice care.   History of DVT of the left lower extremity Non-insulin -dependent DM type II Essential hypertension Intention tremor -Family reported given patient is on hospice care all treatment has been discontinued in 2024. -At this time family does not want to pursue any aggressive management except IV antibiotic and fluid.  However they are looking forward to speak with GYN oncology for further recommendation regarding new findings as mentioned above.  DVT prophylaxis: heparin injection 5,000 Units Start: 10/24/23 0600 SCDs Start: 10/24/23 0002 Place TED hose Start: 10/24/23 0002   Code Status: Limited: Do not attempt resuscitation (DNR) -DNR-LIMITED -Do Not Intubate/DNI   Family Communication: Daughter present at bedside.  Plan of care discussed with patient in length and he/she verbalized understanding and agreed with it.  Status is: Observation The patient will require care spanning > 2 midnights and should be moved to inpatient because: Awaiting GYN oncology recommendations/evaluation and palliative care evaluation.   Estimated body mass index is 15.66 kg/m as calculated from the following:   Height as of this encounter: 5\' 7"  (1.702 m).   Weight as of this encounter: 45.4 kg.    Nutritional Assessment: Body mass index is 15.66 kg/m.Megan Ryan Seen  by dietician.  I agree with the assessment  and plan as outlined below: Nutrition Status:        . Skin Assessment: I have examined the patient's skin and I agree with the wound assessment as performed by the wound care RN as outlined below:    Consultants:  GYN oncology and Perative care  Procedures:  As above  Antimicrobials:  Anti-infectives (From admission, onward)    Start     Dose/Rate Route Frequency Ordered Stop   10/25/23 2200  cefTRIAXone  (ROCEPHIN ) 1 g in sodium chloride  0.9 % 100 mL IVPB        1 g 200 mL/hr over 30 Minutes Intravenous Every 24 hours 10/24/23 0001 10/31/23 2159   10/23/23 2200  cefTRIAXone  (ROCEPHIN ) 1 g in sodium chloride  0.9 % 100 mL IVPB        1 g 200 mL/hr over 30 Minutes Intravenous  Once 10/23/23 2159 10/23/23 2357         Subjective: Patient seen and examined.  Daughter at the bedside.  Patient is fully alert and oriented and pleasant.  She has no complaints today at all.  She continues to have diarrhea though.  C. difficile has been ruled out but GI pathogen panel still pending and sample not collected yet.  Objective: Vitals:   10/23/23 2330 10/23/23 2352 10/24/23 0042 10/24/23 0454  BP: (!) 120/103  (!) 146/85 117/69  Pulse: 73 73 96 72  Resp: 18  18 18   Temp:   (!) 97.5 F (36.4 C) 98.3 F (36.8 C)  TempSrc:   Oral Oral  SpO2: 100% 100% 94% 100%  Weight:      Height:        Intake/Output Summary (Last 24 hours) at 10/24/2023 0750 Last data filed at 10/24/2023 0319 Gross per 24 hour  Intake 1837.17 ml  Output --  Net 1837.17 ml   Filed Weights   10/23/23 1714  Weight: 45.4 kg    Examination:  General exam: Appears calm and comfortable but very cachectic Respiratory system: Clear to auscultation. Respiratory effort normal. Cardiovascular system: S1 & S2 heard, RRR. No JVD, murmurs, rubs, gallops or clicks. No pedal edema. Gastrointestinal system: Abdomen is nondistended, soft and nontender. No organomegaly or masses felt. Normal bowel sounds heard. Central  nervous system: Alert and oriented. No focal neurological deficits. Extremities: Symmetric 5 x 5 power. Skin: No rashes, lesions or ulcers Psychiatry: Judgement and insight appear normal. Mood & affect appropriate.    Data Reviewed: I have personally reviewed following labs and imaging studies  CBC: Recent Labs  Lab 10/23/23 1804 10/24/23 0612  WBC 16.2* 15.4*  NEUTROABS 14.8*  --   HGB 9.8* 8.6*  HCT 32.3* 27.5*  MCV 100.0 96.5  PLT 383 344   Basic Metabolic Panel: Recent Labs  Lab 10/23/23 1804 10/24/23 0612  NA 143 142  K 3.7 3.9  CL 107 106  CO2 23 27  GLUCOSE 90 78  BUN 32* 26*  CREATININE 0.98 0.84  CALCIUM 9.6 8.9   GFR: Estimated Creatinine Clearance: 34.5 mL/min (by C-G formula based on SCr of 0.84 mg/dL). Liver Function Tests: Recent Labs  Lab 10/23/23 1804 10/24/23 0612  AST 18 11*  ALT 15 11  ALKPHOS 78 59  BILITOT 0.8 0.6  PROT 6.7 5.8*  ALBUMIN 2.3* 1.7*   No results for input(s): "LIPASE", "AMYLASE" in the last 168 hours. No results for input(s): "AMMONIA" in the last 168 hours. Coagulation Profile: No results for input(s): "  INR", "PROTIME" in the last 168 hours. Cardiac Enzymes: No results for input(s): "CKTOTAL", "CKMB", "CKMBINDEX", "TROPONINI" in the last 168 hours. BNP (last 3 results) No results for input(s): "PROBNP" in the last 8760 hours. HbA1C: No results for input(s): "HGBA1C" in the last 72 hours. CBG: No results for input(s): "GLUCAP" in the last 168 hours. Lipid Profile: No results for input(s): "CHOL", "HDL", "LDLCALC", "TRIG", "CHOLHDL", "LDLDIRECT" in the last 72 hours. Thyroid  Function Tests: No results for input(s): "TSH", "T4TOTAL", "FREET4", "T3FREE", "THYROIDAB" in the last 72 hours. Anemia Panel: No results for input(s): "VITAMINB12", "FOLATE", "FERRITIN", "TIBC", "IRON", "RETICCTPCT" in the last 72 hours. Sepsis Labs: No results for input(s): "PROCALCITON", "LATICACIDVEN" in the last 168 hours.  Recent Results  (from the past 240 hours)  C Difficile Quick Screen w PCR reflex     Status: None   Collection Time: 10/23/23  5:46 PM   Specimen: STOOL  Result Value Ref Range Status   C Diff antigen NEGATIVE NEGATIVE Final   C Diff toxin NEGATIVE NEGATIVE Final   C Diff interpretation No C. difficile detected.  Final    Comment: Performed at [facility], [address], [place], [place] [address]     Radiology Studies: CT ABDOMEN PELVIS WO CONTRAST Result Date: 10/23/2023 CLINICAL DATA:  Diarrhea for 30 days. On hospice for ovarian cancer. Dark foul-smelling urine. Weight loss over the past 2 months. EXAM: CT ABDOMEN AND PELVIS WITHOUT CONTRAST TECHNIQUE: Multidetector CT imaging of the abdomen and pelvis was performed following the standard protocol without IV contrast. RADIATION DOSE REDUCTION: This exam was performed according to the departmental dose-optimization program which includes automated exposure control, adjustment of the mA and/or kV according to patient size and/or use of iterative reconstruction technique. COMPARISON:  PET/CT 12/20/2022 and CT abdomen pelvis 11/10/2022 FINDINGS: Lower chest: Moderate pericardial effusion, increased from 11/10/2022. Hepatobiliary: Unremarkable noncontrast appearance of the liver. Biliary sludge. No biliary dilation. Pancreas: Unremarkable. Spleen: Unremarkable. Adrenals/Urinary Tract: Stable adrenal glands. There is new marked left hydroureteronephrosis. No radiopaque stone. Question soft tissue infiltration and urothelial thickening about the distal left ureter and UVJ (circa series 2/image 63). Diffuse wall thickening and perivesical stranding about the bladder greatest posteriorly. There is loss of the fat plane between the posterior bladder and vagina. Query vesicovaginal fistula. Gas within the anterior bladder. Stomach/Bowel: Irregular wall thickening about the rectum greater on the left (circa series 2/image 68). This is increased compared to 11/10/2022.  There is adjacent perirectal fat stranding. No bowel obstruction. Stomach is within normal limits. Vascular/Lymphatic: No definite adenopathy noting limitations of noncontrast exam. Aortic atherosclerotic calcification. Reproductive: Calcifications in the uterine fundus likely represent fibroids. Fecalized debris in the vagina with colovaginal fistula (circa series 6/image 57). Other: No free intraperitoneal air. Free fluid in the pelvis. No abscess. Anterior abdominal hernia repair. Body wall edema. Musculoskeletal: No acute fracture. IMPRESSION: 1. 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 Associated infiltrative mass and/or inflammation about the rectum, vagina, and bladder. 2. New marked left hydroureteronephrosis. Limited evaluation without IV contrast however there is the suggestion of urothelial thickening and soft tissue infiltration about the left UVJ. No urinary calculi. 3. Moderate pericardial effusion, increased from 11/10/2022. 4. Aortic Atherosclerosis (ICD10-I70.0). Electronically Signed   By: Rozell Cornet M.D.   On: 10/23/2023 20:36    Scheduled Meds:  heparin  5,000 Units Subcutaneous Q8H   polyethylene glycol  17 g Oral QODAY   sodium chloride  flush  3 mL Intravenous Q12H   sodium  chloride flush  3 mL Intravenous Q12H   Continuous Infusions:  sodium chloride      [START ON 10/25/2023] cefTRIAXone  (ROCEPHIN )  IV     lactated ringers 75 mL/hr at 10/24/23 0319     LOS: 0 days   Modena Andes, MD [facility]  10/24/2023, 7:50 AM   *Please note that this is a verbal dictation therefore any spelling or grammatical errors are due to the "Dragon [employer] One" system interpretation.  Please page via Amion and do not message via secure chat for urgent patient care matters. Secure chat can be used for non urgent patient care matters.  How to contact the [facility] Attending or Consulting provider 7A - 7P or covering provider during after hours 7P -7A, for  this patient?  Check the care team in [facility] and look for a) attending/consulting [facility] provider listed and b) the [facility] team listed. Page or secure chat 7A-7P. Log into [email or web address] and use Corinne's universal password to access. If you do not have the password, please contact the hospital operator. Locate the [facility] provider you are looking for under [facility] and page to a number that you can be directly reached. If you still have difficulty reaching the provider, please page the [facility] (Director on Call) for the Hospitalists listed on amion for assistance.

## 2023-10-24 NOTE — Progress Notes (Signed)
 Palliative-brief note, full consult to follow  Patient admitted with prolonged diarrhea.  She is under care of [facility] hosp[facility].  Discussed with [facility] liaison and with family. GOC is for symptom management of diarrhea and then likely home with hospice. Daughter wants to take patient home with hospice, but has some concerns about level of support she is receiving. Hospice liaison to contact daughter to discuss increased service support.    Micki Alas, AGNP-C Palliative Medicine  No charge

## 2023-10-24 NOTE — Plan of Care (Signed)

## 2023-10-24 NOTE — Consult Note (Signed)
 Consultation Note Date: 10/24/2023   Patient Name: Megan Ryan  DOB: Aug 01, 1935  MRN: [id number]  Age / Sex: 88 y.o., female  PCP: Charle Congo, MD Referring Physician: Modena Andes, MD  Reason for Consultation:  goal of care conversation, patient having colovaginal fistula, already on hospice at home  HPI/Patient Profile: 88 y.o. female  with past medical history of vaginal cancer s/p radiation therapy 01/2022 with good response- however, PET on 11/2022 indicated recurrence/progression- patient declined further treatment. She enrolled with [facility] hospice in September 2024. Admitted on 10/23/2023 with diarrhea, weakness and poor po intake. Palliative consulted for goals of care.   Primary Decision Maker NEXT OF KIN - daughter [name]  Discussion:  Chart reviewed including labs, progress notes, imaging from this and previous encounters.  Met at the bedside with patient, patient's daughter, and patient's sisters.  Patient has been on hospice since September. Family understands that GOC for hospice are for good symptom management when there is no longer treatment for an illness- in [name]'s case this is her cancer.  They note that goal of coming to hospital was for better control of her diarrhea.  They had tried using immodium at home with no relief. If there is an infectious cause then they would choose to treat it. They do want to treat her UTI on this admission, but understand it is likely to recur due to her fistula.  Prior to admission [name] had decreased appetite and has lost a great amount of weight. She stays in her bed most of the time, but is able to ambulate with assistance around the home. She does not sleep more than she is awake. Family is concerned about Patricia's ability to provide good care for Disa at home. [name] notes that Deboran wants to die at home and that after  discharge, if they were at home she would likely not want to bring Teneil back to the hospital.  I called and spoke with hospice liaison Alisa App for [facility]. Alisa App is going to reach out to family and discussed if there can be increased level of services at home.     SUMMARY OF RECOMMENDATIONS   -Continue current interventions -Recommend taking immodium- starting with 8mg  followed by 2mg  after each diarrhea movement -If fails immodium - can try changing to lomotil -Return home with hospice- [facility]  Code Status/Advance Care Planning:   Code Status: Limited: Do not attempt resuscitation (DNR) -DNR-LIMITED -Do Not Intubate/DNI     Prognosis:   < 3 months  Discharge Planning: Home with Hospice  Primary Diagnoses: Present on Admission:  Essential hypertension  Vaginal cancer ([name])  Intention tremor  Rectovesical fistula   Review of Systems  Physical Exam Nursing note reviewed.  Constitutional:      Appearance: She is ill-appearing.     Comments: frail  Neurological:     Mental Status: She is alert.     Vital Signs: BP 109/64 (BP Location: Right Arm)   Pulse 82   Temp 98.2 F (36.8 C) (Oral)  Resp 17   Ht 5\' 7"  (1.702 m)   Wt 45.4 kg   SpO2 100%   BMI 15.66 kg/m  Pain Scale: 0-10   Pain Score: 0-No pain   SpO2: SpO2: 100 % O2 Device:SpO2: 100 % O2 Flow Rate: .   IO: Intake/output summary:  Intake/Output Summary (Last 24 hours) at 10/24/2023 1403 Last data filed at 10/24/2023 0900 Gross per 24 hour  Intake 1957.17 ml  Output --  Net 1957.17 ml    LBM: Last BM Date : 10/24/23 Baseline Weight: Weight: 45.4 kg Most recent weight: Weight: 45.4 kg       Thank you for this consult. Palliative medicine will continue to follow and assist as needed.  Time Total: 90 minutes Signed by: Micki Alas, AGNP-C Palliative Medicine  Time includes:   Preparing to see the patient (e.g., review of tests) Obtaining and/or reviewing separately obtained  history Performing a medically necessary appropriate examination and/or evaluation Counseling and educating the patient/family/caregiver Ordering medications, tests, or procedures Referring and communicating with other health care professionals (when not reported separately) Documenting clinical information in the electronic or other health record Independently interpreting results (not reported separately) and communicating results to the patient/family/caregiver Care coordination (not reported separately) Clinical documentation   Please contact Palliative Medicine Team phone at [phone] for questions and concerns.  For individual provider: See Tilford Foley

## 2023-10-24 NOTE — Plan of Care (Signed)
  Problem: Pain Managment: Goal: General experience of comfort will improve and/or be controlled Outcome: Progressing   Problem: Safety: Goal: Ability to remain free from injury will improve Outcome: Progressing   Problem: Skin Integrity: Goal: Risk for impaired skin integrity will decrease Outcome: Progressing   Problem: Elimination: Goal: Will not experience complications related to urinary retention Outcome: Not Progressing

## 2023-10-25 DIAGNOSIS — N820 Vesicovaginal fistula: Secondary | ICD-10-CM | POA: Diagnosis not present

## 2023-10-25 DIAGNOSIS — C52 Malignant neoplasm of vagina: Secondary | ICD-10-CM | POA: Diagnosis not present

## 2023-10-25 DIAGNOSIS — N823 Fistula of vagina to large intestine: Secondary | ICD-10-CM | POA: Diagnosis not present

## 2023-10-25 LAB — GASTROINTESTINAL PANEL BY PCR, STOOL (REPLACES STOOL CULTURE)

## 2023-10-25 LAB — CBC WITH DIFFERENTIAL/PLATELET
Abs Immature Granulocytes: 0.07 10*3/uL (ref 0.00–0.07)
Basophils Absolute: 0 10*3/uL (ref 0.0–0.1)
Basophils Relative: 0 %
Eosinophils Absolute: 0.1 10*3/uL (ref 0.0–0.5)
Eosinophils Relative: 0 %
HCT: 23.3 % — ABNORMAL LOW (ref 36.0–46.0)
Hemoglobin: 7.4 g/dL — ABNORMAL LOW (ref 12.0–15.0)
Immature Granulocytes: 1 %
Lymphocytes Relative: 5 %
Lymphs Abs: 0.5 10*3/uL — ABNORMAL LOW (ref 0.7–4.0)
MCH: 31.2 pg (ref 26.0–34.0)
MCHC: 31.8 g/dL (ref 30.0–36.0)
MCV: 98.3 fL (ref 80.0–100.0)
Monocytes Absolute: 0.6 10*3/uL (ref 0.1–1.0)
Monocytes Relative: 5 %
Neutro Abs: 10.6 10*3/uL — ABNORMAL HIGH (ref 1.7–7.7)
Neutrophils Relative %: 89 %
Platelets: 316 10*3/uL (ref 150–400)
RBC: 2.37 MIL/uL — ABNORMAL LOW (ref 3.87–5.11)
RDW: 14.9 % (ref 11.5–15.5)
WBC: 11.8 10*3/uL — ABNORMAL HIGH (ref 4.0–10.5)
nRBC: 0 % (ref 0.0–0.2)

## 2023-10-25 LAB — BASIC METABOLIC PANEL WITH GFR
Anion gap: 6 (ref 5–15)
BUN: 19 mg/dL (ref 8–23)
CO2: 26 mmol/L (ref 22–32)
Calcium: 8.6 mg/dL — ABNORMAL LOW (ref 8.9–10.3)
Chloride: 109 mmol/L (ref 98–111)
Creatinine, Ser: 0.8 mg/dL (ref 0.44–1.00)
GFR, Estimated: >60 mL/min (ref >60)
Glucose, Bld: 76 mg/dL (ref 70–99)
Potassium: 3.6 mmol/L (ref 3.5–5.1)
Sodium: 141 mmol/L (ref 135–145)

## 2023-10-25 NOTE — Progress Notes (Addendum)
 Pahwani sent msg. Patient and family requesting a regular diet.

## 2023-10-25 NOTE — Plan of Care (Signed)

## 2023-10-25 NOTE — Progress Notes (Signed)
 Gyn Progress Note  Subjective: Patient reports doing ok. Continues to have ongoing drainage. Bottom is raw. Diarrhea continues.    Objective: Vital signs in last 24 hours: Temp:  [97.8 F (36.6 C)-98.4 F (36.9 C)] 98.1 F (36.7 C) (04/30 1557) Pulse Rate:  [63-71] 71 (04/30 1557) Resp:  [16-18] 16 (04/30 1557) BP: (105-147)/(69-80) 105/74 (04/30 1557) SpO2:  [100 %] 100 % (04/30 1557) Last BM Date : 10/25/23  Intake/Output from previous day: 04/29 0701 - 04/30 0700 In: 720 [P.O.:720] Out: -   Physical Examination: Gen: alert, oriented, cachetic HEENT: normocephalic, atraumatic  Labs:    Latest Ref Rng & Units 10/25/2023    6:51 AM 10/24/2023    6:12 AM 10/23/2023    6:04 PM  CBC  WBC 4.0 - 10.5 K/uL 11.8  15.4  16.2   Hemoglobin 12.0 - 15.0 g/dL 7.4  8.6  9.8   Hematocrit 36.0 - 46.0 % 23.3  27.5  32.3   Platelets 150 - 400 K/uL 316  344  383       Latest Ref Rng & Units 10/25/2023    6:51 AM 10/24/2023    6:12 AM 10/23/2023    6:04 PM  BMP  Glucose 70 - 99 mg/dL 76  78  90   BUN 8 - 23 mg/dL 19  26  32   Creatinine 0.44 - 1.00 mg/dL 1.91  4.78  2.95   Sodium 135 - 145 mmol/L 141  142  143   Potassium 3.5 - 5.1 mmol/L 3.6  3.9  3.7   Chloride 98 - 111 mmol/L 109  106  107   CO2 22 - 32 mmol/L 26  27  23    Calcium 8.9 - 10.3 mg/dL 8.6  8.9  9.6    Assessment:  88 y.o. Stage III SCC of the vagina treated with primary radiation therapy, completed 01/2022.  Good response on post-treatment imaging. PET in 11/2022 concerning for local progression vs recurrence. PDL1 50%. Patient met with Dr. Marton Sleeper to discussed treatment options including single agent IO. Patient asymptomatic, declined treatment.  Enrolled on hospice in 2024.   Imaging now concerning for vesicovaginal and rectovaginal fistulas (functional cloacal defect).  Patient's granddaughter was at bedside (who was a Producer, television/film/video).  We reviewed recent CT scan which is concerning for a fistula between both the  vagina and the bladder as well as the vagina and the rectum.  The patient again asked if she was cancer free and I clarified similar discussions that we have had previously both by phone and in person that as of last year, imaging and exam findings were concerning for cancer recurrence.  She has declined treatment since that time.  On review of her imaging, it does not look like she has a colonic obstruction or partial obstruction but we discussed that sometimes diarrhea can be related to an obstruction where only loose stool is able to get through.  It actually looks to me like she has a fair amount of stool burden in her rectum and I think could benefit from addition of fiber both to help bulk the stool but also to help move stool through.  I suggested that the patient start Benefiber or Metamucil.  Infectious workup for diarrhea has been negative.  We also discussed addition of diarrheal agents to see if this helps provide any relief though I worry some about her getting constipated.  I suspect that some of what she is describing as diarrhea is discharge  or drainage from her apical vaginal tumor.  She endorses significant perineal discomfort related to frequent cleaning due to discharge and stool leakage/diarrhea.  I reviewed options for both cleaning as well as serving as a skin barrier.  I reiterated conversation that Dr. Daisey Dryer had with the patient previously about the option of diverting ostomy for treatment of the rectovaginal fistula.  Patient is not interested in such a procedure.  Summary: Discussed plan for discharge back to home with hospice, hopefully tomorrow. Reviewed options for bulking stool and barrier treatment for perineum (both placed in discharge instructions).   LOS: 1 day    Suzi Essex 10/25/2023, 7:43 PM

## 2023-10-25 NOTE — Discharge Instructions (Signed)
 Discussion agents to help with diarrhea: - benefiber or metamucil - immodium - lomotil  Barrier agents: - zinc oxide - desitin - vaseline - corn starch

## 2023-10-25 NOTE — Progress Notes (Addendum)
 PROGRESS NOTE    Megan Ryan  BJY:[id number] DOB: [birth year] DOA: 10/23/2023 PCP: Charle Congo, MD   Brief Narrative:  HPI:  Megan Ryan is a 88 y.o. 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 Report that she is having ongoing diarrhea with associated crampy abdominal pain, losing weight, dark concentrated urine, foul-smelling urine.  Patient denies any fever and chill.     ED Course:  At presentation to ED patient is borderline hypotensive otherwise hemodynamically stable. C. difficile negative. Pending GI panel. CMP showing elevated BUN 3.2 otherwise unremarkable. CBC showing leukocytosis 16.2, stable H&H 9.8 and 32.  Normal platelet count. UA showing evidence of UTI.  Pending urine culture.  She was diagnosed with  rectovaginalvesico fistula. Associated infiltrative mass and/or inflammation about the rectum, vagina, and bladder  Assessment & Plan:   Principal Problem:   Rrectovaginalvesico fistula Active Problems:   Chronic diarrhea   Acute cystitis   Essential hypertension   Non-insulin  dependent type 2 diabetes mellitus ([name])   Intention tremor   Vaginal cancer ([name])   History of DVT (deep vein thrombosis)   Chronic anemia   Rectovesical fistula  Rectovaginalvesico fistula History recurrent vaginal cancer s/pc radiation currently on hospice care Chronic diarrhea Acute cystitis/UTI Left-sided hydroureteronephrosis Per daughter, the patient has relapse of vaginal cancer 6 months away of past radiation and during that time patient decided not to pursue further treatment due to advanced age and she has been transition to home  hospice care eventually and during that time all medications has been discontinued including Eliquis , insulin  and blood pressure regimen.  Admitted with ongoing diarrhea and was diagnosed with UTI and rectovaginalvesico fistula.  Patient was admitted to hospital service per oncology recommendations for symptomatic treatment.  Started on Rocephin , urine cultures growing gram-negative rods, final identification and sensitivity pending.  She was seen by GYN oncologist Dr. Daisey Dryer yesterday who started her on Imodium and Metamucil since she was ruled out of infectious etiology of diarrhea.  Wound care was also consulted for his recommendations.  Per daughter, diarrhea is improving but not completely resolved.  She said that Dr. Daisey Dryer told her that someone else from his team will follow-up with the patient today.  We are awaiting final recommendations from GYN oncology.   History of DVT of the left lower extremity Non-insulin -dependent DM type II Essential hypertension Intention tremor -Family reported given patient is on hospice care all treatment has been discontinued in 2024. -At this time family does not want to pursue any aggressive management except IV antibiotic and fluid.  Patient does not want any diverting colostomy.  GYN on board.  DVT prophylaxis: heparin injection 5,000 Units Start: 10/24/23 0600 SCDs Start: 10/24/23 0002 Place TED hose Start: 10/24/23 0002   Code Status: Limited: Do not attempt resuscitation (DNR) -DNR-LIMITED -Do Not Intubate/DNI   Family Communication: Daughter present at bedside.  Plan of care discussed with patient in length and he/she verbalized understanding and agreed with it.  Status is: Inpatient Remains inpatient appropriate because: Patient will be discharged when cleared by GYN oncology     Estimated body mass index is 15.66 kg/m as calculated from the following:   Height as of this encounter: 5\' 7"  (1.702  m).   Weight as of this encounter: 45.4 kg.     Nutritional Assessment: Body mass index is 15.66 kg/m.Aaron Aas Seen by dietician.  I agree with the assessment and plan as outlined below: Nutrition Status:        . Skin Assessment: I have examined the patient's skin and I agree with the wound assessment as performed by the wound care RN as outlined below:    Consultants:  GYN oncology and Perative care  Procedures:  As above  Antimicrobials:  Anti-infectives (From admission, onward)    Start     Dose/Rate Route Frequency Ordered Stop   10/25/23 2200  cefTRIAXone  (ROCEPHIN ) 1 g in sodium chloride  0.9 % 100 mL IVPB        1 g 200 mL/hr over 30 Minutes Intravenous Every 24 hours 10/24/23 0001 10/31/23 2159   10/23/23 2200  cefTRIAXone  (ROCEPHIN ) 1 g in sodium chloride  0.9 % 100 mL IVPB        1 g 200 mL/hr over 30 Minutes Intravenous  Once 10/23/23 2159 10/23/23 2357         Subjective: Patient seen and examined.  She has no complaints.  Daughter at the bedside.  Long discussion with the daughter about plan of care.  She is awaiting visit from GYN oncology today.  Patient with good moods and great attitude.  She said " it is good to be alive"  Objective: Vitals:   10/24/23 1618 10/24/23 2016 10/25/23 0500 10/25/23 0721  BP: 127/71 116/69 (!) 147/80 115/70  Pulse: 68 71 63 68  Resp: 17 18 16 16   Temp: 98.3 F (36.8 C) 97.8 F (36.6 C) 98.4 F (36.9 C) 98.4 F (36.9 C)  TempSrc: Oral Oral Oral Oral  SpO2: 100% 100% 100% 100%  Weight:      Height:        Intake/Output Summary (Last 24 hours) at 10/25/2023 1129 Last data filed at 10/24/2023 2140 Gross per 24 hour  Intake 600 ml  Output --  Net 600 ml   Filed Weights   10/23/23 1714  Weight: 45.4 kg    Examination:  General exam: Appears calm and comfortable, cachectic Respiratory system: Clear to auscultation. Respiratory effort normal. Cardiovascular system: S1 & S2 heard, RRR. No JVD, murmurs, rubs, gallops or clicks. No pedal edema. Gastrointestinal  system: Abdomen is nondistended, soft and nontender. No organomegaly or masses felt. Normal bowel sounds heard. Central nervous system: Alert and oriented. No focal neurological deficits. Extremities: Symmetric 5 x 5 power. Skin: No rashes, lesions or ulcers.  Psychiatry: Judgement and insight appear normal. Mood & affect appropriate.   Data Reviewed: I have personally reviewed following labs and imaging studies  CBC: Recent Labs  Lab 10/23/23 1804 10/24/23 0612 10/25/23 0651  WBC 16.2* 15.4* 11.8*  NEUTROABS 14.8*  --  10.6*  HGB 9.8* 8.6* 7.4*  HCT 32.3* 27.5* 23.3*  MCV 100.0 96.5 98.3  PLT 383 344 316   Basic Metabolic Panel: Recent Labs  Lab 10/23/23 1804 10/24/23 0612 10/25/23 0651  NA 143 142 141  K 3.7 3.9 3.6  CL 107 106 109  CO2 23 27 26   GLUCOSE 90 78 76  BUN 32* 26* 19  CREATININE 0.98 0.84 0.80  CALCIUM 9.6 8.9 8.6*   GFR: Estimated Creatinine Clearance: 36.2 mL/min (by C-G formula based on SCr of 0.8 mg/dL). Liver Function Tests: Recent Labs  Lab 10/23/23 1804 10/24/23 0612  AST 18 11*  ALT 15 11  ALKPHOS 78  59  BILITOT 0.8 0.6  PROT 6.7 5.8*  ALBUMIN 2.3* 1.7*   No results for input(s): "LIPASE", "AMYLASE" in the last 168 hours. No results for input(s): "AMMONIA" in the last 168 hours. Coagulation Profile: No results for input(s): "INR", "PROTIME" in the last 168 hours. Cardiac Enzymes: No results for input(s): "CKTOTAL", "CKMB", "CKMBINDEX", "TROPONINI" in the last 168 hours. BNP (last 3 results) No results for input(s): "PROBNP" in the last 8760 hours. HbA1C: No results for input(s): "HGBA1C" in the last 72 hours. CBG: No results for input(s): "GLUCAP" in the last 168 hours. Lipid Profile: No results for input(s): "CHOL", "HDL", "LDLCALC", "TRIG", "CHOLHDL", "LDLDIRECT" in the last 72 hours. Thyroid  Function Tests: No results for input(s): "TSH", "T4TOTAL", "FREET4", "T3FREE", "THYROIDAB" in the last 72 hours. Anemia Panel: No  results for input(s): "VITAMINB12", "FOLATE", "FERRITIN", "TIBC", "IRON", "RETICCTPCT" in the last 72 hours. Sepsis Labs: No results for input(s): "PROCALCITON", "LATICACIDVEN" in the last 168 hours.  Recent Results (from the past 240 hours)  C Difficile Quick Screen w PCR reflex     Status: None   Collection Time: 10/23/23  5:46 PM   Specimen: STOOL  Result Value Ref Range Status   C Diff antigen NEGATIVE NEGATIVE Final   C Diff toxin NEGATIVE NEGATIVE Final   C Diff interpretation No C. difficile detected.  Final    Comment: Performed at [facility], [address] [place], [place] [address]  Gastrointestinal Panel by PCR , Stool     Status: None   Collection Time: 10/23/23  5:46 PM   Specimen: Stool  Result Value Ref Range Status   Campylobacter species NOT DETECTED NOT DETECTED Final   Plesimonas shigelloides NOT DETECTED NOT DETECTED Final   Salmonella species NOT DETECTED NOT DETECTED Final   Yersinia enterocolitica NOT DETECTED NOT DETECTED Final   Vibrio species NOT DETECTED NOT DETECTED Final   Vibrio cholerae NOT DETECTED NOT DETECTED Final   Enteroaggregative E coli (EAEC) NOT DETECTED NOT DETECTED Final   Enteropathogenic E coli (EPEC) NOT DETECTED NOT DETECTED Final   Enterotoxigenic E coli (ETEC) NOT DETECTED NOT DETECTED Final   Shiga like toxin producing E coli (STEC) NOT DETECTED NOT DETECTED Final   Shigella/Enteroinvasive E coli (EIEC) NOT DETECTED NOT DETECTED Final   Cryptosporidium NOT DETECTED NOT DETECTED Final   Cyclospora cayetanensis NOT DETECTED NOT DETECTED Final   Entamoeba histolytica NOT DETECTED NOT DETECTED Final   Giardia lamblia NOT DETECTED NOT DETECTED Final   Adenovirus F40/41 NOT DETECTED NOT DETECTED Final   Astrovirus NOT DETECTED NOT DETECTED Final   Norovirus GI/GII NOT DETECTED NOT DETECTED Final   Rotavirus A NOT DETECTED NOT DETECTED Final   Sapovirus (I, II, IV, and V) NOT DETECTED NOT DETECTED Final    Comment: Performed at  [facility], [address] [place], [place] [address]  Urine Culture     Status: Abnormal (Preliminary result)   Collection Time: 10/23/23  7:03 PM   Specimen: Urine, Random  Result Value Ref Range Status   Specimen Description URINE, RANDOM  Final   Special Requests NONE Reflexed from [id number]  Final   Culture (A)  Final    >=100,000 COLONIES/mL GRAM NEGATIVE RODS CULTURE REINCUBATED FOR BETTER GROWTH Performed at [facility], [address], [place], [place] [address]    Report Status PENDING  Incomplete     Radiology Studies: CT ABDOMEN PELVIS WO CONTRAST Result Date: 10/23/2023 CLINICAL DATA:  Diarrhea for 30  days. On hospice for ovarian cancer. Dark foul-smelling urine. Weight loss over the past 2 months. EXAM: CT ABDOMEN AND PELVIS WITHOUT CONTRAST TECHNIQUE: Multidetector CT imaging of the abdomen and pelvis was performed following the standard protocol without IV contrast. RADIATION DOSE REDUCTION: This exam was performed according to the departmental dose-optimization program which includes automated exposure control, adjustment of the mA and/or kV according to patient size and/or use of iterative reconstruction technique. COMPARISON:  PET/CT 12/20/2022 and CT abdomen pelvis 11/10/2022 FINDINGS: Lower chest: Moderate pericardial effusion, increased from 11/10/2022. Hepatobiliary: Unremarkable noncontrast appearance of the liver. Biliary sludge. No biliary dilation. Pancreas: Unremarkable. Spleen: Unremarkable. Adrenals/Urinary Tract: Stable adrenal glands. There is new marked left hydroureteronephrosis. No radiopaque stone. Question soft tissue infiltration and urothelial thickening about the distal left ureter and UVJ (circa series 2/image 63). Diffuse wall thickening and perivesical stranding about the bladder greatest posteriorly. There is loss of the fat plane between the posterior bladder and vagina. Query vesicovaginal fistula. Gas within the anterior bladder.  Stomach/Bowel: Irregular wall thickening about the rectum greater on the left (circa series 2/image 68). This is increased compared to 11/10/2022. There is adjacent perirectal fat stranding. No bowel obstruction. Stomach is within normal limits. Vascular/Lymphatic: No definite adenopathy noting limitations of noncontrast exam. Aortic atherosclerotic calcification. Reproductive: Calcifications in the uterine fundus likely represent fibroids. Fecalized debris in the vagina with colovaginal fistula (circa series 6/image 57). Other: No free intraperitoneal air. Free fluid in the pelvis. No abscess. Anterior abdominal hernia repair. Body wall edema. Musculoskeletal: No acute fracture. IMPRESSION: 1. 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 Associated infiltrative mass and/or inflammation about the rectum, vagina, and bladder. 2. New marked left hydroureteronephrosis. Limited evaluation without IV contrast however there is the suggestion of urothelial thickening and soft tissue infiltration about the left UVJ. No urinary calculi. 3. Moderate pericardial effusion, increased from 11/10/2022. 4. Aortic Atherosclerosis (ICD10-I70.0). Electronically Signed   By: Rozell Cornet M.D.   On: 10/23/2023 20:36    Scheduled Meds:  feeding supplement  237 mL Oral BID BM   heparin  5,000 Units Subcutaneous Q8H   liver oil-zinc oxide   Topical BID   psyllium  1 packet Oral Daily   sodium chloride  flush  3 mL Intravenous Q12H   sodium chloride  flush  3 mL Intravenous Q12H   Continuous Infusions:  cefTRIAXone  (ROCEPHIN )  IV       LOS: 1 day   Modena Andes, MD [facility]  10/25/2023, 11:29 AM   *Please note that this is a verbal dictation therefore any spelling or grammatical errors are due to the "Dragon Medical One" system interpretation.  Please page via Amion and do not message via secure chat for urgent patient care matters. Secure chat can be used for non urgent patient  care matters.  How to contact the [facility] Attending or Consulting provider 7A - 7P or covering provider during after hours 7P -7A, for this patient?  Check the care team in [facility] and look for a) attending/consulting TR[facility] provider listed and b) the [facility] team listed. Page or secure chat 7A-7P. Log into [email or web address] and use [place]'s universal password to access. If you do not have the password, please contact the hospital operator. Locate the [facility] provider you are looking for under [facility] and page to a number that you can be directly reached. If you still have difficulty reaching the provider, please page the [facility] (Director on Call) for the Hospitalists listed on amion for assistance.

## 2023-10-25 NOTE — TOC Initial Note (Signed)
 Transition of Care (TOC) - Initial/Assessment Note   Spoke to patient at bedside. No family present. Called daughter Myrtle Atta [phone] no answer and voicemail box full.   Per chart patient from home with [facility] and plan to return home at discharge with [facility].   Spoke to Ider with [facility] . [employer] spoke to daughter [name] yesterday and [facility] will increase services once patient discharged from hospital. They will provide an aide 5 days a week and a nurse 2 times a week.   Will attempt to reach daughter again  Patient Details  Name: Megan Ryan MRN: [id number] Date of Birth: 1936/06/27  Transition [facility] Care [facility]) CM/SW Contact:    Terre Ferri, RN Phone Number: 10/25/2023, 2:49 PM  Clinical Narrative:                   Expected Discharge Plan: Home w Hospice Care Barriers to Discharge: Continued Medical Work up   Patient Goals and CMS Choice Patient states their goals for this hospitalization and ongoing recovery are:: to return home          Expected Discharge Plan and Services   Discharge Planning Services: CM Consult   Living arrangements for the past 2 months: Single Family Home                 DME Arranged: N/A                    Prior Living Arrangements/Services Living arrangements for the past 2 months: Single Family Home Lives with:: Adult Children Patient language and need for interpreter reviewed:: Yes Do you feel safe going back to the place where you live?: Yes      Need for Family Participation in Patient Care: Yes (Comment) Care giver support system in place?: Yes (comment)   Criminal Activity/Legal Involvement Pertinent to Current Situation/Hospitalization: No - Comment as needed  Activities of Daily Living   ADL Screening (condition at time of admission) Independently performs ADLs?: No Does the patient have a NEW difficulty with bathing/dressing/toileting/self-feeding that is expected to  last >3 days?: No Does the patient have a NEW difficulty with getting in/out of bed, walking, or climbing stairs that is expected to last >3 days?: No Does the patient have a NEW difficulty with communication that is expected to last >3 days?: No Is the patient deaf or have difficulty hearing?: Yes Does the patient have difficulty seeing, even when wearing glasses/contacts?: No Does the patient have difficulty concentrating, remembering, or making decisions?: Yes  Permission Sought/Granted   Permission granted to share information with : Yes, Verbal Permission Granted  Share Information with NAME: daughter Myrtle Atta [phone]  Permission granted to share info w AGENCY: [facility]        Emotional Assessment Appearance:: Appears stated age   Affect (typically observed): Appropriate Orientation: : Oriented to Self, Oriented to Place, Oriented to  Time, Oriented to Situation Alcohol / Substance Use: Not Applicable Psych Involvement: No (comment)  Admission diagnosis:  Malignant neoplasm of endocervix ([name]) [C53.0] Rectovaginal fistula [N82.3] Rectovesical fistula [N32.1] Patient Active Problem List   Diagnosis Date Noted   Rectovesical fistula 10/24/2023   Rrectovaginalvesico fistula 10/23/2023   Chronic diarrhea 10/23/2023   Acute cystitis 10/23/2023   History of DVT (deep vein thrombosis) 10/23/2023   Chronic anemia 10/23/2023   DVT, lower extremity, distal, acute, right ([name]) 12/14/2021   Anemia, chronic disease 12/14/2021   Vaginal bleeding  12/09/2021   Vaginal cancer ([name]) 10/02/2021   Intention tremor 06/29/2016   Chronic diastolic heart failure ([name]) 06/29/2016   Hypophosphatemia 06/29/2016   Urinary tract infection 06/29/2016   Sepsis ([name]) 06/27/2016   Acute respiratory failure with hypoxia ([name]) 06/27/2016   CAP (community acquired pneumonia) 06/27/2016   Hypokalemia 06/27/2016   Dehydration 06/27/2016   Essential hypertension 06/27/2016    Non-insulin  dependent type 2 diabetes mellitus ([name]) 06/27/2016   Cardiomegaly 06/27/2016   No blood products 06/27/2016   PCP:  Charle Congo, MD Pharmacy:   [facility] [address] ([place]), [place] - [address] [place]) [place] [address] Phone: [phone] Fax: [phone]     Social Drivers of Health (SDOH) Social History: SDOH Screenings   Food Insecurity: No Food Insecurity (10/24/2023)  Housing: Unknown (10/24/2023)  Transportation Needs: No Transportation Needs (10/24/2023)  Utilities: Not At Risk (10/24/2023)  Social Connections: Moderately Integrated (10/24/2023)  Tobacco Use: Low Risk  (10/23/2023)   SDOH Interventions:     Readmission Risk Interventions     No data to display

## 2023-10-26 ENCOUNTER — Other Ambulatory Visit (HOSPITAL_COMMUNITY): Payer: Self-pay

## 2023-10-26 DIAGNOSIS — N823 Fistula of vagina to large intestine: Secondary | ICD-10-CM | POA: Diagnosis not present

## 2023-10-26 DIAGNOSIS — N820 Vesicovaginal fistula: Secondary | ICD-10-CM

## 2023-10-26 LAB — CBC WITH DIFFERENTIAL/PLATELET
Abs Immature Granulocytes: 0.05 10*3/uL (ref 0.00–0.07)
Basophils Absolute: 0 10*3/uL (ref 0.0–0.1)
Basophils Relative: 0 %
Eosinophils Absolute: 0 10*3/uL (ref 0.0–0.5)
Eosinophils Relative: 0 %
HCT: 28.5 % — ABNORMAL LOW (ref 36.0–46.0)
Hemoglobin: 8.7 g/dL — ABNORMAL LOW (ref 12.0–15.0)
Immature Granulocytes: 1 %
Lymphocytes Relative: 6 %
Lymphs Abs: 0.6 10*3/uL — ABNORMAL LOW (ref 0.7–4.0)
MCH: 30.2 pg (ref 26.0–34.0)
MCHC: 30.5 g/dL (ref 30.0–36.0)
MCV: 99 fL (ref 80.0–100.0)
Monocytes Absolute: 0.6 10*3/uL (ref 0.1–1.0)
Monocytes Relative: 6 %
Neutro Abs: 9.3 10*3/uL — ABNORMAL HIGH (ref 1.7–7.7)
Neutrophils Relative %: 87 %
Platelets: 304 10*3/uL (ref 150–400)
RBC: 2.88 MIL/uL — ABNORMAL LOW (ref 3.87–5.11)
RDW: 14.6 % (ref 11.5–15.5)
WBC: 10.6 10*3/uL — ABNORMAL HIGH (ref 4.0–10.5)
nRBC: 0 % (ref 0.0–0.2)

## 2023-10-26 LAB — URINE CULTURE: Culture: >=100000 — AB

## 2023-10-26 MED ORDER — PSYLLIUM 95 % PO PACK: 1.0000 | PACK | Freq: Every day | ORAL | 0 refills | Status: AC

## 2023-10-26 MED ORDER — SULFAMETHOXAZOLE-TRIMETHOPRIM 800-160 MG PO TABS
1.0000 | ORAL_TABLET | Freq: Two times a day (BID) | ORAL | 0 refills | Status: AC
Start: 2023-10-26 — End: 2023-10-31
  Filled 2023-10-26: qty 10, 5d supply, fill #0

## 2023-10-26 MED ORDER — PSYLLIUM 95 % PO PACK
1.0000 | PACK | Freq: Every day | ORAL | 0 refills | Status: DC
Start: 2023-10-26 — End: 2023-10-26
  Filled 2023-10-26: qty 30, 30d supply, fill #0

## 2023-10-26 NOTE — Progress Notes (Signed)
 Mobility Specialist Progress Note:   10/26/23 1234  Mobility  Activity Ambulated with assistance to bathroom  Level of Assistance Minimal assist, patient does 75% or more  Assistive Device Front wheel walker  Distance Ambulated (ft) 15 ft  Activity Response Tolerated well  Mobility Referral Yes  Mobility visit 1 Mobility  Mobility Specialist Start Time (ACUTE ONLY) 1220  Mobility Specialist Stop Time (ACUTE ONLY) 1232  Mobility Specialist Time Calculation (min) (ACUTE ONLY) 12 min   Pt received in bed, agreeable to mobility. MinA to come to EOB with heavy use of chuck pad. MinA to stand. Pt requesting to use BR to urinate. CG to ambulate to BR with RW. No unsteadiness present. Pt denied any discomfort, asx throughout. Pt left in BR with RN and daughter present in room.   Megan Ryan  Mobility Specialist Please contact via [employer] office at [phone]

## 2023-10-26 NOTE — Progress Notes (Signed)
 Regis Captain to be D/C'd  per MD order.  Discussed with the patient and all questions fully answered.  Patient receive IV antibiotic before discharge. [facility] waiting to transport patient home.  VSS, Skin clean, dry and intact without evidence of skin break down, no evidence of skin tears noted.  IV catheter discontinued intact. Site without signs and symptoms of complications. Dressing and pressure applied.  An After Visit Summary was printed and given to the daughter.   D/c education completed with patient/family including follow up instructions, medication list, d/c activities limitations if indicated, with other d/c instructions as indicated by MD - patient able to verbalize understanding, all questions fully answered.   Patient instructed to return to ED, call 911, or call MD for any changes in condition.   Patient to be escorted via [facility].

## 2023-10-26 NOTE — TOC Progression Note (Addendum)
 Transition of Care (TOC) - Progression Note   Spoke to patient and daughter [name] at bedside.   Confined at discharge patient will go home with hospice through [facility].  Patient has DME at home already  Will need ambulance transport home.   Confined address  [address] with [facility] aware of discharge today.  [name] , patient, daughter and nurse aware  Patient Details  Name: Megan Ryan MRN: [id number] Date of Birth: 05-Apr-1936  Transition of [facility]) CM/SW Contact  Heatherly Stenner, Arturo Late, RN Phone Number: 10/26/2023, 10:39 AM  Clinical Narrative:       Expected Discharge Plan: Home w Hospice Care Barriers to Discharge: Continued Medical Work up  Expected Discharge Plan and Services   Discharge Planning Services: CM Consult   Living arrangements for the past 2 months: Single Family Home                 DME Arranged: N/A                     Social Determinants of Health (SDOH) Interventions SDOH Screenings   Food Insecurity: No Food Insecurity (10/24/2023)  Housing: Unknown (10/24/2023)  Transportation Needs: No Transportation Needs (10/24/2023)  Utilities: Not At Risk (10/24/2023)  Social Connections: Moderately Integrated (10/24/2023)  Tobacco Use: Low Risk  (10/23/2023)    Readmission Risk Interventions     No data to display

## 2023-10-26 NOTE — Discharge Summary (Addendum)
 Physician Discharge Summary  Megan Ryan WGN:[id number] DOB: [birth year] DOA: 10/23/2023  PCP: Charle Congo, MD  Admit date: 10/23/2023 Discharge date: 10/26/2023 30 Day Unplanned Readmission Risk Score    Flowsheet Row ED to Hosp-Admission (Current) from 10/23/2023 in [facility]  SURGICAL  30 Day Unplanned Readmission Risk Score (%) 14.44 Filed at 10/26/2023 0400       This score is the patient's risk of an unplanned readmission within 30 days of being discharged (0 -100%). The score is based on dignosis, age, lab data, medications, orders, and past utilization.   Low:  0-14.9   Medium: 15-21.9   High: 22-29.9   Extreme: 30 and above          Admitted From: Home Disposition: Home with hospice  Recommendations for Outpatient Follow-up:  Follow up with PCP in 1-2 weeks Please obtain BMP/CBC in one week Please follow up with your PCP on the following pending results: Unresulted Labs (From admission, onward)    None         Home Health: None Equipment/Devices: None  Discharge Condition: Stable CODE STATUS: Full code Diet recommendation: Regular  Subjective: Seen and examined.  No complaints.  Daughter at the bedside.  They are both in agreement with the discharge home with hospice today.  Brief/Interim Summary: HPI:  Megan Ryan is a 88 y.o. 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  associated crampy abdominal pain, losing weight, dark concentrated urine, foul-smelling urine.  Patient denies any fever and chill.   At presentation to ED patient is borderline hypotensive otherwise hemodynamically  stable.CBC showing leukocytosis 16.2, stable H&H 9.8 and 32.  Normal platelet count. UA showing evidence of UTI.  Pending urine culture.  She was diagnosed with  rectovaginalvesico fistula. Associated infiltrative mass and/or inflammation about the rectum, vagina, and bladder Admitted on hospital service per oncology recommendations.  Details below.   Rectovaginalvesico fistula History recurrent vaginal cancer s/pc radiation currently on hospice care Chronic diarrhea Acute cystitis/UTI Left-sided hydroureteronephrosis Per daughter, the patient has relapse of vaginal cancer 6 months away of past radiation and during that time patient decided not to pursue further treatment due to advanced age and she has been transition to home hospice care eventually and during that time all medications has been discontinued including Eliquis , insulin  and blood pressure regimen.  Admitted with ongoing diarrhea and was diagnosed with UTI and rectovaginalvesico fistula.  Started on Rocephin , urine cultures growing gram-negative rods, final identification and sensitivity pending.  She was seen by GYN oncologist Dr. Daisey Dryer who started her on Imodium  and Metamucil since she was ruled out of infectious etiology of diarrhea/C. difficile and GI pathogen panel negative.  Wound care was also consulted for his recommendations.  Per daughter, diarrhea is improving.  Patient declined any surgical intervention/diverting colostomy.  Patient and family has had detailed discussion with GYN oncology and since there is no other option, patient will need to continue conservative management at home and she has been cleared for discharge back to home with hospice and family in agreement as well. Patient is growing E. coli in the urine culture which is sensitive to Bactrim  DS and her renal function is stable so discharged  on 5 more days of Bactrim  DS.   History of DVT of the left lower extremity Non-insulin -dependent DM type II Essential  hypertension Intention tremor -Family reported given patient is on hospice care all treatment has been discontinued in 2024. -At this time family does not want to pursue any aggressive management except IV antibiotic and fluid.  Patient does not want any diverting colostomy.     Moderate to severe protein calorie malnutrition: Dietary supplements encouraged.  Discharge plan was discussed with patient and/or family member and they verbalized understanding and agreed with it.  Discharge Diagnoses:  Principal Problem:   Rrectovaginalvesico fistula Active Problems:   Chronic diarrhea   Acute cystitis   Essential hypertension   Non-insulin  dependent type 2 diabetes mellitus ([name])   Intention tremor   Vaginal cancer ([name])   History of DVT (deep vein thrombosis)   Chronic anemia   Rectovesical fistula   Vesicovaginal fistula    Discharge Instructions   Allergies as of 10/26/2023   No Known Allergies      Medication List     TAKE these medications    acetaminophen  325 MG tablet Commonly known as: TYLENOL  Take 650 mg by mouth daily as needed for mild pain (pain score 1-3) or moderate pain (pain score 4-6).   loperamide  1 MG/5ML solution Commonly known as: IMODIUM  Take 4 mg by mouth as needed for diarrhea or loose stools.   meclizine  25 MG tablet Commonly known as: ANTIVERT  Take 25 mg by mouth 3 (three) times daily as needed for dizziness.   ondansetron  4 MG disintegrating tablet Commonly known as: ZOFRAN -ODT Take 4 mg by mouth every 4 (four) hours as needed for nausea.   oxyCODONE  5 MG immediate release tablet Commonly known as: Oxy IR/ROXICODONE  Take 5 mg by mouth every 4 (four) hours as needed for severe pain (pain score 7-10).   polyethylene glycol 17 g packet Commonly known as: MIRALAX  / GLYCOLAX  Take 17 g by mouth every other day.   psyllium 95 % Pack Commonly known as: HYDROCIL/METAMUCIL Take 1 packet by mouth daily.   sulfamethoxazole -trimethoprim  800-160  MG tablet Commonly known as: BACTRIM  DS Take 1 tablet by mouth 2 (two) times daily for 5 days.        Follow-up Information     [facility]   Contact information: [address] [place] [address] [phone]         [facility] Follow up.   Contact information: [address], [place], [place] [address] Phone: [phone]        Charle Congo, MD Follow up in 1 week(s).   Specialty: Internal Medicine Contact information: [address] [place] [address] [phone]                No Known Allergies  Consultations: GYN oncology   Procedures/Studies: CT ABDOMEN PELVIS WO CONTRAST Result Date: 10/23/2023 CLINICAL DATA:  Diarrhea for 30 days. On hospice for ovarian cancer. Dark foul-smelling urine. Weight loss over the past 2 months. EXAM: CT ABDOMEN AND PELVIS WITHOUT CONTRAST TECHNIQUE: Multidetector CT imaging of the abdomen and pelvis was performed following the standard protocol without IV contrast. RADIATION DOSE REDUCTION: This exam was performed according to the departmental dose-optimization program which includes automated exposure control, adjustment of the mA and/or kV according to patient size and/or use of iterative reconstruction technique. COMPARISON:  PET/CT 12/20/2022 and CT abdomen pelvis 11/10/2022 FINDINGS: Lower chest: Moderate pericardial effusion, increased from 11/10/2022. Hepatobiliary: Unremarkable noncontrast appearance of the liver.  Biliary sludge. No biliary dilation. Pancreas: Unremarkable. Spleen: Unremarkable. Adrenals/Urinary Tract: Stable adrenal glands. There is new marked left hydroureteronephrosis. No radiopaque stone. Question soft tissue infiltration and urothelial thickening about the distal left ureter and UVJ (circa series 2/image 63). Diffuse wall thickening and perivesical stranding about the bladder greatest posteriorly. There is loss of the fat plane between the posterior bladder and vagina. Query  vesicovaginal fistula. Gas within the anterior bladder. Stomach/Bowel: Irregular wall thickening about the rectum greater on the left (circa series 2/image 68). This is increased compared to 11/10/2022. There is adjacent perirectal fat stranding. No bowel obstruction. Stomach is within normal limits. Vascular/Lymphatic: No definite adenopathy noting limitations of noncontrast exam. Aortic atherosclerotic calcification. Reproductive: Calcifications in the uterine fundus likely represent fibroids. Fecalized debris in the vagina with colovaginal fistula (circa series 6/image 57). Other: No free intraperitoneal air. Free fluid in the pelvis. No abscess. Anterior abdominal hernia repair. Body wall edema. Musculoskeletal: No acute fracture. IMPRESSION: 1. 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 Associated infiltrative mass and/or inflammation about the rectum, vagina, and bladder. 2. New marked left hydroureteronephrosis. Limited evaluation without IV contrast however there is the suggestion of urothelial thickening and soft tissue infiltration about the left UVJ. No urinary calculi. 3. Moderate pericardial effusion, increased from 11/10/2022. 4. Aortic Atherosclerosis (ICD10-I70.0). Electronically Signed   By: Rozell Cornet M.D.   On: 10/23/2023 20:36     Discharge Exam: Vitals:   10/26/23 0519 10/26/23 0750  BP: 113/75 122/69  Pulse: 75 70  Resp: 16 16  Temp: 98 F (36.7 C) 97.8 F (36.6 C)  SpO2: 98% 99%   Vitals:   10/25/23 2010 10/26/23 0439 10/26/23 0519 10/26/23 0750  BP: 110/69 (!) 150/91 113/75 122/69  Pulse: 74 77 75 70  Resp: 16 17 16 16   Temp: 98.2 F (36.8 C) 98.1 F (36.7 C) 98 F (36.7 C) 97.8 F (36.6 C)  TempSrc: Oral Oral Oral Oral  SpO2: 100% 100% 98% 99%  Weight:      Height:        General: Pt is alert, awake, not in acute distress, cachectic Cardiovascular: RRR, S1/S2 +, no rubs, no gallops Respiratory: CTA bilaterally, no wheezing, no  rhonchi Abdominal: Soft, NT, ND, bowel sounds + Extremities: no edema, no cyanosis    The results of significant diagnostics from this hospitalization (including imaging, microbiology, ancillary and laboratory) are listed below for reference.     Microbiology: Recent Results (from the past 240 hours)  C Difficile Quick Screen w PCR reflex     Status: None   Collection Time: 10/23/23  5:46 PM   Specimen: STOOL  Result Value Ref Range Status   C Diff antigen NEGATIVE NEGATIVE Final   C Diff toxin NEGATIVE NEGATIVE Final   C Diff interpretation No C. difficile detected.  Final    Comment: Performed at [facility], [address], [place], [place] [address]  Gastrointestinal Panel by PCR , Stool     Status: None   Collection Time: 10/23/23  5:46 PM   Specimen: Stool  Result Value Ref Range Status   Campylobacter species NOT DETECTED NOT DETECTED Final   Plesimonas shigelloides NOT DETECTED NOT DETECTED Final   Salmonella species NOT DETECTED NOT DETECTED Final   Yersinia enterocolitica NOT DETECTED NOT DETECTED Final   Vibrio species NOT DETECTED NOT DETECTED Final   Vibrio cholerae NOT DETECTED NOT DETECTED Final   Enteroaggregative E coli (EAEC) NOT DETECTED NOT DETECTED Final  Enteropathogenic E coli (EPEC) NOT DETECTED NOT DETECTED Final   Enterotoxigenic E coli (ETEC) NOT DETECTED NOT DETECTED Final   Shiga like toxin producing E coli (STEC) NOT DETECTED NOT DETECTED Final   Shigella/Enteroinvasive E coli (EIEC) NOT DETECTED NOT DETECTED Final   Cryptosporidium NOT DETECTED NOT DETECTED Final   Cyclospora cayetanensis NOT DETECTED NOT DETECTED Final   Entamoeba histolytica NOT DETECTED NOT DETECTED Final   Giardia lamblia NOT DETECTED NOT DETECTED Final   Adenovirus F40/41 NOT DETECTED NOT DETECTED Final   Astrovirus NOT DETECTED NOT DETECTED Final   Norovirus GI/GII NOT DETECTED NOT DETECTED Final   Rotavirus A NOT DETECTED NOT DETECTED Final   Sapovirus (I, II,  IV, and V) NOT DETECTED NOT DETECTED Final    Comment: Performed at [facility], [address], [place], [place] [address]  Urine Culture     Status: Abnormal   Collection Time: 10/23/23  7:03 PM   Specimen: Urine, Random  Result Value Ref Range Status   Specimen Description URINE, RANDOM  Final   Special Requests   Final    NONE Reflexed from [id number] Performed at [facility], [address] [place], [place] [address]    Culture (A)  Final    >=100,000 COLONIES/mL ESCHERICHIA COLI Confirmed Extended Spectrum Beta-Lactamase Producer (ESBL).  In bloodstream infections from ESBL organisms, carbapenems are preferred over piperacillin /tazobactam. They are shown to have a lower risk of mortality.    Report Status 10/26/2023 FINAL  Final   Organism ID, Bacteria ESCHERICHIA COLI (A)  Final      Susceptibility   Escherichia coli - MIC*    AMPICILLIN >=32 RESISTANT Resistant     CEFAZOLIN >=64 RESISTANT Resistant     CEFEPIME 16 RESISTANT Resistant     CEFTRIAXONE  >=64 RESISTANT Resistant     CIPROFLOXACIN >=4 RESISTANT Resistant     GENTAMICIN <=1 SENSITIVE Sensitive     IMIPENEM <=0.25 SENSITIVE Sensitive     NITROFURANTOIN <=16 SENSITIVE Sensitive     TRIMETH /SULFA  <=20 SENSITIVE Sensitive     AMPICILLIN/SULBACTAM >=32 RESISTANT Resistant     PIP/TAZO 8 SENSITIVE Sensitive ug/mL    * >=100,000 COLONIES/mL ESCHERICHIA COLI     Labs: BNP (last 3 results) No results for input(s): "BNP" in the last 8760 hours. Basic Metabolic Panel: Recent Labs  Lab 10/23/23 1804 10/24/23 0612 10/25/23 0651  NA 143 142 141  K 3.7 3.9 3.6  CL 107 106 109  CO2 23 27 26   GLUCOSE 90 78 76  BUN 32* 26* 19  CREATININE 0.98 0.84 0.80  CALCIUM 9.6 8.9 8.6*   Liver Function Tests: Recent Labs  Lab 10/23/23 1804 10/24/23 0612  AST 18 11*  ALT 15 11  ALKPHOS 78 59  BILITOT 0.8 0.6  PROT 6.7 5.8*  ALBUMIN 2.3* 1.7*   No results for input(s): "LIPASE", "AMYLASE" in the last  168 hours. No results for input(s): "AMMONIA" in the last 168 hours. CBC: Recent Labs  Lab 10/23/23 1804 10/24/23 0612 10/25/23 0651 10/26/23 1012  WBC 16.2* 15.4* 11.8* 10.6*  NEUTROABS 14.8*  --  10.6* 9.3*  HGB 9.8* 8.6* 7.4* 8.7*  HCT 32.3* 27.5* 23.3* 28.5*  MCV 100.0 96.5 98.3 99.0  PLT 383 344 316 304   Cardiac Enzymes: No results for input(s): "CKTOTAL", "CKMB", "CKMBINDEX", "TROPONINI" in the last 168 hours. BNP: Invalid input(s): "POCBNP" CBG: No results for input(s): "GLUCAP" in the last 168 hours. D-Dimer No results for input(s): "DDIMER" in  the last 72 hours. Hgb A1c No results for input(s): "HGBA1C" in the last 72 hours. Lipid Profile No results for input(s): "CHOL", "HDL", "LDLCALC", "TRIG", "CHOLHDL", "LDLDIRECT" in the last 72 hours. Thyroid  function studies No results for input(s): "TSH", "T4TOTAL", "T3FREE", "THYROIDAB" in the last 72 hours.  Invalid input(s): "FREET3" Anemia work up No results for input(s): "VITAMINB12", "FOLATE", "FERRITIN", "TIBC", "IRON", "RETICCTPCT" in the last 72 hours. Urinalysis    Component Value Date/Time   COLORURINE AMBER (A) 10/23/2023 1903   APPEARANCEUR TURBID (A) 10/23/2023 1903   LABSPEC 1.020 10/23/2023 1903   PHURINE 7.0 10/23/2023 1903   GLUCOSEU NEGATIVE 10/23/2023 1903   HGBUR MODERATE (A) 10/23/2023 1903   BILIRUBINUR NEGATIVE 10/23/2023 1903   KETONESUR 5 (A) 10/23/2023 1903   PROTEINUR 100 (A) 10/23/2023 1903   UROBILINOGEN 0.2 06/27/2020 1347   NITRITE NEGATIVE 10/23/2023 1903   LEUKOCYTESUR MODERATE (A) 10/23/2023 1903   Sepsis Labs Recent Labs  Lab 10/23/23 1804 10/24/23 0612 10/25/23 0651 10/26/23 1012  WBC 16.2* 15.4* 11.8* 10.6*   Microbiology Recent Results (from the past 240 hours)  C Difficile Quick Screen w PCR reflex     Status: None   Collection Time: 10/23/23  5:46 PM   Specimen: STOOL  Result Value Ref Range Status   C Diff antigen NEGATIVE NEGATIVE Final   C Diff toxin  NEGATIVE NEGATIVE Final   C Diff interpretation No C. difficile detected.  Final    Comment: Performed at [facility], [address] [place], [place] [address]  Gastrointestinal Panel by PCR , Stool     Status: None   Collection Time: 10/23/23  5:46 PM   Specimen: Stool  Result Value Ref Range Status   Campylobacter species NOT DETECTED NOT DETECTED Final   Plesimonas shigelloides NOT DETECTED NOT DETECTED Final   Salmonella species NOT DETECTED NOT DETECTED Final   Yersinia enterocolitica NOT DETECTED NOT DETECTED Final   Vibrio species NOT DETECTED NOT DETECTED Final   Vibrio cholerae NOT DETECTED NOT DETECTED Final   Enteroaggregative E coli (EAEC) NOT DETECTED NOT DETECTED Final   Enteropathogenic E coli (EPEC) NOT DETECTED NOT DETECTED Final   Enterotoxigenic E coli (ETEC) NOT DETECTED NOT DETECTED Final   Shiga like toxin producing E coli (STEC) NOT DETECTED NOT DETECTED Final   Shigella/Enteroinvasive E coli (EIEC) NOT DETECTED NOT DETECTED Final   Cryptosporidium NOT DETECTED NOT DETECTED Final   Cyclospora cayetanensis NOT DETECTED NOT DETECTED Final   Entamoeba histolytica NOT DETECTED NOT DETECTED Final   Giardia lamblia NOT DETECTED NOT DETECTED Final   Adenovirus F40/41 NOT DETECTED NOT DETECTED Final   Astrovirus NOT DETECTED NOT DETECTED Final   Norovirus GI/GII NOT DETECTED NOT DETECTED Final   Rotavirus A NOT DETECTED NOT DETECTED Final   Sapovirus (I, II, IV, and V) NOT DETECTED NOT DETECTED Final    Comment: Performed at [facility], [address], [place], [place] [address]  Urine Culture     Status: Abnormal   Collection Time: 10/23/23  7:03 PM   Specimen: Urine, Random  Result Value Ref Range Status   Specimen Description URINE, RANDOM  Final   Special Requests   Final    NONE Reflexed from [id number] Performed at [facility], [address] [place], [place] [address]    Culture (A)  Final    >=100,000 COLONIES/mL ESCHERICHIA  COLI Confirmed Extended Spectrum Beta-Lactamase Producer (ESBL).  In bloodstream infections from ESBL organisms, carbapenems are  preferred over piperacillin /tazobactam. They are shown to have a lower risk of mortality.    Report Status 10/26/2023 FINAL  Final   Organism ID, Bacteria ESCHERICHIA COLI (A)  Final      Susceptibility   Escherichia coli - MIC*    AMPICILLIN >=32 RESISTANT Resistant     CEFAZOLIN >=64 RESISTANT Resistant     CEFEPIME 16 RESISTANT Resistant     CEFTRIAXONE  >=64 RESISTANT Resistant     CIPROFLOXACIN >=4 RESISTANT Resistant     GENTAMICIN <=1 SENSITIVE Sensitive     IMIPENEM <=0.25 SENSITIVE Sensitive     NITROFURANTOIN <=16 SENSITIVE Sensitive     TRIMETH /SULFA  <=20 SENSITIVE Sensitive     AMPICILLIN/SULBACTAM >=32 RESISTANT Resistant     PIP/TAZO 8 SENSITIVE Sensitive ug/mL    * >=100,000 COLONIES/mL ESCHERICHIA COLI    FURTHER DISCHARGE INSTRUCTIONS:   Get Medicines reviewed and adjusted: Please take all your medications with you for your next visit with your Primary MD   Laboratory/radiological data: Please request your Primary MD to go over all hospital tests and procedure/radiological results at the follow up, please ask your Primary MD to get all Hospital records sent to his/her office.   In some cases, they will be blood work, cultures and biopsy results pending at the time of your discharge. Please request that your primary care M.D. goes through all the records of your hospital data and follows up on these results.   Also Note the following: If you experience worsening of your admission symptoms, develop shortness of breath, life threatening emergency, suicidal or homicidal thoughts you must seek medical attention immediately by calling 911 or calling your MD immediately  if symptoms less severe.   You must read complete instructions/literature along with all the possible adverse reactions/side effects for all the Medicines you take and that have  been prescribed to you. Take any new Medicines after you have completely understood and accpet all the possible adverse reactions/side effects.    Do not drive when taking Pain medications or sleeping medications (Benzodaizepines)   Do not take more than prescribed Pain, Sleep and Anxiety Medications. It is not advisable to combine anxiety,sleep and pain medications without talking with your primary care practitioner   Special Instructions: If you have smoked or chewed Tobacco  in the last 2 yrs please stop smoking, stop any regular Alcohol  and or any Recreational drug use.   Wear Seat belts while driving.   Please note: You were cared for by a hospitalist during your hospital stay. Once you are discharged, your primary care physician will handle any further medical issues. Please note that NO REFILLS for any discharge medications will be authorized once you are discharged, as it is imperative that you return to your primary care physician (or establish a relationship with a primary care physician if you do not have one) for your post hospital discharge needs so that they can reassess your need for medications and monitor your lab values  Time coordinating discharge: Over 30 minutes  SIGNED:   Modena Andes, MD  [facility] 10/26/2023, 12:19 PM *Please note that this is a verbal dictation therefore any spelling or grammatical errors are due to the "[employer]" system interpretation. If 7PM-7AM, please contact night-coverage [email or web address]

## 2023-10-26 NOTE — Progress Notes (Signed)
 AVS completed; copy placed with patient chart.

## 2023-10-26 NOTE — Plan of Care (Signed)
   Problem: Education: Goal: Knowledge of General Education information will improve Description Including pain rating scale, medication(s)/side effects and non-pharmacologic comfort measures Outcome: Progressing   Problem: Health Behavior/Discharge Planning: Goal: Ability to manage health-related needs will improve Outcome: Progressing

## 2023-12-20 ENCOUNTER — Inpatient Hospital Stay: Attending: Gynecologic Oncology | Admitting: Gynecologic Oncology

## 2023-12-20 DIAGNOSIS — C52 Malignant neoplasm of vagina: Secondary | ICD-10-CM

## 2023-12-20 NOTE — Progress Notes (Signed)
 Called the daughter per note on the schedule.  She let me know that her mother, the patient, passed on 6/6.  Support offered.  Comer Dollar MD Gynecologic Oncology  This encounter was created in error - please disregard.

## 2023-12-26 DEATH — deceased
